# Patient Record
Sex: Female | Born: 1990 | Race: White | Hispanic: No | Marital: Married | Smoking: Former smoker
Health system: Southern US, Community
[De-identification: ages and names within clinical notes are randomized; demographics above are authoritative.]

## PROBLEM LIST (undated history)

## (undated) DIAGNOSIS — M419 Scoliosis, unspecified: Secondary | ICD-10-CM

## (undated) DIAGNOSIS — M810 Age-related osteoporosis without current pathological fracture: Secondary | ICD-10-CM

## (undated) DIAGNOSIS — R569 Unspecified convulsions: Secondary | ICD-10-CM

## (undated) DIAGNOSIS — J45909 Unspecified asthma, uncomplicated: Secondary | ICD-10-CM

## (undated) DIAGNOSIS — G43909 Migraine, unspecified, not intractable, without status migrainosus: Secondary | ICD-10-CM

## (undated) DIAGNOSIS — R011 Cardiac murmur, unspecified: Secondary | ICD-10-CM

## (undated) DIAGNOSIS — Q860 Fetal alcohol syndrome (dysmorphic): Secondary | ICD-10-CM

## (undated) DIAGNOSIS — F419 Anxiety disorder, unspecified: Secondary | ICD-10-CM

## (undated) DIAGNOSIS — F32A Depression, unspecified: Secondary | ICD-10-CM

## (undated) HISTORY — DX: Depression, unspecified: F32.A

## (undated) HISTORY — DX: Anxiety disorder, unspecified: F41.9

## (undated) HISTORY — DX: Age-related osteoporosis without current pathological fracture: M81.0

## (undated) HISTORY — PX: WRIST SURGERY: SHX841

## (undated) HISTORY — DX: Scoliosis, unspecified: M41.9

## (undated) HISTORY — DX: Fetal alcohol syndrome (dysmorphic): Q86.0

## (undated) HISTORY — DX: Unspecified asthma, uncomplicated: J45.909

## (undated) HISTORY — PX: FRACTURE SURGERY: SHX138

## (undated) HISTORY — DX: Cardiac murmur, unspecified: R01.1

## (undated) HISTORY — DX: Unspecified convulsions: R56.9

## (undated) HISTORY — DX: Migraine, unspecified, not intractable, without status migrainosus: G43.909

## (undated) HISTORY — PX: TONSILECTOMY, ADENOIDECTOMY, BILATERAL MYRINGOTOMY AND TUBES: SHX2538

## (undated) HISTORY — PX: HAND SURGERY: SHX662

---

## 2018-12-28 DIAGNOSIS — Z8659 Personal history of other mental and behavioral disorders: Secondary | ICD-10-CM | POA: Insufficient documentation

## 2018-12-28 DIAGNOSIS — F909 Attention-deficit hyperactivity disorder, unspecified type: Secondary | ICD-10-CM | POA: Insufficient documentation

## 2019-10-12 ENCOUNTER — Encounter: Payer: Self-pay | Admitting: Family Medicine

## 2019-10-12 ENCOUNTER — Ambulatory Visit (INDEPENDENT_AMBULATORY_CARE_PROVIDER_SITE_OTHER): Payer: PPO | Admitting: Family Medicine

## 2019-10-12 ENCOUNTER — Other Ambulatory Visit: Payer: Self-pay

## 2019-10-12 VITALS — BP 116/67 | HR 74 | Temp 98.3°F | Ht <= 58 in | Wt 96.0 lb

## 2019-10-12 DIAGNOSIS — M810 Age-related osteoporosis without current pathological fracture: Secondary | ICD-10-CM | POA: Diagnosis not present

## 2019-10-12 DIAGNOSIS — J4540 Moderate persistent asthma, uncomplicated: Secondary | ICD-10-CM | POA: Diagnosis not present

## 2019-10-12 DIAGNOSIS — F419 Anxiety disorder, unspecified: Secondary | ICD-10-CM

## 2019-10-12 DIAGNOSIS — Z7689 Persons encountering health services in other specified circumstances: Secondary | ICD-10-CM

## 2019-10-12 MED ORDER — ALBUTEROL SULFATE HFA 108 (90 BASE) MCG/ACT IN AERS: 2.0000 | INHALATION_SPRAY | Freq: Four times a day (QID) | RESPIRATORY_TRACT | 11 refills | Status: DC | PRN

## 2019-10-12 MED ORDER — BUDESONIDE-FORMOTEROL FUMARATE 160-4.5 MCG/ACT IN AERO
2.0000 | INHALATION_SPRAY | Freq: Two times a day (BID) | RESPIRATORY_TRACT | 11 refills | Status: DC
Start: 2019-10-12 — End: 2020-11-11

## 2019-10-12 NOTE — Progress Notes (Signed)
BP 116/67   Pulse 74   Temp 98.3 F (36.8 C) (Oral)   Ht 4\' 9"  (1.448 m)   Wt 96 lb (43.5 kg)   SpO2 99%   BMI 20.77 kg/m    Subjective:    Patient ID: , female    DOB: 03/12/1991, 29 y.o.   MRN: [id number]  HPI: Stacy Taylor is a 29 y.o. female  Chief Complaint  Patient presents with  . Establish Care  . Referral     to endocrinologist   Presenting today to establish care.  Osteoporosis - dianosed last year after falling and shattering both bones in left wrist. Prior to that had fractured the right wrist in a fall. Had DXA last year after fracture which was the time of dx. Needing referral to Endocrinology. Has been on forteo daily for about a year now. Due for recheck DXA in 1 year.   Had pap smear last year through her previous Gynecologist. Has IUD in place.   Has seasons where her asthma is worse than others. Uses symbicort and prn albuterol.   Hx of anxiety, on gabapentin and hydroxyzine prn. This is followed by Psychiatry.   Depression screen PHQ 2/9 10/12/2019  Decreased Interest 0  Down, Depressed, Hopeless 1  PHQ - 2 Score 1  Altered sleeping 3  Tired, decreased energy 1  Change in appetite 3  Feeling bad or failure about yourself  2  Trouble concentrating 3  Moving slowly or fidgety/restless 2  Suicidal thoughts 0  PHQ-9 Score 15  Difficult doing work/chores Somewhat difficult   GAD 7 : Generalized Anxiety Score 10/12/2019  Nervous, Anxious, on Edge 3  Control/stop worrying 2  Worry too much - different things 3  Trouble relaxing 1  Restless 2  Easily annoyed or irritable 2  Afraid - awful might happen 3  Total GAD 7 Score 16  Anxiety Difficulty Somewhat difficult   Relevant past medical, surgical, family and social history reviewed and updated as indicated. Interim medical history since our last visit reviewed. Allergies and medications reviewed and updated.  Review of Systems  Per HPI unless specifically indicated above       Objective:    BP 116/67   Pulse 74   Temp 98.3 F (36.8 C) (Oral)   Ht 4\' 9"  (1.448 m)   Wt 96 lb (43.5 kg)   SpO2 99%   BMI 20.77 kg/m   Wt Readings from Last 3 Encounters:  10/12/19 96 lb (43.5 kg)    Physical Exam Vitals and nursing note reviewed.  Constitutional:      Appearance: Normal appearance. She is not ill-appearing.  HENT:     Head: Atraumatic.  Eyes:     Extraocular Movements: Extraocular movements intact.     Conjunctiva/sclera: Conjunctivae normal.  Cardiovascular:     Rate and Rhythm: Normal rate and regular rhythm.     Heart sounds: Normal heart sounds.  Pulmonary:     Effort: Pulmonary effort is normal.     Breath sounds: Normal breath sounds.  Musculoskeletal:        General: Normal range of motion.     Cervical back: Normal range of motion and neck supple.  Skin:    General: Skin is warm and dry.  Neurological:     Mental Status: She is alert and oriented to person, place, and time.  Psychiatric:        Mood and Affect: Mood normal.  Thought Content: Thought content normal.        Judgment: Judgment normal.     No results found for this or any previous visit.    Assessment & Plan:   Problem List Items Addressed This Visit      Respiratory   Asthma    Stable and well controlled, continue inhaler regimen      Relevant Medications   budesonide-formoterol (SYMBICORT) 160-4.5 MCG/ACT inhaler   albuterol (VENTOLIN HFA) 108 (90 Base) MCG/ACT inhaler     Musculoskeletal and Integument   Osteoporosis without current pathological fracture - Primary    Referral placed to establish with local Endocrinologist to manage her forteo injections       Relevant Medications   Teriparatide, Recombinant, (FORTEO) 620 MCG/2.48ML SOPN   Other Relevant Orders   Ambulatory referral to Endocrinology     Other   Anxiety   Relevant Medications   hydrOXYzine (ATARAX/VISTARIL) 50 MG tablet    Other Visit Diagnoses    Encounter to establish care            Follow up plan: Return for CPE.

## 2019-10-15 ENCOUNTER — Encounter: Payer: Self-pay | Admitting: Family Medicine

## 2019-10-17 DIAGNOSIS — J45909 Unspecified asthma, uncomplicated: Secondary | ICD-10-CM | POA: Insufficient documentation

## 2019-10-17 DIAGNOSIS — M810 Age-related osteoporosis without current pathological fracture: Secondary | ICD-10-CM | POA: Insufficient documentation

## 2019-10-17 DIAGNOSIS — F419 Anxiety disorder, unspecified: Secondary | ICD-10-CM | POA: Insufficient documentation

## 2019-10-17 NOTE — Assessment & Plan Note (Signed)
Stable and well controlled, continue inhaler regimen

## 2019-10-17 NOTE — Assessment & Plan Note (Signed)
Referral placed to establish with local Endocrinologist to manage her forteo injections

## 2019-10-24 ENCOUNTER — Telehealth: Payer: Self-pay | Admitting: Family Medicine

## 2019-10-24 NOTE — Telephone Encounter (Signed)
[facility] sent fax and needs Bone density report/Most Recent Vitamin D/Met B/CMP labs. I do not see any of these in the chart. Please let me know what I need to do next. Thanks.

## 2019-10-25 NOTE — Telephone Encounter (Signed)
It looks like she is a new patient with [place], and that she has been diagnosed with this in the past, they may be able to get these records from her previous doctor, but I do not think we have access to any of it.

## 2019-11-04 ENCOUNTER — Encounter: Payer: Self-pay | Admitting: Family Medicine

## 2019-11-06 ENCOUNTER — Encounter: Payer: Self-pay | Admitting: Family Medicine

## 2019-11-06 ENCOUNTER — Telehealth (INDEPENDENT_AMBULATORY_CARE_PROVIDER_SITE_OTHER): Payer: PPO | Admitting: Family Medicine

## 2019-11-06 DIAGNOSIS — G43009 Migraine without aura, not intractable, without status migrainosus: Secondary | ICD-10-CM

## 2019-11-06 MED ORDER — RIZATRIPTAN BENZOATE 10 MG PO TABS
10.0000 mg | ORAL_TABLET | ORAL | 1 refills | Status: DC | PRN
Start: 2019-11-06 — End: 2021-04-29

## 2019-11-06 NOTE — Progress Notes (Signed)
Temp 97.6 F (36.4 C) (Temporal)   Wt 95 lb (43.1 kg)   BMI 20.56 kg/m    Subjective:    Patient ID: Stacy Taylor, female    DOB: 03/15/1991, 29 y.o.   MRN: [id number]  HPI: Stacy Taylor is a 29 y.o. female  Chief Complaint  Patient presents with  . Migraine   . This visit was completed via MyChart due to the restrictions of the COVID-19 pandemic. All issues as above were discussed and addressed. Physical exam was done as above through visual confirmation on MyChart. If it was felt that the patient should be evaluated in the office, they were directed there. The patient verbally consented to this visit. . Location of the patient: home . Location of the provider: work . Those involved with this call:  . Provider: Roosvelt Maser, PA-C . CMA: Elton Sin, CMA . Front Desk/Registration: Adela Ports  . Time spent on call: 15 minutes with patient face to face via video conference. More than 50% of this time was spent in counseling and coordination of care. 5 minutes total spent in review of patient's record and preparation of their chart. I verified patient identity using two factors (patient name and date of birth). Patient consents verbally to being seen via telemedicine visit today.   Hx of migraines, always sporadic and not lasting more than a day or so but severe when occurring. Had never gone to doctor for them because they hadn't lasted more than a day or so at a time. Current episode severe x 2 days with sound and light sensitivity, lightheadedness. Pain is front left side of face. Tried tylenol, gabapentin without relief.   Relevant past medical, surgical, family and social history reviewed and updated as indicated. Interim medical history since our last visit reviewed. Allergies and medications reviewed and updated.  Review of Systems  Per HPI unless specifically indicated above     Objective:    Temp 97.6 F (36.4 C) (Temporal)   Wt 95 lb (43.1 kg)   BMI 20.56  kg/m   Wt Readings from Last 3 Encounters:  11/06/19 95 lb (43.1 kg)  10/12/19 96 lb (43.5 kg)    Physical Exam Vitals and nursing note reviewed.  Constitutional:      General: She is not in acute distress.    Appearance: Normal appearance.  HENT:     Head: Atraumatic.     Right Ear: External ear normal.     Left Ear: External ear normal.     Nose: Nose normal. No congestion.     Mouth/Throat:     Mouth: Mucous membranes are moist.     Pharynx: Oropharynx is clear. No posterior oropharyngeal erythema.  Eyes:     Extraocular Movements: Extraocular movements intact.     Conjunctiva/sclera: Conjunctivae normal.  Cardiovascular:     Comments: Unable to assess via virtual visit Pulmonary:     Effort: Pulmonary effort is normal. No respiratory distress.  Musculoskeletal:        General: Normal range of motion.     Cervical back: Normal range of motion.  Skin:    General: Skin is dry.     Findings: No erythema.  Neurological:     Mental Status: She is alert and oriented to person, place, and time.  Psychiatric:        Mood and Affect: Mood normal.        Thought Content: Thought content normal.        Judgment:  Judgment normal.     No results found for this or any previous visit.    Assessment & Plan:   Problem List Items Addressed This Visit      Cardiovascular and Mediastinum   Migraines    Prn maxalt sent, if this not helping significantly with sxs will follow up to discuss starting a preventative medication. Return precautions given      Relevant Medications   rizatriptan (MAXALT) 10 MG tablet       Follow up plan: Return if symptoms worsen or fail to improve.

## 2019-11-12 ENCOUNTER — Encounter: Payer: Self-pay | Admitting: Family Medicine

## 2019-11-12 DIAGNOSIS — G43909 Migraine, unspecified, not intractable, without status migrainosus: Secondary | ICD-10-CM | POA: Insufficient documentation

## 2019-11-12 NOTE — Assessment & Plan Note (Signed)
Prn maxalt sent, if this not helping significantly with sxs will follow up to discuss starting a preventative medication. Return precautions given

## 2019-11-16 ENCOUNTER — Ambulatory Visit (INDEPENDENT_AMBULATORY_CARE_PROVIDER_SITE_OTHER): Payer: PPO | Admitting: Family Medicine

## 2019-11-16 ENCOUNTER — Other Ambulatory Visit: Payer: Self-pay

## 2019-11-16 ENCOUNTER — Encounter: Payer: Self-pay | Admitting: Family Medicine

## 2019-11-16 VITALS — BP 86/61 | HR 74 | Temp 98.4°F | Wt 95.0 lb

## 2019-11-16 DIAGNOSIS — M79644 Pain in right finger(s): Secondary | ICD-10-CM | POA: Diagnosis not present

## 2019-11-16 NOTE — Progress Notes (Signed)
   BP (!) 86/61   Pulse 74   Temp 98.4 F (36.9 C) (Oral)   Wt 95 lb (43.1 kg)   SpO2 99%   BMI 20.56 kg/m    Subjective:    Patient ID: Stacy Taylor, female    DOB: 16-Jun-1990, 29 y.o.   MRN: [id number]  HPI: Stacy Taylor is a 29 y.o. female  Chief Complaint  Patient presents with  . Pain    right pinky. 2 days ago   Presenting today with several days of right pinky discoloration, swelling and severe pain. Initially was turning black purple and red with pain originating from fingernail. She finally soaked the finger in acetone last night and ripped the painful portion of nail off that had been bothering her. Woke up this morning and the finger was nearly back to normal with minimal pain, no discoloration and mild swelling. Denies fever, chills, sweats.   Relevant past medical, surgical, family and social history reviewed and updated as indicated. Interim medical history since our last visit reviewed. Allergies and medications reviewed and updated.  Review of Systems  Per HPI unless specifically indicated above     Objective:    BP (!) 86/61   Pulse 74   Temp 98.4 F (36.9 C) (Oral)   Wt 95 lb (43.1 kg)   SpO2 99%   BMI 20.56 kg/m   Wt Readings from Last 3 Encounters:  11/16/19 95 lb (43.1 kg)  11/06/19 95 lb (43.1 kg)  10/12/19 96 lb (43.5 kg)    Physical Exam Vitals and nursing note reviewed.  Constitutional:      Appearance: Normal appearance. She is not ill-appearing.  HENT:     Head: Atraumatic.  Eyes:     Extraocular Movements: Extraocular movements intact.     Conjunctiva/sclera: Conjunctivae normal.  Cardiovascular:     Rate and Rhythm: Normal rate and regular rhythm.     Pulses: Normal pulses.     Heart sounds: Normal heart sounds.     Comments: Normal capillary refill b/l UEs Pulmonary:     Effort: Pulmonary effort is normal.     Breath sounds: Normal breath sounds.  Musculoskeletal:        General: Normal range of motion.     Cervical  back: Normal range of motion and neck supple.  Skin:    General: Skin is warm and dry.     Comments: Distal portion of right 5th digit removed, no active discoloration, open wound, drainage. Minimally edematous and ttp  Neurological:     Mental Status: She is alert and oriented to person, place, and time.  Psychiatric:        Mood and Affect: Mood normal.        Thought Content: Thought content normal.        Judgment: Judgment normal.     No results found for this or any previous visit.    Assessment & Plan:   Problem List Items Addressed This Visit    None    Visit Diagnoses    Pain of finger of right hand    -  Primary   Much improved after removing problematic portion of nail at home. Warm epsom salt soaks, neosporin, elevate hand. F/u if worsening and will send over abx       Follow up plan: Return if symptoms worsen or fail to improve.

## 2019-11-29 ENCOUNTER — Telehealth: Payer: Self-pay | Admitting: Psychiatry

## 2019-12-13 DIAGNOSIS — E559 Vitamin D deficiency, unspecified: Secondary | ICD-10-CM | POA: Diagnosis not present

## 2019-12-13 DIAGNOSIS — M818 Other osteoporosis without current pathological fracture: Secondary | ICD-10-CM | POA: Diagnosis not present

## 2019-12-14 DIAGNOSIS — M810 Age-related osteoporosis without current pathological fracture: Secondary | ICD-10-CM | POA: Diagnosis not present

## 2019-12-18 ENCOUNTER — Encounter: Payer: Self-pay | Admitting: Psychiatry

## 2019-12-18 ENCOUNTER — Other Ambulatory Visit: Payer: Self-pay

## 2019-12-18 ENCOUNTER — Telehealth (INDEPENDENT_AMBULATORY_CARE_PROVIDER_SITE_OTHER): Payer: PPO | Admitting: Psychiatry

## 2019-12-18 ENCOUNTER — Telehealth: Payer: Self-pay

## 2019-12-18 DIAGNOSIS — Z9189 Other specified personal risk factors, not elsewhere classified: Secondary | ICD-10-CM | POA: Diagnosis not present

## 2019-12-18 DIAGNOSIS — F250 Schizoaffective disorder, bipolar type: Secondary | ICD-10-CM | POA: Diagnosis not present

## 2019-12-18 DIAGNOSIS — F419 Anxiety disorder, unspecified: Secondary | ICD-10-CM

## 2019-12-18 DIAGNOSIS — F17200 Nicotine dependence, unspecified, uncomplicated: Secondary | ICD-10-CM

## 2019-12-18 DIAGNOSIS — Z79899 Other long term (current) drug therapy: Secondary | ICD-10-CM

## 2019-12-18 MED ORDER — HYDROXYZINE HCL 50 MG PO TABS: 50.0000 mg | ORAL_TABLET | Freq: Every day | ORAL | 1 refills | Status: DC

## 2019-12-18 MED ORDER — QUETIAPINE FUMARATE 25 MG PO TABS: 25.0000 mg | ORAL_TABLET | Freq: Every day | ORAL | 1 refills | Status: DC
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
## 2019-12-18 NOTE — Patient Instructions (Signed)
Quetiapine tablets What is this medicine? QUETIAPINE (kwe TYE a peen) is an antipsychotic. It is used to treat schizophrenia and bipolar disorder, also known as manic-depression. This medicine may be used for other purposes; ask your health care provider or pharmacist if you have questions. COMMON BRAND NAME(S): Seroquel What should I tell my health care provider before I take this medicine? They need to know if you have any of these conditions:  blockage in your bowel  cataracts  constipation  dehydration  diabetes  difficulty swallowing  glaucoma  heart disease  history of breast cancer  kidney disease  liver disease  low blood counts, like low white cell, platelet, or red cell counts  low blood pressure or dizziness when standing up  Parkinson's disease  previous heart attack  prostate disease  seizures  stomach or intestine problems  suicidal thoughts, plans or attempt; a previous suicide attempt by you or a family member  thyroid disease  trouble passing urine  an unusual or allergic reaction to quetiapine, other medicines, foods, dyes, or preservatives  pregnant or trying to get pregnant  breast-feeding How should I use this medicine? Take this medicine by mouth. Swallow it with a drink of water. Follow the directions on the prescription label. If it upsets your stomach you can take it with food. Take your medicine at regular intervals. Do not take it more often than directed. Do not stop taking except on the advice of your doctor or health care professional. A special MedGuide will be given to you by the pharmacist with each prescription and refill. Be sure to read this information carefully each time. Talk to your pediatrician regarding the use of this medicine in children. While this drug may be prescribed for children as young as 10 years for selected conditions, precautions do apply. Patients over age 65 years may have a stronger reaction to this  medicine and need smaller doses. Overdosage: If you think you have taken too much of this medicine contact a poison control center or emergency room at once. NOTE: This medicine is only for you. Do not share this medicine with others. What if I miss a dose? If you miss a dose, take it as soon as you can. If it is almost time for your next dose, take only that dose. Do not take double or extra doses. What may interact with this medicine? Do not take this medicine with any of the following medications:  cisapride  dronedarone  fluconazole  metoclopramide  pimozide  posaconazole  thioridazine This medicine may also interact with the following medications:  alcohol  antihistamines for allergy cough and cold  antiviral medicines for HIV or AIDS  atropine  certain medicines for bladder problems like oxybutynin, tolterodine  certain medicines for blood pressure  certain medicines for depression, anxiety, or psychotic disturbances  certain medicines for diabetes  certain medicines for stomach problems like dicyclomine, hyoscyamine  certain medicines for travel sickness like scopolamine  certain medicines for Parkinson's disease  certain medicines for seizures like carbamazepine, phenobarbital, phenytoin  cimetidine  erythromycin  ipratropium  other medicines that prolong the QT interval (cause an abnormal heart rhythm) like dofetilide  rifampin  steroid medicines like prednisone or cortisone This list may not describe all possible interactions. Give your health care provider a list of all the medicines, herbs, non-prescription drugs, or dietary supplements you use. Also tell them if you smoke, drink alcohol, or use illegal drugs. Some items may interact with your medicine. What   should I watch for while using this medicine? Visit your health care professional for regular checks on your progress. Tell your health care professional if symptoms do not start to get  better or if they get worse. Do not stop taking except on your health care professional's advice. You may develop a severe reaction. Your health care professional will tell you how much medicine to take. You may need to have an eye exam before and during use of this medicine. This medicine may increase blood sugar. Ask your health care provider if changes in diet or medicines are needed if you have diabetes. Patients and their families should watch out for new or worsening depression or thoughts of suicide. Also watch out for sudden or severe changes in feelings such as feeling anxious, agitated, panicky, irritable, hostile, aggressive, impulsive, severely restless, overly excited and hyperactive, or not being able to sleep. If this happens, especially at the beginning of antidepressant treatment or after a change in dose, call your health care professional. You may get dizzy or drowsy. Do not drive, use machinery, or do anything that needs mental alertness until you know how this medicine affects you. Do not stand or sit up quickly, especially if you are an older patient. This reduces the risk of dizzy or fainting spells. Alcohol may interfere with the effect of this medicine. Avoid alcoholic drinks. This drug can cause problems with controlling your body temperature. It can lower the response of your body to cold temperatures. If possible, stay indoors during cold weather. If you must go outdoors, wear warm clothes. It can also lower the response of your body to heat. Do not overheat. Do not over-exercise. Stay out of the sun when possible. If you must be in the sun, wear cool clothing. Drink plenty of water. If you have trouble controlling your body temperature, call your health care provider right away. What side effects may I notice from receiving this medicine? Side effects that you should report to your doctor or health care professional as soon as possible:  allergic reactions like skin rash,  itching or hives, swelling of the face, lips, or tongue  breathing problems  changes in vision  confusion  elevated mood, decreased need for sleep, racing thoughts, impulsive behavior  eye pain  fast, irregular heartbeat  fever or chills, sore throat  inability to keep still  males: prolonged or painful erection  problems with balance, talking, walking  redness, blistering, peeling, or loosening of the skin, including inside the mouth  seizures  signs and symptoms of high blood sugar such as being more thirsty or hungry or having to urinate more than normal. You may also feel very tired or have blurry vision  signs and symptoms of hypothyroidism like fatigue; increased sensitivity to cold; weight gain; hoarseness; thinning hair  signs and symptoms of low blood pressure like dizziness; feeling faint or lightheaded; falls; unusually weak or tired  signs and symptoms of neuroleptic malignant syndrome like confusion; fast, irregular heartbeat; high fever; increased sweating; stiff muscles  sudden numbness or weakness of the face, arm, or leg  suicidal thoughts or other mood changes  trouble swallowing  uncontrollable movements of the arms, face, head, mouth, neck, or upper body Side effects that usually do not require medical attention (report to your doctor or health care professional if they continue or are bothersome):  change in sex drive or performance  constipation  drowsiness  dry mouth  upset stomach  weight gain This list may   not describe all possible side effects. Call your doctor for medical advice about side effects. You may report side effects to FDA at 1-800-FDA-1088. Where should I keep my medicine? Keep out of the reach of children. Store at room temperature between 15 and 30 degrees C (59 and 86 degrees F). Throw away any unused medicine after the expiration date. NOTE: This sheet is a summary. It may not cover all possible information. If you  have questions about this medicine, talk to your doctor, pharmacist, or health care provider.  2020 Elsevier/Gold Standard (2019-02-08 11:59:11)  

## 2019-12-18 NOTE — Telephone Encounter (Signed)
lab work orders faxed and confirmed.  

## 2020-02-08 ENCOUNTER — Other Ambulatory Visit: Payer: Self-pay

## 2020-02-08 ENCOUNTER — Telehealth (INDEPENDENT_AMBULATORY_CARE_PROVIDER_SITE_OTHER): Payer: PPO | Admitting: Psychiatry

## 2020-02-08 ENCOUNTER — Encounter: Payer: Self-pay | Admitting: Psychiatry

## 2020-02-08 DIAGNOSIS — F419 Anxiety disorder, unspecified: Secondary | ICD-10-CM

## 2020-02-08 DIAGNOSIS — Z79899 Other long term (current) drug therapy: Secondary | ICD-10-CM

## 2020-02-08 DIAGNOSIS — Z9189 Other specified personal risk factors, not elsewhere classified: Secondary | ICD-10-CM

## 2020-02-08 DIAGNOSIS — F250 Schizoaffective disorder, bipolar type: Secondary | ICD-10-CM | POA: Diagnosis not present

## 2020-02-08 DIAGNOSIS — F17200 Nicotine dependence, unspecified, uncomplicated: Secondary | ICD-10-CM

## 2020-02-08 MED ORDER — QUETIAPINE FUMARATE 50 MG PO TABS: 50.0000 mg | ORAL_TABLET | Freq: Every day | ORAL | 0 refills | Status: DC

## 2020-02-08 MED ORDER — GABAPENTIN 400 MG PO CAPS: 400.0000 mg | ORAL_CAPSULE | Freq: Three times a day (TID) | ORAL | 0 refills | Status: DC

## 2020-02-08 MED ORDER — HYDROXYZINE HCL 50 MG PO TABS: 50.0000 mg | ORAL_TABLET | Freq: Every day | ORAL | 0 refills | Status: DC

## 2020-02-08 NOTE — Progress Notes (Signed)
Provider Location : [facility] Patient Location : Home  Participants: Patient , Provider  Virtual Visit via Video Note  I connected with Stacy Taylor on 02/08/20 at  2:00 PM EDT by a video enabled telemedicine application and verified that I am speaking with the correct person using two identifiers.   I discussed the limitations of evaluation and management by telemedicine and the availability of in person appointments. The patient expressed understanding and agreed to proceed.      I discussed the assessment and treatment plan with the patient. The patient was provided an opportunity to ask questions and all were answered. The patient agreed with the plan and demonstrated an understanding of the instructions.   The patient was advised to call back or seek an in-person evaluation if the symptoms worsen or if the condition fails to improve as anticipated.   BH MD OP Progress Note  02/08/2020 2:20 PM Shemiah Rosch  MRN:  [id number]  Chief Complaint:  Chief Complaint    Follow-up     HPI: Stacy Taylor is a 29 year old Caucasian female, recently relocated to [place] from [place], lives in [place], has a history of schizoaffective disorder bipolar type, anxiety disorder, migraine headaches, was evaluated by telemedicine today.   Patient today reports she is currently doing well with regards to her mood.  Denies any significant mood lability.  Denies any anxiety symptoms.  She describes her mood as happy.  She reports she recently got married and had a very good wedding.  Her whole family participated and she had a good time.  She reports sleep is better on the Seroquel.  She however reports she continues to wake up a few times at night and is interested in increasing her dosage.  Denies side effects to Seroquel.  She continues to have auditory hallucinations which are chronic.  She however reports she has gotten so used to it it does not bother her much.  She denies any  suicidality, homicidality or perceptual disturbances.  Patient reports she had a seizure recently.  This was observed by her husband.  She reports she never got a call from neurology.  She also has been noncompliant with the EKG as well as labs however agrees to get it done.  She has been cutting back on smoking cigarettes and is motivated to continue to do so.  Patient denies any other concerns today.   Visit Diagnosis:    ICD-10-CM   1. Schizoaffective disorder, bipolar type (HCC)  F25.0 QUEtiapine (SEROQUEL) 50 MG tablet    hydrOXYzine (ATARAX/VISTARIL) 50 MG tablet    gabapentin (NEURONTIN) 400 MG capsule  2. Anxiety disorder, unspecified type  F41.9 gabapentin (NEURONTIN) 400 MG capsule  3. Tobacco use disorder  F17.200   4. High risk medication use  Z79.899   5. At risk for long QT syndrome  Z91.89     Past Psychiatric History: I have reviewed past psychiatric history from my progress note on 12/18/2019.  Past trials of Zyprexa, gabapentin, multiple other medications-does not remember names.  Past Medical History:  Past Medical History:  Diagnosis Date  . Anxiety   . Asthma   . Depression   . Fetal alcohol syndrome   . Migraines   . Osteoporosis   . Scoliosis   . Seizures (HCC)     Past Surgical History:  Procedure Laterality Date  . FRACTURE SURGERY    . HAND SURGERY    . TONSILECTOMY, ADENOIDECTOMY, BILATERAL MYRINGOTOMY AND TUBES    .  WRIST SURGERY      Family Psychiatric History: I have reviewed family psychiatric history from my progress note on 12/18/2019  Family History:  Family History  Problem Relation Age of Onset  . Drug abuse Other   . Mental illness Other     Social History: I have reviewed social history from my progress note on 12/18/2019 Social History   Socioeconomic History  . Marital status: Single    Spouse name: Not on file  . Number of children: Not on file  . Years of education: Not on file  . Highest education level: Not on file   Occupational History  . Not on file  Tobacco Use  . Smoking status: Current Every Day Smoker    Packs/day: 0.25    Types: Cigarettes  . Smokeless tobacco: Never Used  . Tobacco comment: cutting back now- reported on 12/18/2019  Vaping Use  . Vaping Use: Never used  Substance and Sexual Activity  . Alcohol use: Yes    Comment: socially  . Drug use: Never  . Sexual activity: Yes  Other Topics Concern  . Not on file  Social History Narrative  . Not on file   Social Determinants of Health   Financial Resource Strain:   . Difficulty of Paying Living Expenses: Not on file  Food Insecurity:   . Worried About Programme researcher, broadcasting/film/videounning Out of Food in the Last Year: Not on file  . Ran Out of Food in the Last Year: Not on file  Transportation Needs:   . Lack of Transportation (Medical): Not on file  . Lack of Transportation (Non-Medical): Not on file  Physical Activity:   . Days of Exercise per Week: Not on file  . Minutes of Exercise per Session: Not on file  Stress:   . Feeling of Stress : Not on file  Social Connections:   . Frequency of Communication with Friends and Family: Not on file  . Frequency of Social Gatherings with Friends and Family: Not on file  . Attends Religious Services: Not on file  . Active Member of Clubs or Organizations: Not on file  . Attends BankerClub or Organization Meetings: Not on file  . Marital Status: Not on file    Allergies:  Allergies  Allergen Reactions  . Ibuprofen     Abdominal pain, upset stomach    Metabolic Disorder Labs: No results found for: HGBA1C, MPG No results found for: PROLACTIN No results found for: CHOL, TRIG, HDL, CHOLHDL, VLDL, LDLCALC No results found for: TSH  Therapeutic Level Labs: No results found for: LITHIUM No results found for: VALPROATE No components found for:  CBMZ  Current Medications: Current Outpatient Medications  Medication Sig Dispense Refill  . albuterol (VENTOLIN HFA) 108 (90 Base) MCG/ACT inhaler Inhale 2 puffs  into the lungs every 6 (six) hours as needed for wheezing or shortness of breath. 18 g 11  . budesonide-formoterol (SYMBICORT) 160-4.5 MCG/ACT inhaler Inhale 2 puffs into the lungs 2 (two) times daily. 1 Inhaler 11  . gabapentin (NEURONTIN) 400 MG capsule Take 1 capsule (400 mg total) by mouth 3 (three) times daily. 270 capsule 0  . hydrOXYzine (ATARAX/VISTARIL) 50 MG tablet Take 1 tablet (50 mg total) by mouth daily. At bed time 90 tablet 0  . levonorgestrel (LILETTA, 52 MG,) 19.5 MCG/DAY IUD IUD 1 each by Intrauterine route once.    . loratadine (CLARITIN) 10 MG tablet Claritin 10 mg tablet  Take 1 tablet every day by oral route as needed.    .Marland Kitchen  QUEtiapine (SEROQUEL) 50 MG tablet Take 1 tablet (50 mg total) by mouth at bedtime. 90 tablet 0  . rizatriptan (MAXALT) 10 MG tablet Take 1 tablet (10 mg total) by mouth as needed for migraine. May repeat in 2 hours if needed 10 tablet 1  . Teriparatide, Recombinant, (FORTEO) 620 MCG/2.48ML SOPN Inject into the skin daily. Injections    . Vitamin D, Ergocalciferol, (DRISDOL) 1.25 MG (50000 UNIT) CAPS capsule Take 50,000 Units by mouth once a week.     No current facility-administered medications for this visit.     Musculoskeletal: Strength & Muscle Tone: UTA Gait & Station: normal Patient leans: N/A  Psychiatric Specialty Exam: Review of Systems  Neurological: Positive for seizures.  Psychiatric/Behavioral: Positive for sleep disturbance.  All other systems reviewed and are negative.   There were no vitals taken for this visit.There is no height or weight on file to calculate BMI.  General Appearance: Casual  Eye Contact:  Fair  Speech:  Clear and Coherent  Volume:  Normal  Mood:  Euthymic  Affect:  Congruent  Thought Process:  Goal Directed and Descriptions of Associations: Intact  Orientation:  Full (Time, Place, and Person)  Thought Content: Hallucinations: Auditory chronic, conversations  Suicidal Thoughts:  No  Homicidal Thoughts:   No  Memory:  Immediate;   Fair Recent;   Fair Remote;   Fair  Judgement:  Fair  Insight:  Fair  Psychomotor Activity:  Normal  Concentration:  Concentration: Fair and Attention Span: Fair  Recall:  [employer] of Knowledge: Fair  Language: Fair  Akathisia:  No  Handed:  Right  AIMS (if indicated): UTA  Assets:  Communication Skills Desire for Improvement Housing Social Support  ADL's:  Intact  Cognition: WNL  Sleep:  Poor improving   Screenings: GAD-7     Office Visit from 10/12/2019 in [facility]  Total GAD-7 Score 16    PHQ2-9     Office Visit from 10/12/2019 in [facility]  PHQ-2 Total Score 1  PHQ-9 Total Score 15       Assessment and Plan: Stacy Taylor is a 29 year old Caucasian female, engaged, on SSI, lives in [place], has a history of migraine headaches, osteoporosis, seizure disorder versus pseudoseizure, schizoaffective disorder, was evaluated by telemedicine today.  Patient is biologically predisposed given her family history of mental health problems.  Patient also with history of trauma.  Patient is currently making progress on the current medication regimen however continues to have auditory hallucinations which are chronic as well as sleep issues.  Plan as noted below.  Plan Schizoaffective disorder-improving Increase Seroquel to 50 mg p.o. nightly. Gabapentin 400 mg p.o. 3 times daily. This was initially started by her providers for seizures.  However patient advised to continue it as a mood stabilizer.  Anxiety disorder unspecified-improving We will monitor closely.  Tobacco use disorder-improving Provided smoking cessation counseling.  High risk medication use-pending labs-TSH, lipid panel, hemoglobin A1c, prolactin, CBC with differential.  Patient encouraged to get labs done.  At risk for QT syndrome-patient was advised to get EKG. patient encouraged to get EKG done.  Patient will be referred to neurology for seizure  disorder versus pseudoseizures.  Follow-up in clinic in 4 weeks or sooner if needed.   I have spent atleast 20 minutes face to face by video with patient today. More than 50 % of the time was spent for preparing to see the patient ( e.g., review of test, records ),  ordering medications  and test ,psychoeducation and supportive psychotherapy and care coordination,as well as documenting clinical information in electronic health record. This note was generated in part or whole with voice recognition software. Voice recognition is usually quite accurate but there are transcription errors that can and very often do occur. I apologize for any typographical errors that were not detected and corrected.        Jomarie Longs, MD 02/08/2020, 2:20 PM

## 2020-02-16 ENCOUNTER — Other Ambulatory Visit: Payer: Self-pay

## 2020-02-16 ENCOUNTER — Ambulatory Visit (INDEPENDENT_AMBULATORY_CARE_PROVIDER_SITE_OTHER): Payer: PPO | Admitting: Family Medicine

## 2020-02-16 ENCOUNTER — Encounter: Payer: Self-pay | Admitting: Family Medicine

## 2020-02-16 VITALS — BP 103/67 | HR 84 | Temp 98.8°F | Ht <= 58 in | Wt 89.8 lb

## 2020-02-16 DIAGNOSIS — Z136 Encounter for screening for cardiovascular disorders: Secondary | ICD-10-CM

## 2020-02-16 DIAGNOSIS — H6123 Impacted cerumen, bilateral: Secondary | ICD-10-CM

## 2020-02-16 DIAGNOSIS — H6982 Other specified disorders of Eustachian tube, left ear: Secondary | ICD-10-CM | POA: Diagnosis not present

## 2020-02-16 DIAGNOSIS — Z79899 Other long term (current) drug therapy: Secondary | ICD-10-CM

## 2020-02-16 LAB — BAYER DCA HB A1C WAIVED: HB A1C (BAYER DCA - WAIVED): 3.9 % (ref <7.0)

## 2020-02-16 NOTE — Progress Notes (Signed)
BP 103/67   Pulse 84   Temp 98.8 F (37.1 C) (Oral)   Ht '4\' 9"'  (1.448 m)   Wt 89 lb 12.8 oz (40.7 kg)   SpO2 100%   BMI 19.43 kg/m    Subjective:    Patient ID: Stacy Taylor, female    DOB: 1990/06/18, 29 y.o.   MRN: [id number]  HPI: Lameeka Schleifer is a 29 y.o. female  Chief Complaint  Patient presents with  . Ear Pain    Left Ear pain. X 1.5 weeks. Tried ear drops and peroxide. Headaches 2 x a day - and constant tinnitis.   . Screening for Psych Meds    EKG 112.    EAR PAIN Duration: 1.5 weeks Involved ear(s): left Severity:  mild  Quality:  Pressure  Fever: no Otorrhea: no Upper respiratory infection symptoms: no Pruritus: no Hearing loss: yes Water immersion no Using Q-tips: no Recurrent otitis media: no Status: stable Treatments attempted: peroxide, ear drop kit  Needs blood work and EKG for her psychiatrist. Ordered today. Will forward.  Relevant past medical, surgical, family and social history reviewed and updated as indicated. Interim medical history since our last visit reviewed. Allergies and medications reviewed and updated.  Review of Systems  HENT: Positive for ear pain and hearing loss. Negative for congestion, dental problem, drooling, ear discharge, facial swelling, mouth sores, nosebleeds, postnasal drip, rhinorrhea, sinus pressure, sinus pain, sneezing, sore throat, tinnitus, trouble swallowing and voice change.   Respiratory: Negative.   Cardiovascular: Negative.   Gastrointestinal: Negative.   Musculoskeletal: Negative.   Neurological: Negative.   Psychiatric/Behavioral: Negative.     Per HPI unless specifically indicated above     Objective:    BP 103/67   Pulse 84   Temp 98.8 F (37.1 C) (Oral)   Ht '4\' 9"'  (1.448 m)   Wt 89 lb 12.8 oz (40.7 kg)   SpO2 100%   BMI 19.43 kg/m   Wt Readings from Last 3 Encounters:  02/16/20 89 lb 12.8 oz (40.7 kg)  11/16/19 95 lb (43.1 kg)  11/06/19 95 lb (43.1 kg)    Physical Exam Vitals  and nursing note reviewed.  Constitutional:      General: She is not in acute distress.    Appearance: Normal appearance. She is not ill-appearing, toxic-appearing or diaphoretic.  HENT:     Head: Normocephalic and atraumatic.     Right Ear: External ear normal. There is impacted cerumen.     Left Ear: External ear normal. There is impacted cerumen.     Nose: Nose normal.     Mouth/Throat:     Mouth: Mucous membranes are moist.     Pharynx: Oropharynx is clear.  Eyes:     General: No scleral icterus.       Right eye: No discharge.        Left eye: No discharge.     Extraocular Movements: Extraocular movements intact.     Conjunctiva/sclera: Conjunctivae normal.     Pupils: Pupils are equal, round, and reactive to light.  Cardiovascular:     Rate and Rhythm: Normal rate and regular rhythm.     Pulses: Normal pulses.     Heart sounds: Normal heart sounds. No murmur heard.  No friction rub. No gallop.   Pulmonary:     Effort: Pulmonary effort is normal. No respiratory distress.     Breath sounds: Normal breath sounds. No stridor. No wheezing, rhonchi or rales.  Chest:  Chest wall: No tenderness.  Musculoskeletal:        General: Normal range of motion.     Cervical back: Normal range of motion and neck supple.  Skin:    General: Skin is warm and dry.     Capillary Refill: Capillary refill takes less than 2 seconds.     Coloration: Skin is not jaundiced or pale.     Findings: No bruising, erythema, lesion or rash.  Neurological:     General: No focal deficit present.     Mental Status: She is alert and oriented to person, place, and time. Mental status is at baseline.  Psychiatric:        Mood and Affect: Mood normal.        Behavior: Behavior normal.        Thought Content: Thought content normal.        Judgment: Judgment normal.     No results found for this or any previous visit.    Assessment & Plan:   Problem List Items Addressed This Visit    None    Visit  Diagnoses    Hearing loss due to cerumen impaction, bilateral    -  Primary   Ears flushed today with good results. Call with any concerns.    ETD (Eustachian tube dysfunction), left       If not 100% better by Monday will call in burst of prednisone. Call with any concerns.    Screening for heart disease       Needs EKG for her psychiatrist- done today.   Relevant Orders   EKG 12-Lead (Completed)   Long-term use of high-risk medication       Needs blood work for her psychiatrist- done today.   Relevant Orders   TSH   Lipid Panel w/o Chol/HDL Ratio   Bayer DCA Hb A1c Waived   CBC with Differential/Platelet   Comprehensive metabolic panel   Prolactin       Follow up plan: Return if symptoms worsen or fail to improve.

## 2020-02-16 NOTE — Progress Notes (Signed)
Interpreted by me today. NSR at 80bpm, No ST segment changes. QT 380. Will send copy to psychiatry

## 2020-02-17 LAB — COMPREHENSIVE METABOLIC PANEL
ALT: 10 IU/L (ref 0–32)
AST: 12 IU/L (ref 0–40)
Albumin/Globulin Ratio: 2.1 (ref 1.2–2.2)
Albumin: 4.7 g/dL (ref 3.9–5.0)
Alkaline Phosphatase: 73 IU/L (ref 44–121)
BUN/Creatinine Ratio: 26 — ABNORMAL HIGH (ref 9–23)
BUN: 20 mg/dL (ref 6–20)
Bilirubin Total: 0.8 mg/dL (ref 0.0–1.2)
CO2: 24 mmol/L (ref 20–29)
Calcium: 8.9 mg/dL (ref 8.7–10.2)
Chloride: 104 mmol/L (ref 96–106)
Creatinine, Ser: 0.78 mg/dL (ref 0.57–1.00)
GFR calc Af Amer: 118 mL/min/{1.73_m2} (ref >59)
GFR calc non Af Amer: 102 mL/min/{1.73_m2} (ref >59)
Globulin, Total: 2.2 g/dL (ref 1.5–4.5)
Glucose: 73 mg/dL (ref 65–99)
Potassium: 4 mmol/L (ref 3.5–5.2)
Sodium: 141 mmol/L (ref 134–144)
Total Protein: 6.9 g/dL (ref 6.0–8.5)

## 2020-02-17 LAB — LIPID PANEL W/O CHOL/HDL RATIO
Cholesterol, Total: 143 mg/dL (ref 100–199)
HDL: 48 mg/dL (ref >39)
LDL Chol Calc (NIH): 83 mg/dL (ref 0–99)
Triglycerides: 55 mg/dL (ref 0–149)
VLDL Cholesterol Cal: 12 mg/dL (ref 5–40)

## 2020-02-17 LAB — CBC WITH DIFFERENTIAL/PLATELET
Basophils Absolute: 0 10*3/uL (ref 0.0–0.2)
Basos: 1 %
EOS (ABSOLUTE): 0 10*3/uL (ref 0.0–0.4)
Eos: 0 %
Hematocrit: 40.4 % (ref 34.0–46.6)
Hemoglobin: 14.3 g/dL (ref 11.1–15.9)
Immature Grans (Abs): 0 10*3/uL (ref 0.0–0.1)
Immature Granulocytes: 1 %
Lymphocytes Absolute: 2.1 10*3/uL (ref 0.7–3.1)
Lymphs: 36 %
MCH: 32.9 pg (ref 26.6–33.0)
MCHC: 35.4 g/dL (ref 31.5–35.7)
MCV: 93 fL (ref 79–97)
Monocytes Absolute: 0.4 10*3/uL (ref 0.1–0.9)
Monocytes: 6 %
Neutrophils Absolute: 3.3 10*3/uL (ref 1.4–7.0)
Neutrophils: 56 %
Platelets: 128 10*3/uL — ABNORMAL LOW (ref 150–450)
RBC: 4.34 x10E6/uL (ref 3.77–5.28)
RDW: 12.8 % (ref 11.7–15.4)
WBC: 5.8 10*3/uL (ref 3.4–10.8)

## 2020-02-17 LAB — TSH: TSH: 0.785 u[IU]/mL (ref 0.450–4.500)

## 2020-02-17 LAB — PROLACTIN: Prolactin: 4 ng/mL — ABNORMAL LOW (ref 4.8–23.3)

## 2020-03-11 DIAGNOSIS — G43109 Migraine with aura, not intractable, without status migrainosus: Secondary | ICD-10-CM | POA: Diagnosis not present

## 2020-03-11 DIAGNOSIS — F445 Conversion disorder with seizures or convulsions: Secondary | ICD-10-CM | POA: Diagnosis not present

## 2020-03-11 DIAGNOSIS — R569 Unspecified convulsions: Secondary | ICD-10-CM | POA: Diagnosis not present

## 2020-03-14 ENCOUNTER — Telehealth (INDEPENDENT_AMBULATORY_CARE_PROVIDER_SITE_OTHER): Payer: PPO | Admitting: Psychiatry

## 2020-03-14 ENCOUNTER — Other Ambulatory Visit: Payer: Self-pay

## 2020-03-14 ENCOUNTER — Encounter: Payer: Self-pay | Admitting: Psychiatry

## 2020-03-14 DIAGNOSIS — F17200 Nicotine dependence, unspecified, uncomplicated: Secondary | ICD-10-CM | POA: Diagnosis not present

## 2020-03-14 DIAGNOSIS — F250 Schizoaffective disorder, bipolar type: Secondary | ICD-10-CM

## 2020-03-14 DIAGNOSIS — Z79899 Other long term (current) drug therapy: Secondary | ICD-10-CM | POA: Diagnosis not present

## 2020-03-14 MED ORDER — QUETIAPINE FUMARATE 50 MG PO TABS: 75.0000 mg | ORAL_TABLET | Freq: Every day | ORAL | 0 refills | Status: DC

## 2020-03-14 NOTE — Progress Notes (Signed)
Virtual Visit via Video Note  I connected with Stacy Taylor on 03/14/20 at  2:40 PM EST by a video enabled telemedicine application and verified that I am speaking with the correct person using two identifiers.  Location Provider Location : [facility] Patient Location : Home  Participants: Patient , Provider   I discussed the limitations of evaluation and management by telemedicine and the availability of in person appointments. The patient expressed understanding and agreed to proceed. I discussed the assessment and treatment plan with the patient. The patient was provided an opportunity to ask questions and all were answered. The patient agreed with the plan and demonstrated an understanding of the instructions.  The patient was advised to call back or seek an in-person evaluation if the symptoms worsen or if the condition fails to improve as anticipated.   BH MD OP Progress Note  03/14/2020 3:18 PM Stacy Taylor  MRN:  [id number]  Chief Complaint:  Chief Complaint    Follow-up     HPI: Stacy Taylor is a 29 year old Caucasian female, currently lives in [place], has a history of schizoaffective disorder, migraine headaches , seizure disorder was evaluated by telemedicine today.  Patient today reports that she has noticed mild depressive symptoms since the past few days.  She reports she usually has depressive episodes during the winter months.  When she was in [place] since the winter was colder  she used to get it earlier.  She reports however since being in [place] she has not had significant depressive symptoms and this is the first time she has felt some sadness and being down.  Patient reports sleep is good.  She denies any significant anxiety symptoms.  She is compliant on medications.  Denies side effects.  She denies any suicidality, homicidality or perceptual disturbances.  She had all her blood work-up as well as EKG completed.  Reviewed and discussed the same  with her.  She reports she is currently working at [employer] and reports work as going well.  She looks forward to Thanksgiving holidays and is planning to spend it with her family.  Visit Diagnosis:    ICD-10-CM   1. Schizoaffective disorder, bipolar type (HCC)  F25.0 QUEtiapine (SEROQUEL) 50 MG tablet  2. Tobacco use disorder  F17.200   3. High risk medication use  Z79.899     Past Psychiatric History: I have past psychiatric history from my progress note on 12/18/2019.  Past trials of Zyprexa, gabapentin, multiple other medications-does not remember names.  Past Medical History:  Past Medical History:  Diagnosis Date  . Anxiety   . Asthma   . Depression   . Fetal alcohol syndrome   . Migraines   . Osteoporosis   . Scoliosis   . Seizures (HCC)     Past Surgical History:  Procedure Laterality Date  . FRACTURE SURGERY    . HAND SURGERY    . TONSILECTOMY, ADENOIDECTOMY, BILATERAL MYRINGOTOMY AND TUBES    . WRIST SURGERY      Family Psychiatric History: I have reviewed family psychiatric history from my progress note on 12/18/2019  Family History:  Family History  Problem Relation Age of Onset  . Drug abuse Other   . Mental illness Other     Social History: Reviewed social history from my progress note on 12/18/2019 Social History   Socioeconomic History  . Marital status: Married    Spouse name: Not on file  . Number of children: Not on file  . Years of education:  Not on file  . Highest education level: Not on file  Occupational History  . Not on file  Tobacco Use  . Smoking status: Current Every Day Smoker    Packs/day: 0.25    Types: Cigarettes  . Smokeless tobacco: Never Used  . Tobacco comment: 1 cig per day reported 03/14/2020  Vaping Use  . Vaping Use: Never used  Substance and Sexual Activity  . Alcohol use: Yes    Comment: socially  . Drug use: Never  . Sexual activity: Yes  Other Topics Concern  . Not on file  Social History Narrative  . Not on  file   Social Determinants of Health   Financial Resource Strain:   . Difficulty of Paying Living Expenses: Not on file  Food Insecurity:   . Worried About Programme researcher, broadcasting/film/video in the Last Year: Not on file  . Ran Out of Food in the Last Year: Not on file  Transportation Needs:   . Lack of Transportation (Medical): Not on file  . Lack of Transportation (Non-Medical): Not on file  Physical Activity:   . Days of Exercise per Week: Not on file  . Minutes of Exercise per Session: Not on file  Stress:   . Feeling of Stress : Not on file  Social Connections:   . Frequency of Communication with Friends and Family: Not on file  . Frequency of Social Gatherings with Friends and Family: Not on file  . Attends Religious Services: Not on file  . Active Member of Clubs or Organizations: Not on file  . Attends Banker Meetings: Not on file  . Marital Status: Not on file    Allergies:  Allergies  Allergen Reactions  . Ibuprofen     Abdominal pain, upset stomach    Metabolic Disorder Labs: Lab Results  Component Value Date   HGBA1C 3.9 02/16/2020   Lab Results  Component Value Date   PROLACTIN 4.0 (L) 02/16/2020   Lab Results  Component Value Date   CHOL 143 02/16/2020   TRIG 55 02/16/2020   HDL 48 02/16/2020   LDLCALC 83 02/16/2020   Lab Results  Component Value Date   TSH 0.785 02/16/2020    Therapeutic Level Labs: No results found for: LITHIUM No results found for: VALPROATE No components found for:  CBMZ  Current Medications: Current Outpatient Medications  Medication Sig Dispense Refill  . albuterol (VENTOLIN HFA) 108 (90 Base) MCG/ACT inhaler Inhale 2 puffs into the lungs every 6 (six) hours as needed for wheezing or shortness of breath. 18 g 11  . budesonide-formoterol (SYMBICORT) 160-4.5 MCG/ACT inhaler Inhale 2 puffs into the lungs 2 (two) times daily. 1 Inhaler 11  . FLUARIX QUADRIVALENT 0.5 ML injection     . gabapentin (NEURONTIN) 400 MG  capsule Take 1 capsule (400 mg total) by mouth 3 (three) times daily. 270 capsule 0  . hydrOXYzine (ATARAX/VISTARIL) 50 MG tablet Take 1 tablet (50 mg total) by mouth daily. At bed time 90 tablet 0  . levonorgestrel (LILETTA, 52 MG,) 19.5 MCG/DAY IUD IUD 1 each by Intrauterine route once.    . loratadine (CLARITIN) 10 MG tablet Claritin 10 mg tablet  Take 1 tablet every day by oral route as needed.    Marland Kitchen QUEtiapine (SEROQUEL) 50 MG tablet Take 1.5-2 tablets (75-100 mg total) by mouth at bedtime. 180 tablet 0  . rizatriptan (MAXALT) 10 MG tablet Take 1 tablet (10 mg total) by mouth as needed for migraine. May repeat  in 2 hours if needed 10 tablet 1  . Teriparatide, Recombinant, (FORTEO) 620 MCG/2.48ML SOPN Inject into the skin daily. Injections    . Vitamin D, Ergocalciferol, (DRISDOL) 1.25 MG (50000 UNIT) CAPS capsule Take 50,000 Units by mouth once a week.     No current facility-administered medications for this visit.     Musculoskeletal: Strength & Muscle Tone: UTA Gait & Station: normal Patient leans: N/A  Psychiatric Specialty Exam: Review of Systems  Psychiatric/Behavioral: Positive for dysphoric mood.  All other systems reviewed and are negative.   There were no vitals taken for this visit.There is no height or weight on file to calculate BMI.  General Appearance: Casual  Eye Contact:  Fair  Speech:  Clear and Coherent  Volume:  Normal  Mood:  Depressed  Affect:  Congruent  Thought Process:  Goal Directed and Descriptions of Associations: Intact  Orientation:  Full (Time, Place, and Person)  Thought Content: Logical   Suicidal Thoughts:  No  Homicidal Thoughts:  No  Memory:  Immediate;   Fair Recent;   Fair Remote;   Fair  Judgement:  Fair  Insight:  Fair  Psychomotor Activity:  Normal  Concentration:  Concentration: Fair and Attention Span: Fair  Recall:  [employer] of Knowledge: Fair  Language: Fair  Akathisia:  No  Handed:  Right  AIMS (if indicated): UTA   Assets:  Communication Skills Desire for Improvement Housing Social Support  ADL's:  Intact  Cognition: WNL  Sleep:  Fair   Screenings: GAD-7     Office Visit from 10/12/2019 in [facility]  Total GAD-7 Score 16    PHQ2-9     Office Visit from 02/16/2020 in [facility] Office Visit from 10/12/2019 in [facility]  PHQ-2 Total Score 0 1  PHQ-9 Total Score 0 15       Assessment and Plan: Aayla Marrocco is a 29 year old Caucasian female, engaged, on SSI, lives in [place], has a history of migraine headaches, osteoporosis, 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  Patient is biologically predisposed given her family history of mental health problems.  Patient with history of trauma.  Patient is currently struggling with mild depressive symptoms and will benefit from the following medication changes.  Plan as noted below.  Plan Schizoaffective disorder-unstable Increase Seroquel to 75 mg p.o. nightly.  If she tolerates a 75 mg well she could go up to 100 mg in a week from now. Gabapentin 400 mg p.o. 3 times daily This was initially started for seizures by her providers.  We will continue the same for mood stabilization.   Tobacco use disorder-improving She is currently cutting back.   I have reviewed and discussed the following labs with patient-dated 02/16/2020-TSH-within normal limits, lipid panel-within normal limits, CBC with differential-within normal limits except for platelet count-low at 128.  CMP-within normal limits, prolactin low at 4. Patient will communicate with her primary care provider regarding her low platelet count.  Unknown if this is chronic. Patient is on medications like gabapentin which can cause low platelet counts.  I have also reviewed EKG, 02/16/2020 -within normal limits.  Patient is currently following up with neurology for her seizures versus  pseudoseizures and have EEG scheduled.  Follow-up in clinic in 4 weeks or sooner if needed.  I have spent atleast 20 minutes face to face by video with patient today. More than 50 % of the time was spent for preparing to see the  patient ( e.g., review of test, records ), ordering medications and test ,psychoeducation and supportive psychotherapy and care coordination,as well as documenting clinical information in electronic health record,interpreting and communication of test results. This note was generated in part or whole with voice recognition software. Voice recognition is usually quite accurate but there are transcription errors that can and very often do occur. I apologize for any typographical errors that were not detected and corrected.      Jomarie LongsSaramma Ariv Penrod, MD 03/15/2020, 8:28 AM

## 2020-03-20 DIAGNOSIS — E559 Vitamin D deficiency, unspecified: Secondary | ICD-10-CM | POA: Diagnosis not present

## 2020-03-20 DIAGNOSIS — M818 Other osteoporosis without current pathological fracture: Secondary | ICD-10-CM | POA: Diagnosis not present

## 2020-04-12 ENCOUNTER — Telehealth: Payer: PPO | Admitting: Psychiatry

## 2020-04-14 DIAGNOSIS — R569 Unspecified convulsions: Secondary | ICD-10-CM | POA: Diagnosis not present

## 2020-04-18 DIAGNOSIS — R569 Unspecified convulsions: Secondary | ICD-10-CM | POA: Diagnosis not present

## 2020-04-20 ENCOUNTER — Emergency Department
Admission: EM | Admit: 2020-04-20 | Discharge: 2020-04-20 | Disposition: A | Discharge status: Home / Self Care | Payer: PPO | Attending: Emergency Medicine | Admitting: Emergency Medicine

## 2020-04-20 DIAGNOSIS — R569 Unspecified convulsions: Secondary | ICD-10-CM | POA: Insufficient documentation

## 2020-04-20 DIAGNOSIS — R55 Syncope and collapse: Secondary | ICD-10-CM | POA: Diagnosis not present

## 2020-04-20 DIAGNOSIS — R2689 Other abnormalities of gait and mobility: Secondary | ICD-10-CM | POA: Diagnosis not present

## 2020-04-20 DIAGNOSIS — F445 Conversion disorder with seizures or convulsions: Secondary | ICD-10-CM

## 2020-04-20 DIAGNOSIS — R000 Tachycardia, unspecified: Secondary | ICD-10-CM | POA: Diagnosis not present

## 2020-04-20 DIAGNOSIS — R404 Transient alteration of awareness: Secondary | ICD-10-CM | POA: Diagnosis not present

## 2020-04-20 DIAGNOSIS — R0689 Other abnormalities of breathing: Secondary | ICD-10-CM | POA: Diagnosis not present

## 2020-04-20 DIAGNOSIS — G4089 Other seizures: Secondary | ICD-10-CM | POA: Diagnosis not present

## 2020-04-20 LAB — BASIC METABOLIC PANEL
Anion gap: 12 (ref 5–15)
BUN: 16 mg/dL (ref 6–20)
CO2: 22 mmol/L (ref 22–32)
Calcium: 9.4 mg/dL (ref 8.9–10.3)
Chloride: 106 mmol/L (ref 98–111)
Creatinine, Ser: 0.81 mg/dL (ref 0.44–1.00)
GFR, Estimated: >60 mL/min (ref >60)
Glucose, Bld: 101 mg/dL — ABNORMAL HIGH (ref 70–99)
Potassium: 3.5 mmol/L (ref 3.5–5.1)
Sodium: 140 mmol/L (ref 135–145)

## 2020-04-20 LAB — CBG MONITORING, ED: Glucose-Capillary: 99 mg/dL (ref 70–99)

## 2020-04-20 LAB — CBC
HCT: 37 % (ref 36.0–46.0)
Hemoglobin: 13.5 g/dL (ref 12.0–15.0)
MCH: 32.6 pg (ref 26.0–34.0)
MCHC: 36.5 g/dL — ABNORMAL HIGH (ref 30.0–36.0)
MCV: 89.4 fL (ref 80.0–100.0)
Platelets: 176 10*3/uL (ref 150–400)
RBC: 4.14 MIL/uL (ref 3.87–5.11)
RDW: 12.2 % (ref 11.5–15.5)
WBC: 10.8 10*3/uL — ABNORMAL HIGH (ref 4.0–10.5)
nRBC: 0 % (ref 0.0–0.2)

## 2020-04-20 NOTE — ED Triage Notes (Signed)
  Patient BIB EMS for seizure like activity that started about 30 minutes ago.  Patient was diagnosed with pseudoseizures 2 days ago.  No meds given by EMS per Dr Ermalinda Memos.  Patient twisting around in stretcher and will occasionally open eyes. Vitals stable on arrival.

## 2020-04-20 NOTE — ED Provider Notes (Addendum)
[facility] Emergency Department Provider Note   ____________________________________________   Event Date/Time   First MD Initiated Contact with Patient 04/20/20 2114     (approximate)  I have reviewed the triage vital signs and the nursing notes.   HISTORY  Chief Complaint Seizures    HPI Stacy Taylor is a 29 y.o. 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  Patient arrives with eyes closed and intermittently opening them but will not respond to any questions.         No past medical history on file.  There are no problems to display for this patient.     Prior to Admission medications   Not on File    Allergies Patient has no allergy information on record.  No family history on file.  Social History    Review of Systems Unable to assess ____________________________________________   PHYSICAL EXAM:  VITAL SIGNS: ED Triage Vitals  Enc Vitals Group     BP 04/20/20 2118 (!) 125/91     Pulse Rate 04/20/20 2118 (!) 126     Resp 04/20/20 2118 (!) 22     Temp 04/20/20 2118 100.2 F (37.9 C)     Temp Source 04/20/20 2118 Axillary     SpO2 04/20/20 2118 98 %     Weight --      Height --      Head Circumference --      Peak Flow --      Pain Score 04/20/20 2119 Asleep     Pain Loc --      Pain Edu? --      Excl. in GC? --    Constitutional: Eyes closed with intermittent and nonpurposeful head movements but not following commands. Well appearing and in no acute distress. Eyes: Conjunctivae are normal. PERRL. Head: Atraumatic. Nose: No congestion/rhinnorhea. Mouth/Throat: Mucous membranes are moist. Neck: No stridor Cardiovascular: Grossly normal heart sounds.  Good peripheral circulation. Respiratory: Normal respiratory effort.  No retractions. Gastrointestinal: Soft . No distention. Musculoskeletal:  No obvious deformities Neurologic: GCS 9.  Moving all extremities spontaneously Skin:  Skin is warm and dry. No rash noted.  ____________________________________________   LABS (all labs ordered are listed, but only abnormal results are displayed)  Labs Reviewed  BASIC METABOLIC PANEL - Abnormal; Notable for the following components:      Result Value   Glucose, Bld 101 (*)    All other components within normal limits  CBC - Abnormal; Notable for the following components:   WBC 10.8 (*)    MCHC 36.5 (*)    All other components within normal limits  CBG MONITORING, ED  POC URINE PREG, ED    PROCEDURES  Procedure(s) performed (including Critical Care):  .1-3 Lead EKG Interpretation Performed by: Merwyn Katos, MD Authorized by: Merwyn Katos, MD     Interpretation: normal     ECG rate:  93   ECG rate assessment: normal     Rhythm: sinus rhythm     Ectopy: none     Conduction: normal       ____________________________________________   INITIAL IMPRESSION / ASSESSMENT AND PLAN / ED COURSE  As part of my medical decision making, I reviewed the following data within the electronic MEDICAL RECORD NUMBER Nursing notes reviewed and incorporated, Labs reviewed, Old chart reviewed, and Notes from prior ED visits reviewed and incorporated  Patient presents after recent seizure like episode.  Patient did not have slow return to baseline mental and physical function  No immunosuppresion hx and had no preceding fever. No history of alcohol abuse or suspicion for toxin ingestion. Unlikely stroke, syncope. Unlikely infectious etiology. No preceding trauma.  Workup: EKG, BMP, POCT glucose (pregnancy test if female).  Field Interventions: None ED Interventions: None  Patient seizure activity resolved upon arrival to the emergency department and patient returned to baseline once her significant other arrived.  Patient was counseled on the importance of follow-up  with psychiatry and neuro Disposition: Discharge home with primary care follow up in next 24-48 hours.      ____________________________________________   FINAL CLINICAL IMPRESSION(S) / ED DIAGNOSES  Final diagnoses:  Pseudoseizure  Syncope, unspecified syncope type  Balance problem     ED Discharge Orders    None       Note:  This document was prepared using Dragon voice recognition software and may include unintentional dictation errors.   Merwyn Katos, MD 04/20/20 Janetta Hora    Merwyn Katos, MD 04/20/20 [phone]

## 2020-04-22 ENCOUNTER — Encounter: Payer: Self-pay | Admitting: Psychiatry

## 2020-05-03 ENCOUNTER — Telehealth: Payer: Self-pay

## 2020-05-03 NOTE — Telephone Encounter (Signed)
Medication management - Spoke to Lurena Joiner, pharmacist at [facility] to verify they still had pt's Seroquel order from 03/14/20 and would fill for pt. Called pt back to inform order was being filled and that pt could then take needed total of 100mg  at bedtime, 2 of the 50 mg tablets at bedtime total and patient will call our office back if any problems with increase from 75 mg total to 100 mg total each day at bedtime.

## 2020-05-03 NOTE — Telephone Encounter (Signed)
Medication management - Telephone call with pt to follow up on her request for a new order of Seroquel for 100 mg at bedtime. Pt. stated this was at the recommendation of her neurologist as she had a hour and a half seizure by report on Christmas Eve 2021.  Pt. Stated she has been taking a total of 75 mg at present and problems getting her pharmacy to fill last order that was sent in for 75mg  - 100 mg at bedtime.  Pt. Reported her neurologist called her today and felt she should go up to the 100 mg a day as soon as possible and for her to request this dosage from Dr.Eappen.  Agreed to send Dr. the request.

## 2020-05-03 NOTE — Telephone Encounter (Signed)
QUEtiapine (SEROQUEL) 50 MG tablet [[id number]]    Order Details Dose: 75-100 mg Route: Oral Frequency: Daily at bedtime  Dispense Quantity: 180 tablet Refills: 0     She does not need a new script to increase to 100 mg. She already has enough supplies to do so. Please let her know.

## 2020-05-15 ENCOUNTER — Other Ambulatory Visit: Payer: Self-pay

## 2020-05-15 ENCOUNTER — Encounter: Payer: Self-pay | Admitting: Psychiatry

## 2020-05-15 ENCOUNTER — Telehealth (INDEPENDENT_AMBULATORY_CARE_PROVIDER_SITE_OTHER): Payer: PPO | Admitting: Psychiatry

## 2020-05-15 DIAGNOSIS — F17200 Nicotine dependence, unspecified, uncomplicated: Secondary | ICD-10-CM | POA: Diagnosis not present

## 2020-05-15 DIAGNOSIS — F250 Schizoaffective disorder, bipolar type: Secondary | ICD-10-CM

## 2020-05-15 MED ORDER — HYDROXYZINE HCL 50 MG PO TABS: 50.0000 mg | ORAL_TABLET | Freq: Every day | ORAL | 0 refills | Status: DC

## 2020-05-15 MED ORDER — GABAPENTIN 400 MG PO CAPS: 400.0000 mg | ORAL_CAPSULE | Freq: Three times a day (TID) | ORAL | 0 refills | Status: AC

## 2020-05-15 NOTE — Progress Notes (Signed)
Virtual Visit via Video Note  I connected with Stacy Taylor on 05/15/20 at  3:00 PM EST by a video enabled telemedicine application and verified that I am speaking with the correct person using two identifiers.  Location Provider Location : [facility] Patient Location : Home  Participants: Patient , Provider   I discussed the limitations of evaluation and management by telemedicine and the availability of in person appointments. The patient expressed understanding and agreed to proceed.    I discussed the assessment and treatment plan with the patient. The patient was provided an opportunity to ask questions and all were answered. The patient agreed with the plan and demonstrated an understanding of the instructions.   The patient was advised to call back or seek an in-person evaluation if the symptoms worsen or if the condition fails to improve as anticipated.  BH MD OP Progress Note  05/15/2020 3:57 PM Stacy Taylor  MRN:  [id number]  Chief Complaint:  Chief Complaint    Follow-up     HPI: Stacy Taylor is a 30 year old Caucasian female, currently lives in [place], has a history of schizoaffective disorder, migraine headaches, seizure disorder was evaluated by telemedicine today.  Patient today reports she went to the emergency department recently with seizures.  She reports she also had neurology consultation and had a 1 hr EEG as well as a 4-hour EEG completed.  The 1 hour EEG did not show any seizure-like activity however she continues to wait for the results of the 4-hour EEG report.  She reports since her most recent seizures she has been struggling with brain fog and feels off balance.  She reports she had to quit her job since she has difficulty functioning.  Patient currently denies any significant mood symptoms.  She does report she is anxious about her health since she is waiting for answers about her seizures.  She reports she has upcoming appointment with  neurologist next week and is hopeful that she will find some answers for her questions.  She reports sleep is okay.  She is compliant on all her medications and denies side effects.  She reports her Seroquel at this dosage does help.  Patient denies any suicidality, homicidality or perceptual disturbances.  She continues to cut back on her smoking cigarettes.  Patient reports she had COVID-19 infection however currently has recovered from it.  She does have mild runny nose which is getting better.  Patient denies any other concerns today.  Visit Diagnosis:    ICD-10-CM   1. Schizoaffective disorder, bipolar type (HCC)  F25.0 gabapentin (NEURONTIN) 400 MG capsule    hydrOXYzine (ATARAX/VISTARIL) 50 MG tablet  2. Tobacco use disorder  F17.200     Past Psychiatric History: I have reviewed past psychiatric history from my progress note on 12/18/2019.  Past trials of Zyprexa, gabapentin, multiple other medications-does not remember names  Past Medical History:  Past Medical History:  Diagnosis Date   Anxiety    Asthma    Depression    Fetal alcohol syndrome    Migraines    Osteoporosis    Scoliosis    Seizures (HCC)     Past Surgical History:  Procedure Laterality Date   FRACTURE SURGERY     HAND SURGERY     TONSILECTOMY, ADENOIDECTOMY, BILATERAL MYRINGOTOMY AND TUBES     WRIST SURGERY      Family Psychiatric History: I have reviewed family psychiatric history from my progress note on 12/18/2019  Family History:  Family History  Problem  Relation Age of Onset   Drug abuse Other    Mental illness Other     Social History: Reviewed social history from my progress note on 12/18/2019 Social History   Socioeconomic History   Marital status: Married    Spouse name: Not on file   Number of children: Not on file   Years of education: Not on file   Highest education level: Not on file  Occupational History   Not on file  Tobacco Use   Smoking status:  Current Every Day Smoker    Packs/day: 0.25    Types: Cigarettes   Smokeless tobacco: Never Used   Tobacco comment: 1 cig per day reported 03/14/2020  Vaping Use   Vaping Use: Never used  Substance and Sexual Activity   Alcohol use: Yes    Comment: socially   Drug use: Never   Sexual activity: Yes  Other Topics Concern   Not on file  Social History Narrative   ** Merged History Encounter **       Social Determinants of Health   Financial Resource Strain: Not on file  Food Insecurity: Not on file  Transportation Needs: Not on file  Physical Activity: Not on file  Stress: Not on file  Social Connections: Not on file    Allergies:  Allergies  Allergen Reactions   Ibuprofen     Abdominal pain, upset stomach    Metabolic Disorder Labs: Lab Results  Component Value Date   HGBA1C 3.9 02/16/2020   Lab Results  Component Value Date   PROLACTIN 4.0 (L) 02/16/2020   Lab Results  Component Value Date   CHOL 143 02/16/2020   TRIG 55 02/16/2020   HDL 48 02/16/2020   LDLCALC 83 02/16/2020   Lab Results  Component Value Date   TSH 0.785 02/16/2020    Therapeutic Level Labs: No results found for: LITHIUM No results found for: VALPROATE No components found for:  CBMZ  Current Medications: Current Outpatient Medications  Medication Sig Dispense Refill   albuterol (VENTOLIN HFA) 108 (90 Base) MCG/ACT inhaler Inhale 2 puffs into the lungs every 6 (six) hours as needed for wheezing or shortness of breath. 18 g 11   budesonide-formoterol (SYMBICORT) 160-4.5 MCG/ACT inhaler Inhale 2 puffs into the lungs 2 (two) times daily. 1 Inhaler 11   FLUARIX QUADRIVALENT 0.5 ML injection      gabapentin (NEURONTIN) 400 MG capsule Take 1 capsule (400 mg total) by mouth 3 (three) times daily. 270 capsule 0   hydrOXYzine (ATARAX/VISTARIL) 50 MG tablet Take 1 tablet (50 mg total) by mouth daily. At bed time 90 tablet 0   levonorgestrel (LILETTA, 52 MG,) 19.5 MCG/DAY IUD IUD  1 each by Intrauterine route once.     loratadine (CLARITIN) 10 MG tablet Claritin 10 mg tablet  Take 1 tablet every day by oral route as needed.     QUEtiapine (SEROQUEL) 50 MG tablet Take 1.5-2 tablets (75-100 mg total) by mouth at bedtime. 180 tablet 0   rizatriptan (MAXALT) 10 MG tablet Take 1 tablet (10 mg total) by mouth as needed for migraine. May repeat in 2 hours if needed 10 tablet 1   Teriparatide, Recombinant, (FORTEO) 620 MCG/2.48ML SOPN Inject into the skin daily. Injections     Vitamin D, Ergocalciferol, (DRISDOL) 1.25 MG (50000 UNIT) CAPS capsule Take 50,000 Units by mouth once a week.     No current facility-administered medications for this visit.     Musculoskeletal: Strength & Muscle Tone: UTA Gait &  Station: UTA Patient leans: N/A  Psychiatric Specialty Exam: Review of Systems  Constitutional: Positive for fatigue.  HENT: Positive for rhinorrhea.   Psychiatric/Behavioral: The patient is nervous/anxious.   All other systems reviewed and are negative.   There were no vitals taken for this visit.There is no height or weight on file to calculate BMI.  General Appearance: Casual  Eye Contact:  Fair  Speech:  Clear and Coherent  Volume:  Normal  Mood:  Anxious  Affect:  Congruent  Thought Process:  Goal Directed and Descriptions of Associations: Intact  Orientation:  Full (Time, Place, and Person)  Thought Content: Logical   Suicidal Thoughts:  No  Homicidal Thoughts:  No  Memory:  Immediate;   Fair Recent;   Fair Remote;   Fair  Judgement:  Fair  Insight:  Fair  Psychomotor Activity:  Normal  Concentration:  Concentration: Fair and Attention Span: Fair  Recall:  [employer] of Knowledge: Fair  Language: Fair  Akathisia:  No  Handed:  Right  AIMS (if indicated): UTA  Assets:  Communication Skills Desire for Improvement Housing Intimacy  ADL's:  Intact  Cognition: WNL  Sleep:  Fair   Screenings: GAD-7   Flowsheet Row Office Visit from  10/12/2019 in [facility]  Total GAD-7 Score 16    PHQ2-9   Flowsheet Row Office Visit from 02/16/2020 in [facility] Office Visit from 10/12/2019 in [facility]  PHQ-2 Total Score 0 1  PHQ-9 Total Score 0 15       Assessment and Plan: Stacy Taylor is a 30 year old Caucasian female, engaged, on SSI, lives in [place], has a history of migraine headaches, osteoporosis, seizure disorder 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  Patient is biologically predisposed given her family history of mental health problems.  Patient with history of trauma.  Patient is currently struggling with seizure-like spells and does report having cognitive problems from the same.  She will continue to follow-up with neurology.  Plan as noted below.  Plan Schizoaffective disorder- improving Seroquel 100 mg p.o. nightly Gabapentin 400 mg p.o. 3 times daily-currently being managed by neurologist for her seizure disorder and migraine headaches.  Tobacco use disorder-improving Provided counseling.  I have reviewed most recent notes per neurology- dated 05/02/2020-Dr. Malvin Johns.  We will continue to coordinate care.  Follow-up in clinic in 3 to 4 weeks or sooner if needed.  I have spent atleast 20 minutes face to face by video with patient today. More than 50 % of the time was spent for preparing to see the patient ( e.g., review of test, records ),ordering medications and test ,psychoeducation and supportive psychotherapy and care coordination,as well as documenting clinical information in electronic health record. This note was generated in part or whole with voice recognition software. Voice recognition is usually quite accurate but there are transcription errors that can and very often do occur. I apologize for any typographical errors that were not detected and corrected.      Jomarie Longs, MD 05/16/2020, 9:35 AM

## 2020-06-05 ENCOUNTER — Telehealth (INDEPENDENT_AMBULATORY_CARE_PROVIDER_SITE_OTHER): Payer: PPO | Admitting: Psychiatry

## 2020-06-05 ENCOUNTER — Other Ambulatory Visit: Payer: Self-pay

## 2020-06-05 ENCOUNTER — Encounter: Payer: Self-pay | Admitting: Psychiatry

## 2020-06-05 VITALS — Ht <= 58 in | Wt 85.0 lb

## 2020-06-05 DIAGNOSIS — F250 Schizoaffective disorder, bipolar type: Secondary | ICD-10-CM

## 2020-06-05 DIAGNOSIS — F17200 Nicotine dependence, unspecified, uncomplicated: Secondary | ICD-10-CM | POA: Diagnosis not present

## 2020-06-05 NOTE — Progress Notes (Signed)
Virtual Visit via Video Note  I connected with Stacy Taylor on 06/05/20 at  9:00 AM EST by a video enabled telemedicine application and verified that I am speaking with the correct person using two identifiers.  Location Provider Location : [facility] Patient Location : Home  Participants: Patient , Provider    I discussed the limitations of evaluation and management by telemedicine and the availability of in person appointments. The patient expressed understanding and agreed to proceed.   I discussed the assessment and treatment plan with the patient. The patient was provided an opportunity to ask questions and all were answered. The patient agreed with the plan and demonstrated an understanding of the instructions.   The patient was advised to call back or seek an in-person evaluation if the symptoms worsen or if the condition fails to improve as anticipated.   BH MD OP Progress Note  06/05/2020 3:31 PM Matea Stanard  MRN:  [id number]  Chief Complaint:  Chief Complaint    Follow-up     HPI: Stacy Taylor is a 30 year old Caucasian female, currently lives in [place], has a history of schizoaffective disorder, migraine headaches, seizure disorder was evaluated by telemedicine today.  Patient reports she is currently making progress with regards to her mood symptoms.  She does not feel as anxious as she used to before.  She does report recent changes with her medications.  She reports her neurologist recently started her on BuSpar 15 mg twice a day.  She has been taking it only once a day and plans to increase the dosage gradually.  She takes it at bedtime.  She does report that ever since starting that medication she has been having migraine headaches as well as vivid dreams at night.  Patient is on Maxalt for her migraine headaches which seems to help.  Patient reports in spite of having vivid dreams her sleep has been going well.  Patient reports her Seroquel was recently  reduced to 75 mg by her neurologist since she was waking up feeling groggy.  Since starting the lower dosage she feels better.  Patient denies having any pseudoseizures since her last visit with Clinical research associate.  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  She was advised to start journaling to help resolve internal conflict and anxiety.  She has been doing that ever since.  That seems to help.  Patient reports her husband has been concerned about her weight.  She reports she started taking Ensure shakes which was sent to her by her dad.  She reports she also has been eating okay otherwise.  She currently weighs around 95 pounds and does not feel tired, reports good energy level.  She denies any binging on food, restrictive behaviors, compulsive behaviors.  Patient currently denies any eating disorder symptoms.  Patient denies any suicidality, homicidality or perceptual disturbances.  She currently works at a [employer] and reports she enjoys working there since she enjoys working with animals.  She works 20 hours/week.  Patient denies any other concerns today.  Visit Diagnosis: R/O functional neurological symptom disorder   ICD-10-CM   1. Schizoaffective disorder, bipolar type (HCC)  F25.0   2. Tobacco use disorder  F17.200     Past Psychiatric History: I have reviewed past psychiatric history from my progress note on 12/18/2019.  Past trials of Zyprexa, gabapentin, multiple other medications-does not remember names.  Past Medical History:  Past Medical History:  Diagnosis Date  .  Anxiety   . Asthma   . Depression   . Fetal alcohol syndrome   . Migraines   . Osteoporosis   . Scoliosis   . Seizures (HCC)     Past Surgical History:  Procedure Laterality Date  . FRACTURE SURGERY    . HAND SURGERY    . TONSILECTOMY, ADENOIDECTOMY, BILATERAL MYRINGOTOMY AND TUBES    . WRIST SURGERY      Family Psychiatric History: I have reviewed  family psychiatric history from my progress note on 12/18/2019  Family History:  Family History  Problem Relation Age of Onset  . Drug abuse Other   . Mental illness Other     Social History: Reviewed social history from my progress note on 12/18/2019 Social History   Socioeconomic History  . Marital status: Married    Spouse name: Not on file  . Number of children: Not on file  . Years of education: Not on file  . Highest education level: Not on file  Occupational History  . Not on file  Tobacco Use  . Smoking status: Current Every Day Smoker    Packs/day: 0.25    Types: Cigarettes  . Smokeless tobacco: Never Used  . Tobacco comment: 1 cig per day reported 03/14/2020  Vaping Use  . Vaping Use: Never used  Substance and Sexual Activity  . Alcohol use: Yes    Comment: socially  . Drug use: Never  . Sexual activity: Yes  Other Topics Concern  . Not on file  Social History Narrative   ** Merged History Encounter **       Social Determinants of Health   Financial Resource Strain: Not on file  Food Insecurity: Not on file  Transportation Needs: Not on file  Physical Activity: Not on file  Stress: Not on file  Social Connections: Not on file    Allergies:  Allergies  Allergen Reactions  . Ibuprofen     Abdominal pain, upset stomach    Metabolic Disorder Labs: Lab Results  Component Value Date   HGBA1C 3.9 02/16/2020   Lab Results  Component Value Date   PROLACTIN 4.0 (L) 02/16/2020   Lab Results  Component Value Date   CHOL 143 02/16/2020   TRIG 55 02/16/2020   HDL 48 02/16/2020   LDLCALC 83 02/16/2020   Lab Results  Component Value Date   TSH 0.785 02/16/2020    Therapeutic Level Labs: No results found for: LITHIUM No results found for: VALPROATE No components found for:  CBMZ  Current Medications: Current Outpatient Medications  Medication Sig Dispense Refill  . busPIRone (BUSPAR) 15 MG tablet Take by mouth.    . QUEtiapine (SEROQUEL) 50  MG tablet Take by mouth.    Marland Kitchen albuterol (VENTOLIN HFA) 108 (90 Base) MCG/ACT inhaler Inhale 2 puffs into the lungs every 6 (six) hours as needed for wheezing or shortness of breath. 18 g 11  . budesonide-formoterol (SYMBICORT) 160-4.5 MCG/ACT inhaler Inhale 2 puffs into the lungs 2 (two) times daily. 1 Inhaler 11  . FLUARIX QUADRIVALENT 0.5 ML injection     . gabapentin (NEURONTIN) 400 MG capsule Take 1 capsule (400 mg total) by mouth 3 (three) times daily. 270 capsule 0  . hydrOXYzine (ATARAX/VISTARIL) 50 MG tablet Take 1 tablet (50 mg total) by mouth daily. At bed time 90 tablet 0  . levonorgestrel (LILETTA, 52 MG,) 19.5 MCG/DAY IUD IUD 1 each by Intrauterine route once.    . loratadine (CLARITIN) 10 MG tablet Claritin 10 mg  tablet  Take 1 tablet every day by oral route as needed.    . rizatriptan (MAXALT) 10 MG tablet Take 1 tablet (10 mg total) by mouth as needed for migraine. May repeat in 2 hours if needed 10 tablet 1  . Teriparatide, Recombinant, (FORTEO) 620 MCG/2.48ML SOPN Inject into the skin daily. Injections    . Vitamin D, Ergocalciferol, (DRISDOL) 1.25 MG (50000 UNIT) CAPS capsule Take 50,000 Units by mouth once a week.     No current facility-administered medications for this visit.     Musculoskeletal: Strength & Muscle Tone: UTA Gait & Station: UTA Patient leans: N/A  Psychiatric Specialty Exam: Review of Systems  Neurological: Positive for headaches.  Psychiatric/Behavioral: The patient is nervous/anxious.   All other systems reviewed and are negative.   Height 4\' 7"  (1.397 m), weight 85 lb (38.6 kg).Body mass index is 19.76 kg/m.  General Appearance: Casual  Eye Contact:  Fair  Speech:  Clear and Coherent  Volume:  Normal  Mood:  Anxious  Affect:  Congruent  Thought Process:  Goal Directed and Descriptions of Associations: Intact  Orientation:  Full (Time, Place, and Person)  Thought Content: Logical   Suicidal Thoughts:  No  Homicidal Thoughts:  No   Memory:  Immediate;   Fair Recent;   Fair Remote;   Fair  Judgement:  Fair  Insight:  Fair  Psychomotor Activity:  Normal  Concentration:  Concentration: Fair and Attention Span: Fair  Recall:  [employer] of Knowledge: Fair  Language: Fair  Akathisia:  No  Handed:  Right  AIMS (if indicated): UTA  Assets:  Communication Skills Desire for Improvement Housing Intimacy Social Support  ADL's:  Intact  Cognition: WNL  Sleep:  Fair,vivid dreams   Screenings: GAD-7   Flowsheet Row Office Visit from 10/12/2019 in [facility]  Total GAD-7 Score 16    PHQ2-9   Flowsheet Row Office Visit from 02/16/2020 in [facility] Office Visit from 10/12/2019 in [facility]  PHQ-2 Total Score 0 1  PHQ-9 Total Score 0 15    Flowsheet Row Video Visit from 06/05/2020 in [facility]  C-SSRS RISK CATEGORY No Risk       Assessment and Plan: Dana Fonti is a 30 year old Caucasian female, married, on SSI, lives in [place], has a history of schizoaffective disorder, migraine headaches pseudoseizures, osteoporosis was evaluated by telemedicine today.  Patient is biologically predisposed given her family history of mental health problems.  Patient with history of trauma.  Patient is currently making progress with regards to her seizure-like spells as well as anxiety, will benefit from the following plan.  Plan Schizoaffective disorder-stable Continue Seroquel at reduced dosage of 75 mg p.o. nightly.  Dose recently reduced by neurology. Gabapentin 400 mg p.o. 3 times daily. Continue BuSpar 15 mg p.o. twice daily.  Discussed with patient to take the BuSpar earlier during the day and not at bedtime since she has vivid dreams.  Also discussed serotonin syndrome when combining with medications like Maxalt  R/O Functional neurological symptom disorder-Unstable Continue follow-up with neurologist.Pending EEG report. Discussed referral  for CBT-she will let writer know. Continue journaling. Continue BuSpar and Seroquel as prescribed.  Tobacco use disorder-improving Will monitor closely  I have reviewed notes per neurology-Dr. Malvin Johns.  Follow-up in clinic in 3 months or sooner if needed.  I have spent atleast 20 minutes face to face by video with patient today. More than 50 % of the time was spent for  preparing to see the patient ( e.g., review of test, records ), obtaining and to review and separately obtained history , ordering medications and test ,psychoeducation and supportive psychotherapy and care coordination,as well as documenting clinical information in electronic health record. This note was generated in part or whole with voice recognition software. Voice recognition is usually quite accurate but there are transcription errors that can and very often do occur. I apologize for any typographical errors that were not detected and corrected.        Jomarie Longs, MD 06/06/2020, 8:16 AM

## 2020-06-05 NOTE — Patient Instructions (Signed)
Serotonin Syndrome Serotonin is a chemical in your body (neurotransmitter) that helps to control several functions, such as:  Brain and nerve cell function.  Mood and emotions.  Memory.  Eating.  Sleeping.  Sexual activity.  Stress response. Having too much serotonin in your body can cause serotonin syndrome. This condition can be harmful to your brain and nerve cells. This can be a life-threatening condition. What are the causes? This condition may be caused by taking medicines or drugs that increase the level of serotonin in your body, such as:  Antidepressant medicines.  Migraine medicines.  Certain pain medicines.  Certain drugs, including ecstasy, LSD, cocaine, and amphetamines.  Over-the-counter cough or cold medicines that contain dextromethorphan.  Certain herbal supplements, including St. John's wort, ginseng, and nutmeg. This condition usually occurs when you take these medicines or drugs in combination, but it can also happen with a high dose of a single medicine or drug. What increases the risk? You are more likely to develop this condition if:  You just started taking a medicine or drug that increases the level of serotonin in the body.  You recently increased the dose of a medicine or drug that increases the level of serotonin in the body.  You take more than one medicine or drug that increases the level of serotonin in the body. What are the signs or symptoms? Symptoms of this condition usually start within several hours of taking a medicine or drug. Symptoms may be mild or severe. Mild symptoms include:  Sweating.  Restlessness or agitation.  Muscle twitching or stiffness.  Rapid heart rate.  Nausea and vomiting.  Diarrhea.  Headache.  Shivering or goose bumps.  Confusion. Severe symptoms include:  Irregular heartbeat.  Seizures.  Loss of consciousness.  High fever. How is this diagnosed? This condition may be diagnosed based  on:  Your medical history.  A physical exam.  Your prior use of drugs and medicines.  Blood or urine tests. These may be used to rule out other causes of your symptoms. How is this treated? The treatment for this condition depends on the severity of your symptoms.  For mild cases, stopping the medicine or drug that caused your condition is usually all that is needed.  For moderate to severe cases, treatment in a hospital may be needed to prevent or manage life-threatening symptoms. This may include medicines to control your symptoms, IV fluids, interventions to support your breathing, and treatments to control your body temperature. Follow these instructions at home: Medicines  Take over-the-counter and prescription medicines only as told by your health care provider. This is important.  Check with your health care provider before you start taking any new prescriptions, over-the-counter medicines, herbs, or supplements.  Avoid combining any medicines that can cause this condition to occur.   Lifestyle  Maintain a healthy lifestyle. ? Eat a healthy diet that includes plenty of vegetables, fruits, whole grains, low-fat dairy products, and lean protein. Do not eat a lot of foods that are high in fat, added sugars, or salt. ? Get the right amount and quality of sleep. Most adults need 7-9 hours of sleep each night. ? Make time to exercise, even if it is only for short periods of time. Most adults should exercise for at least 150 minutes each week. ? Do not drink alcohol. ? Do not use illegal drugs, and do not take medicines for reasons other than they are prescribed.   General instructions  Do not use any products that   contain nicotine or tobacco, such as cigarettes and e-cigarettes. If you need help quitting, ask your health care provider.  Keep all follow-up visits as told by your health care provider. This is important. Contact a health care provider if:  Your symptoms do not  improve or they get worse. Get help right away if you:  Have worsening confusion, severe headache, chest pain, high fever, seizures, or loss of consciousness.  Experience serious side effects of medicine, such as swelling of your face, lips, tongue, or throat.  Have serious thoughts about hurting yourself or others. These symptoms may represent a serious problem that is an emergency. Do not wait to see if the symptoms will go away. Get medical help right away. Call your local emergency services (911 in the [place] Do not drive yourself to the hospital. If you ever feel like you may hurt yourself or others, or have thoughts about taking your own life, get help right away. You can go to your nearest emergency department or call:  Your local emergency services (911 in the [place]  A suicide crisis helpline, such as the National Suicide Prevention Lifeline at 1-800-273-8255. This is open 24 hours a day. Summary  Serotonin is a brain chemical that helps to regulate the nervous system. High levels of serotonin in the body can cause serotonin syndrome, which is a very dangerous condition.  This condition may be caused by taking medicines or drugs that increase the level of serotonin in your body.  Treatment depends on the severity of your symptoms. For mild cases, stopping the medicine or drug that caused your condition is usually all that is needed.  Check with your health care provider before you start taking any new prescriptions, over-the-counter medicines, herbs, or supplements. This information is not intended to replace advice given to you by your health care provider. Make sure you discuss any questions you have with your health care provider. Document Revised: 05/21/2017 Document Reviewed: 05/21/2017 Elsevier Patient Education  2021 [employer]  

## 2020-07-19 ENCOUNTER — Other Ambulatory Visit: Payer: Self-pay | Admitting: Psychiatry

## 2020-07-19 DIAGNOSIS — F250 Schizoaffective disorder, bipolar type: Secondary | ICD-10-CM

## 2020-08-02 ENCOUNTER — Telehealth: Payer: Self-pay | Admitting: Psychiatry

## 2020-08-02 DIAGNOSIS — F250 Schizoaffective disorder, bipolar type: Secondary | ICD-10-CM

## 2020-08-02 MED ORDER — HYDROXYZINE HCL 50 MG PO TABS: ORAL_TABLET | ORAL | 0 refills | Status: DC

## 2020-08-02 NOTE — Telephone Encounter (Signed)
I have sent hydroxyzine to pharmacy again since we received a request by fax although this was sent out on 07/23/2020.

## 2020-08-12 ENCOUNTER — Other Ambulatory Visit: Payer: Self-pay

## 2020-08-12 ENCOUNTER — Telehealth (INDEPENDENT_AMBULATORY_CARE_PROVIDER_SITE_OTHER): Payer: PPO | Admitting: Psychiatry

## 2020-08-12 DIAGNOSIS — Z5329 Procedure and treatment not carried out because of patient's decision for other reasons: Secondary | ICD-10-CM

## 2020-08-12 NOTE — Progress Notes (Signed)
No response to call or text or video invite  

## 2020-08-17 DIAGNOSIS — W540XXA Bitten by dog, initial encounter: Secondary | ICD-10-CM | POA: Diagnosis not present

## 2020-08-17 DIAGNOSIS — Z23 Encounter for immunization: Secondary | ICD-10-CM | POA: Diagnosis not present

## 2020-08-17 DIAGNOSIS — S51852A Open bite of left forearm, initial encounter: Secondary | ICD-10-CM | POA: Diagnosis not present

## 2020-08-18 DIAGNOSIS — R519 Headache, unspecified: Secondary | ICD-10-CM | POA: Diagnosis not present

## 2020-08-19 ENCOUNTER — Telehealth: Payer: Self-pay

## 2020-08-19 NOTE — Telephone Encounter (Signed)
Received fax from after hours call center stating that the patient has a dog bite on her left arm. Patient called in 08/17/20. Please reach out to patient and schedule an appointment for patient if she would like or has not already been seen.

## 2020-08-20 ENCOUNTER — Encounter: Payer: Self-pay | Admitting: Family Medicine

## 2020-08-20 ENCOUNTER — Emergency Department: Payer: PPO

## 2020-08-20 ENCOUNTER — Ambulatory Visit (INDEPENDENT_AMBULATORY_CARE_PROVIDER_SITE_OTHER): Payer: PPO | Admitting: Family Medicine

## 2020-08-20 ENCOUNTER — Emergency Department
Admission: EM | Admit: 2020-08-20 | Discharge: 2020-08-20 | Discharge status: Against Medical Advice | Payer: PPO | Attending: Emergency Medicine | Admitting: Emergency Medicine

## 2020-08-20 ENCOUNTER — Other Ambulatory Visit: Payer: Self-pay

## 2020-08-20 VITALS — BP 113/73 | HR 105 | Temp 98.5°F | Wt 96.0 lb

## 2020-08-20 DIAGNOSIS — Z7951 Long term (current) use of inhaled steroids: Secondary | ICD-10-CM | POA: Insufficient documentation

## 2020-08-20 DIAGNOSIS — S59912D Unspecified injury of left forearm, subsequent encounter: Secondary | ICD-10-CM | POA: Diagnosis present

## 2020-08-20 DIAGNOSIS — J45909 Unspecified asthma, uncomplicated: Secondary | ICD-10-CM | POA: Diagnosis not present

## 2020-08-20 DIAGNOSIS — R000 Tachycardia, unspecified: Secondary | ICD-10-CM | POA: Diagnosis not present

## 2020-08-20 DIAGNOSIS — W540XXA Bitten by dog, initial encounter: Secondary | ICD-10-CM | POA: Diagnosis not present

## 2020-08-20 DIAGNOSIS — D696 Thrombocytopenia, unspecified: Secondary | ICD-10-CM | POA: Diagnosis not present

## 2020-08-20 DIAGNOSIS — R519 Headache, unspecified: Secondary | ICD-10-CM

## 2020-08-20 DIAGNOSIS — S51852A Open bite of left forearm, initial encounter: Secondary | ICD-10-CM | POA: Diagnosis not present

## 2020-08-20 DIAGNOSIS — H53149 Visual discomfort, unspecified: Secondary | ICD-10-CM | POA: Insufficient documentation

## 2020-08-20 DIAGNOSIS — R509 Fever, unspecified: Secondary | ICD-10-CM

## 2020-08-20 DIAGNOSIS — W540XXD Bitten by dog, subsequent encounter: Secondary | ICD-10-CM

## 2020-08-20 DIAGNOSIS — S51832A Puncture wound without foreign body of left forearm, initial encounter: Secondary | ICD-10-CM | POA: Insufficient documentation

## 2020-08-20 DIAGNOSIS — M542 Cervicalgia: Secondary | ICD-10-CM | POA: Insufficient documentation

## 2020-08-20 DIAGNOSIS — F17210 Nicotine dependence, cigarettes, uncomplicated: Secondary | ICD-10-CM | POA: Diagnosis not present

## 2020-08-20 DIAGNOSIS — S51852D Open bite of left forearm, subsequent encounter: Secondary | ICD-10-CM | POA: Diagnosis not present

## 2020-08-20 DIAGNOSIS — R9431 Abnormal electrocardiogram [ECG] [EKG]: Secondary | ICD-10-CM | POA: Diagnosis not present

## 2020-08-20 LAB — COMPREHENSIVE METABOLIC PANEL
ALT: 19 U/L (ref 0–44)
AST: 27 U/L (ref 15–41)
Albumin: 4.2 g/dL (ref 3.5–5.0)
Alkaline Phosphatase: 45 U/L (ref 38–126)
Anion gap: 6 (ref 5–15)
BUN: 15 mg/dL (ref 6–20)
CO2: 25 mmol/L (ref 22–32)
Calcium: 8.8 mg/dL — ABNORMAL LOW (ref 8.9–10.3)
Chloride: 106 mmol/L (ref 98–111)
Creatinine, Ser: 0.7 mg/dL (ref 0.44–1.00)
GFR, Estimated: >60 mL/min (ref >60)
Glucose, Bld: 102 mg/dL — ABNORMAL HIGH (ref 70–99)
Potassium: 3.6 mmol/L (ref 3.5–5.1)
Sodium: 137 mmol/L (ref 135–145)
Total Bilirubin: 1.4 mg/dL — ABNORMAL HIGH (ref 0.3–1.2)
Total Protein: 6.9 g/dL (ref 6.5–8.1)

## 2020-08-20 LAB — CBC WITH DIFFERENTIAL/PLATELET
Abs Immature Granulocytes: 0.01 10*3/uL (ref 0.00–0.07)
Basophils Absolute: 0 10*3/uL (ref 0.0–0.1)
Basophils Relative: 1 %
Eosinophils Absolute: 0.1 10*3/uL (ref 0.0–0.5)
Eosinophils Relative: 3 %
HCT: 36.2 % (ref 36.0–46.0)
Hematocrit: 34.3 % (ref 34.0–46.6)
Hemoglobin: 12.7 g/dL (ref 11.1–15.9)
Hemoglobin: 13.4 g/dL (ref 12.0–15.0)
Immature Granulocytes: 0 %
Lymphocytes Absolute: 1.3 10*3/uL (ref 0.7–3.1)
Lymphocytes Relative: 37 %
Lymphs Abs: 1.5 10*3/uL (ref 0.7–4.0)
Lymphs: 34 %
MCH: 33.8 pg — ABNORMAL HIGH (ref 26.6–33.0)
MCH: 33.4 pg (ref 26.0–34.0)
MCHC: 37 g/dL — ABNORMAL HIGH (ref 31.5–35.7)
MCHC: 37 g/dL — ABNORMAL HIGH (ref 30.0–36.0)
MCV: 91 fL (ref 79–97)
MCV: 90.3 fL (ref 80.0–100.0)
MID (Absolute): 0.5 10*3/uL (ref 0.1–1.6)
MID: 12 %
Monocytes Absolute: 0.5 10*3/uL (ref 0.1–1.0)
Monocytes Relative: 12 %
Neutro Abs: 1.9 10*3/uL (ref 1.7–7.7)
Neutrophils Absolute: 2.1 10*3/uL (ref 1.4–7.0)
Neutrophils Relative %: 47 %
Neutrophils: 53 %
Platelets: 93 10*3/uL — CL (ref 150–450)
Platelets: 97 10*3/uL — ABNORMAL LOW (ref 150–400)
RBC: 3.76 x10E6/uL — ABNORMAL LOW (ref 3.77–5.28)
RBC: 4.01 MIL/uL (ref 3.87–5.11)
RDW: 13.4 % (ref 11.7–15.4)
RDW: 12.7 % (ref 11.5–15.5)
Smear Review: DECREASED
WBC: 3.9 10*3/uL (ref 3.4–10.8)
WBC: 4.1 10*3/uL (ref 4.0–10.5)
nRBC: 0 % (ref 0.0–0.2)

## 2020-08-20 LAB — PROTIME-INR
INR: 0.9 (ref 0.8–1.2)
Prothrombin Time: 12.5 seconds (ref 11.4–15.2)

## 2020-08-20 LAB — FIBRINOGEN: Fibrinogen: 354 mg/dL (ref 210–475)

## 2020-08-20 LAB — SEDIMENTATION RATE: Sed Rate: 17 mm/hr (ref 0–20)

## 2020-08-20 LAB — LACTIC ACID, PLASMA: Lactic Acid, Venous: 0.7 mmol/L (ref 0.5–1.9)

## 2020-08-20 LAB — PROCALCITONIN: Procalcitonin: 0.1 ng/mL

## 2020-08-20 LAB — C-REACTIVE PROTEIN: CRP: 2.8 mg/dL — ABNORMAL HIGH (ref <1.0)

## 2020-08-20 MED ORDER — GADOBUTROL 1 MMOL/ML IV SOLN
4.0000 mL | Freq: Once | INTRAVENOUS | Status: DC | PRN
  Filled 2020-08-20: qty 4

## 2020-08-20 MED ORDER — PROCHLORPERAZINE EDISYLATE 10 MG/2ML IJ SOLN
10.0000 mg | Freq: Once | INTRAMUSCULAR | Status: AC
  Administered 2020-08-20: 10 mg via INTRAVENOUS
  Filled 2020-08-20: qty 2

## 2020-08-20 MED ORDER — LACTATED RINGERS IV BOLUS
1000.0000 mL | Freq: Once | INTRAVENOUS | Status: AC
  Administered 2020-08-20: 1000 mL via INTRAVENOUS

## 2020-08-20 MED ORDER — DIPHENHYDRAMINE HCL 50 MG/ML IJ SOLN
50.0000 mg | Freq: Once | INTRAMUSCULAR | Status: AC
  Administered 2020-08-20: 50 mg via INTRAVENOUS
  Filled 2020-08-20: qty 1

## 2020-08-20 MED ORDER — MIDAZOLAM HCL 2 MG/2ML IJ SOLN
1.0000 mg | Freq: Once | INTRAMUSCULAR | Status: AC
  Administered 2020-08-20: 1 mg via INTRAVENOUS
  Filled 2020-08-20: qty 2

## 2020-08-20 MED ORDER — MIDAZOLAM HCL 2 MG/2ML IJ SOLN
2.0000 mg | Freq: Once | INTRAMUSCULAR | Status: DC
  Filled 2020-08-20: qty 2

## 2020-08-20 MED ORDER — MAGNESIUM SULFATE 2 GM/50ML IV SOLN
2.0000 g | Freq: Once | INTRAVENOUS | Status: AC
  Administered 2020-08-20: 2 g via INTRAVENOUS
  Filled 2020-08-20: qty 50

## 2020-08-20 MED ORDER — DIPHENHYDRAMINE HCL 50 MG/ML IJ SOLN
12.5000 mg | Freq: Once | INTRAMUSCULAR | Status: AC
  Administered 2020-08-20: 12.5 mg via INTRAVENOUS
  Filled 2020-08-20: qty 1

## 2020-08-20 NOTE — ED Provider Notes (Signed)
[facility] Emergency Department Provider Note  ____________________________________________   Event Date/Time   First MD Initiated Contact with Patient 08/20/20 1528     (approximate)  I have reviewed the triage vital signs and the nursing notes.   HISTORY  Chief Complaint Animal Bite   HPI Stacy Taylor is a 30 y.o. female the past medical history anxiety, asthma, depression, osteoporosis, scoliosis and seizure disorder who presents for assessment of left forearm pain rating up into her left shoulder and severe posterior headache and neck pain associate with fevers up to 101 all of which has been getting worse and she was bit by her dog on 4/23.  Patient states that they she went to urgent care where she had a tetanus update and started on Augmentin.  She states animal control came to her house and verified that her dogs rabies shots are up-to-date.  States she actually went to urgent care again today because she developed worsening headache and was worried she may be having a reaction to a tetanus shot of the no other interventions were made at that time.  She states she was only given 1 place in her forearm.  She denies any other recent injuries or animal bites.  She endorses some photophobia with her headache and states that typically her migraine headaches are frontal, and her left eye does not feel anything like that.  She denies any cough, chest pain, Donnell pain, vomiting, diarrhea, dysuria, any skin discoloration or rash other than at the left forearm where there is some redness or any other acute complaints.  No prior similar episodes.  No other acute concerns at this time.          Past Medical History:  Diagnosis Date  . Anxiety   . Asthma   . Depression   . Fetal alcohol syndrome   . Migraines   . Osteoporosis   . Scoliosis   . Seizures [facility])     Patient Active Problem List   Diagnosis Date Noted  . 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 High  risk medication use 12/18/2019  . At risk for long QT syndrome 12/18/2019  . Schizoaffective disorder, bipolar type ([place]) 12/18/2019  . Tobacco use disorder 12/18/2019  . Migraines   . Osteoporosis without current pathological fracture 10/17/2019  . Asthma   . Anxiety disorder   . History of depression 12/28/2018  . Attention deficit hyperactivity disorder 12/28/2018    Past Surgical History:  Procedure Laterality Date  . FRACTURE SURGERY    . HAND SURGERY    . TONSILECTOMY, ADENOIDECTOMY, BILATERAL MYRINGOTOMY AND TUBES    . WRIST SURGERY      Prior to Admission medications   Medication Sig Start Date End Date Taking? Authorizing Provider  albuterol (VENTOLIN HFA) 108 (90 Base) MCG/ACT inhaler Inhale 2 puffs into the lungs every 6 (six) hours as needed for wheezing or shortness of breath. 10/12/19   Volney American, PA-C  amoxicillin-clavulanate (AUGMENTIN) 875-125 MG tablet Take 1 tablet by mouth 2 (two) times daily. 08/18/20   [provider]  budesonide-formoterol (SYMBICORT) 160-4.5 MCG/ACT inhaler Inhale 2 puffs into the lungs 2 (two) times daily. 10/12/19   Volney American, PA-C  busPIRone (BUSPAR) 15 MG tablet Take by mouth. 05/22/20 05/22/21  [provider]  FLUARIX QUADRIVALENT 0.5 ML injection  02/12/20   [provider]  gabapentin (NEURONTIN) 400 MG capsule Take 1 capsule (400 mg total) by mouth 3 (three) times daily. 05/15/20  Ursula Alert, MD  hydrOXYzine (ATARAX/VISTARIL) 50 MG tablet TAKE 1 TABLET(50 MG) BY MOUTH DAILY AT BEDTIME 08/02/20   Ursula Alert, MD  levonorgestrel (LILETTA, 52 MG,) 19.5 MCG/DAY IUD IUD 1 each by Intrauterine route once.    [provider]  loratadine (CLARITIN) 10 MG tablet Claritin 10 mg tablet  Take 1 tablet every day by oral route as needed.    [provider]  QUEtiapine (SEROQUEL) 50 MG tablet Take by mouth. 05/22/20 06/21/20  [provider]  rizatriptan (MAXALT) 10 MG  tablet Take 1 tablet (10 mg total) by mouth as needed for migraine. May repeat in 2 hours if needed 11/06/19   Volney American, PA-C  Teriparatide, Recombinant, 620 MCG/2.48ML SOPN Inject into the skin daily. Injections    [provider]  Vitamin D, Ergocalciferol, (DRISDOL) 1.25 MG (50000 UNIT) CAPS capsule Take 50,000 Units by mouth once a week. 12/15/19   [provider]    Allergies Ibuprofen  Family History  Problem Relation Age of Onset  . Drug abuse Other   . Mental illness Other     Social History Social History   Tobacco Use  . Smoking status: Current Every Day Smoker    Packs/day: 0.25    Types: Cigarettes  . Smokeless tobacco: Never Used  . Tobacco comment: 1 cig per day reported 03/14/2020  Vaping Use  . Vaping Use: Never used  Substance Use Topics  . Alcohol use: Yes    Comment: socially  . Drug use: Never    Review of Systems  Review of Systems  Constitutional: Positive for chills, fever and malaise/fatigue.  HENT: Negative for sore throat.   Eyes: Positive for photophobia.  Respiratory: Negative for cough and stridor.   Cardiovascular: Negative for chest pain.  Gastrointestinal: Negative for vomiting.  Genitourinary: Negative for dysuria.  Musculoskeletal: Positive for myalgias and neck pain ( L forearm, L shoulder, L neck).  Skin: Negative for rash.  Neurological: Positive for headaches. Negative for seizures and loss of consciousness.  Psychiatric/Behavioral: Negative for suicidal ideas.  All other systems reviewed and are negative.     ____________________________________________   PHYSICAL EXAM:  VITAL SIGNS: ED Triage Vitals  Enc Vitals Group     BP      Pulse      Resp      Temp      Temp src      SpO2      Weight      Height      Head Circumference      Peak Flow      Pain Score      Pain Loc      Pain Edu?      Excl. in [place]?    Vitals:   08/20/20 1645 08/20/20 1733  BP: 105/67 116/77  Pulse: 82 92   Resp: 16 16  Temp:  98.8 F (37.1 C)  SpO2: 99% 99%   Physical Exam Vitals and nursing note reviewed.  Constitutional:      General: She is not in acute distress.    Appearance: She is well-developed. She is ill-appearing.  HENT:     Head: Normocephalic and atraumatic.     Right Ear: External ear normal.     Left Ear: External ear normal.     Nose: Nose normal.     Mouth/Throat:     Mouth: Mucous membranes are dry.  Eyes:     Conjunctiva/sclera: Conjunctivae normal.  Cardiovascular:  Rate and Rhythm: Regular rhythm. Tachycardia present.     Heart sounds: No murmur heard.   Pulmonary:     Effort: Pulmonary effort is normal. No respiratory distress.     Breath sounds: Normal breath sounds.  Abdominal:     Palpations: Abdomen is soft.     Tenderness: There is no abdominal tenderness.  Musculoskeletal:     Cervical back: Neck supple.  Skin:    General: Skin is warm and dry.  Neurological:     Mental Status: She is alert and oriented to person, place, and time.  Psychiatric:        Mood and Affect: Mood normal.     Cranial nerves II through XII grossly intact.  Patient is not meningitic although there are some tenderness over the left trapezius and over the C-spine without palpable abscess fluctuance induration or significant erythema.  Patient has full strength out of bilateral upper and lower extremities and sensation intact in distribution of radial ulnar and median nerves to light touch although she reports that it feels little different on light touch in the dorsum and palmar aspect of the left hand but she does feel this examiner touching her.  Less than 2-second cap refill in both hands and all digits.  2+ radial pulse.  There are 2 puncture wounds with some surrounding erythema on the anterior aspect of the left forearm approximately midway between elbow and wrist.  No significant fluctuance or drainage at this time.  No significant  streaking. ____________________________________________   LABS (all labs ordered are listed, but only abnormal results are displayed)  Labs Reviewed  CBC WITH DIFFERENTIAL/PLATELET - Abnormal; Notable for the following components:      Result Value   MCHC 37.0 (*)    Platelets 97 (*)    All other components within normal limits  COMPREHENSIVE METABOLIC PANEL - Abnormal; Notable for the following components:   Glucose, Bld 102 (*)    Calcium 8.8 (*)    Total Bilirubin 1.4 (*)    All other components within normal limits  CULTURE, BLOOD (ROUTINE X 2)  CULTURE, BLOOD (ROUTINE X 2)  PROCALCITONIN  LACTIC ACID, PLASMA  SEDIMENTATION RATE  PROTIME-INR  FIBRINOGEN  C-REACTIVE PROTEIN  PROCALCITONIN  POC URINE PREG, ED   ____________________________________________  EKG  ____________________________________________  RADIOLOGY  ED MD interpretation: Plain film of the left forearm shows no retained teeth periosteal reaction suggestive of osteomyelitis or any fracture or other abnormality.  No subcu gas.   Official radiology report(s): DG Forearm Left  Result Date: 08/20/2020 CLINICAL DATA:  30 year old female with dog bite to the left upper extremity. EXAM: LEFT FOREARM - 2 VIEW COMPARISON:  None. FINDINGS: Prior internal fixation of distal radius and ulna. The sideplate fixation hardware and screws appear intact. There is no acute fracture or dislocation. The bones are well mineralized. No arthritic changes. The soft tissues unremarkable. IMPRESSION: No acute fracture or dislocation. Electronically Signed   By: Anner Crete M.D.   On: 08/20/2020 16:22    ____________________________________________   PROCEDURES  Procedure(s) performed (including Critical Care):  .1-3 Lead EKG Interpretation Performed by: Lucrezia Starch, MD Authorized by: Lucrezia Starch, MD     Interpretation: normal     ECG rate assessment: normal     Rhythm: sinus rhythm     Ectopy: none      Conduction: normal       ____________________________________________   INITIAL IMPRESSION / ASSESSMENT AND PLAN / ED COURSE  Patient presents with above to history exam for worsening pain in her left forearm after she was bit by a dog now rating up into her shoulder neck and head associated with fevers.  She has been on Augmentin her tetanus was updated and she confirmed that her dog's rabies immunizations are up-to-date.  On arrival she is slight tachycardic with otherwise stable vital signs on room air.  However she does note she has been taking 1 g Tylenol every 6 hours or "days.  She does have a puncture wound on the left forearm without significant surrounding skin changes and has some tenderness over her cervical spine and trapezius without significant overlying skin changes.  She is otherwise neurovascular intact.  Differential includes possible atypical migraine for patient, discitis, l epidural abscess seeded from dog bite infection in the forearm, bacteremia and sepsis and metabolic derangements.  Low suspicion for toxic ingestion.  No other significant trauma to the neck head or elsewhere.  Exam not consistent with compartment syndrome.  Plain films shows no Prialt still reaction suggestive of osteomyelitis retained teeth or fracture.  Compartments are soft and there is some bruising and swelling consistent with recent reported dog bite.  With difficult to exclude mild cellulitis although she is already on appropriate antibiotics.  Blood cultures were sent on arrival due to initial concerns for possible sepsis.  Over do not believe she is septic after initial blood work is returned and she has no fever here leukocytosis lactic acidosis hypotension or other suggestive factors.  I did recommend patient undergo MR brain and C-spine to assess for possible discitis or abscess and while she initially attempted this she felt some anxiety and after receiving Versed and Benadryl she  states she felt much better and no longer wish to undergo MRI.  Explained continued concern for possible acute infection in her neck given reported fevers although she states she understands her could be missing infection in her spine that can be life or limb threatening she still wishes to decline any further MRI studies at this time.  Believe she is capacity to make this decision and refused the study.  CBC has no leukocytosis but does have thrombocytopenia which is somewhat unexpected patient has no significant history of this.  Unclear etiology at this time although she has no bleeding and history exam I think she is safe for discharge from this perspective for close outpatient follow-up.  CMP shows no significant electrolyte or metabolic derangements.  Procalcitonin is 0.1 less consistent with acute bacterial infection or bacteremia.  Lactic acid and ESR nonelevated and not consistent with significant inflammatory response.  INR is unremarkable as it is fibrinogen overall no evidence of DIC.  Patient received IV fluids magnesium Compazine and Benadryl and on reassessment states she felt much better.  I still recommended she undergo MRI as I do not clearly have an expiration for her fever as I do not think local cellulitis from dog bite necessarily explain this I am concerned about possible abscess or discitis tracking up on the C-spine to the posterior scalp think she has capacity to refuse MRI.  Advised her to continue taking Augmentin.  Discharged stable condition.  Recommended close outpatient follow-up for wound recheck in 2 to 3 days.        ____________________________________________   FINAL CLINICAL IMPRESSION(S) / ED DIAGNOSES  Final diagnoses:  Dog bite, subsequent encounter  Neck pain  Acute nonintractable headache, unspecified headache type  Fever, unspecified fever cause  Thrombocytopenia ([place])  Medications  gadobutrol (GADAVIST) 1 MMOL/ML injection 4 mL (has no  administration in time range)  prochlorperazine (COMPAZINE) injection 10 mg (10 mg Intravenous Given 08/20/20 1620)  diphenhydrAMINE (BENADRYL) injection 12.5 mg (12.5 mg Intravenous Given 08/20/20 1618)  lactated ringers bolus 1,000 mL (0 mLs Intravenous Stopped 08/20/20 1800)  magnesium sulfate IVPB 2 g 50 mL (0 g Intravenous Stopped 08/20/20 1731)  diphenhydrAMINE (BENADRYL) injection 50 mg (50 mg Intravenous Given 08/20/20 1732)  midazolam (VERSED) injection 1 mg (1 mg Intravenous Given 08/20/20 1732)     ED Discharge Orders    None       Note:  This document was prepared using Dragon voice recognition software and may include unintentional dictation errors.   Lucrezia Starch, MD 08/20/20 [phone]

## 2020-08-20 NOTE — ED Triage Notes (Signed)
Pt comes with c/o dog bite to left arm. Pt states this happened on Saturday. Pt states it has gotten worse and antibiotics haven't helped.

## 2020-08-20 NOTE — Progress Notes (Signed)
BP 113/73   Pulse (!) 105   Temp 98.5 F (36.9 C)   Wt 96 lb (43.5 kg)   SpO2 100%   BMI 22.31 kg/m    Subjective:    Patient ID: Stacy Taylor, female    DOB: 1990-05-17, 30 y.o.   MRN: [id number]  HPI: Stacy Taylor is a 30 y.o. female  Chief Complaint  Patient presents with  . Animal Bite    Patient states she was bit by her dog on Saturday 08/17/20. Patient received tetanus shot on Saturday. Has been running a fever since Saturday night. Patient states she has left arm pain. Patient states she is having a bad headache that her migraine medication is not helping.    SKIN INFECTION- Jaycie was bit by her own dog on Saturday and went to the urgent care that night. She was started on augmentin and given her tetanus shot, but she has been getting worse. She has been having fevers and chills and significant pain in her arm with pain shooting down her arm and into her hand. She has also been having severe headaches with photophobia and neck tightness. She notes that her fever has gotten up to 102.6- she's been on tylenol around the clock since then.  Duration: 3 days Location: L arm History of trauma in area: yes Pain: yes Quality: shooting and sharp and aching Severity: severe Redness: yes Swelling: yes Oozing: no Pus: no Fevers: yes Nausea/vomiting: yes Status: worse Treatments attempted:antibiotics  Tetanus: UTD   Relevant past medical, surgical, family and social history reviewed and updated as indicated. Interim medical history since our last visit reviewed. Allergies and medications reviewed and updated.  Review of Systems  Constitutional: Positive for chills, diaphoresis, fatigue and fever. Negative for activity change, appetite change and unexpected weight change.  HENT: Negative.   Eyes: Positive for photophobia. Negative for pain, discharge, redness, itching and visual disturbance.  Respiratory: Negative.   Cardiovascular: Negative.   Gastrointestinal:  Negative.   Musculoskeletal: Positive for myalgias, neck pain and neck stiffness. Negative for arthralgias, back pain, gait problem and joint swelling.  Skin: Positive for color change and wound. Negative for pallor and rash.  Neurological: Positive for headaches. Negative for dizziness, tremors, seizures, syncope, facial asymmetry, speech difficulty, weakness, light-headedness and numbness.  Hematological: Negative.   Psychiatric/Behavioral: Negative.     Per HPI unless specifically indicated above     Objective:    BP 113/73   Pulse (!) 105   Temp 98.5 F (36.9 C)   Wt 96 lb (43.5 kg)   SpO2 100%   BMI 22.31 kg/m   Wt Readings from Last 3 Encounters:  08/20/20 96 lb (43.5 kg)  02/16/20 89 lb 12.8 oz (40.7 kg)  11/16/19 95 lb (43.1 kg)    Physical Exam Vitals and nursing note reviewed.  Constitutional:      General: She is not in acute distress.    Appearance: Normal appearance. She is ill-appearing. She is not toxic-appearing or diaphoretic.  HENT:     Head: Normocephalic and atraumatic.     Right Ear: External ear normal.     Left Ear: External ear normal.     Nose: Nose normal.     Mouth/Throat:     Mouth: Mucous membranes are moist.     Pharynx: Oropharynx is clear.  Eyes:     General: No scleral icterus.       Right eye: No discharge.        Left  eye: No discharge.     Extraocular Movements: Extraocular movements intact.     Conjunctiva/sclera: Conjunctivae normal.     Pupils: Pupils are equal, round, and reactive to light.  Cardiovascular:     Rate and Rhythm: Normal rate and regular rhythm.     Pulses: Normal pulses.     Heart sounds: Normal heart sounds. No murmur heard. No friction rub. No gallop.   Pulmonary:     Effort: Pulmonary effort is normal. No respiratory distress.     Breath sounds: Normal breath sounds. No stridor. No wheezing, rhonchi or rales.  Chest:     Chest wall: No tenderness.  Musculoskeletal:        General: Swelling and  tenderness present.     Cervical back: Normal range of motion and neck supple.     Comments: Significant tenderness and heat over L forearm with 2 lacerations and bruising.   Skin:    General: Skin is warm and dry.     Capillary Refill: Capillary refill takes less than 2 seconds.     Coloration: Skin is not jaundiced or pale.     Findings: Bruising and erythema present. No lesion or rash.  Neurological:     General: No focal deficit present.     Mental Status: She is alert and oriented to person, place, and time. Mental status is at baseline.  Psychiatric:        Mood and Affect: Mood normal.        Behavior: Behavior normal.        Thought Content: Thought content normal.        Judgment: Judgment normal.     Results for orders placed or performed during the hospital encounter of 04/20/20  Basic metabolic panel - if new onset seizures  Result Value Ref Range   Sodium 140 135 - 145 mmol/L   Potassium 3.5 3.5 - 5.1 mmol/L   Chloride 106 98 - 111 mmol/L   CO2 22 22 - 32 mmol/L   Glucose, Bld 101 (H) 70 - 99 mg/dL   BUN 16 6 - 20 mg/dL   Creatinine, Ser 4.09 0.44 - 1.00 mg/dL   Calcium 9.4 8.9 - 81.1 mg/dL   GFR, Estimated >91 >47 mL/min   Anion gap 12 5 - 15  CBC - if new onset seizures  Result Value Ref Range   WBC 10.8 (H) 4.0 - 10.5 K/uL   RBC 4.14 3.87 - 5.11 MIL/uL   Hemoglobin 13.5 12.0 - 15.0 g/dL   HCT 82.9 56.2 - 13.0 %   MCV 89.4 80.0 - 100.0 fL   MCH 32.6 26.0 - 34.0 pg   MCHC 36.5 (H) 30.0 - 36.0 g/dL   RDW 86.5 78.4 - 69.6 %   Platelets 176 150 - 400 K/uL   nRBC 0.0 0.0 - 0.2 %  CBG monitoring, ED  Result Value Ref Range   Glucose-Capillary 99 70 - 99 mg/dL      Assessment & Plan:   Problem List Items Addressed This Visit   None   Visit Diagnoses    Fever, unspecified fever cause    -  Primary   Due to dog bite. Has failed augmentin. No need for rabies shot. UTD on tetanus. Will get her to ER for evaluation and ?IV abx. Call with concerns.     Relevant Orders   CBC With Differential/Platelet   Dog bite, subsequent encounter       Has failed augmentin. No need  for rabies shot. UTD on tetanus. Will get her to ER for evaluation and ?IV abx. Call with concerns.        Follow up plan: Return if symptoms worsen or fail to improve.

## 2020-08-21 ENCOUNTER — Encounter: Payer: Self-pay | Admitting: Emergency Medicine

## 2020-08-21 ENCOUNTER — Emergency Department
Admission: EM | Admit: 2020-08-21 | Discharge: 2020-08-21 | Disposition: A | Discharge status: Home / Self Care | Payer: PPO | Attending: Emergency Medicine | Admitting: Emergency Medicine

## 2020-08-21 ENCOUNTER — Telehealth: Payer: Self-pay

## 2020-08-21 ENCOUNTER — Other Ambulatory Visit: Payer: Self-pay

## 2020-08-21 DIAGNOSIS — G43809 Other migraine, not intractable, without status migrainosus: Secondary | ICD-10-CM | POA: Diagnosis not present

## 2020-08-21 DIAGNOSIS — F17210 Nicotine dependence, cigarettes, uncomplicated: Secondary | ICD-10-CM | POA: Diagnosis not present

## 2020-08-21 DIAGNOSIS — S51852A Open bite of left forearm, initial encounter: Secondary | ICD-10-CM | POA: Diagnosis not present

## 2020-08-21 DIAGNOSIS — W540XXA Bitten by dog, initial encounter: Secondary | ICD-10-CM | POA: Diagnosis not present

## 2020-08-21 DIAGNOSIS — H53149 Visual discomfort, unspecified: Secondary | ICD-10-CM | POA: Diagnosis not present

## 2020-08-21 DIAGNOSIS — J45909 Unspecified asthma, uncomplicated: Secondary | ICD-10-CM | POA: Insufficient documentation

## 2020-08-21 DIAGNOSIS — Z20822 Contact with and (suspected) exposure to covid-19: Secondary | ICD-10-CM | POA: Diagnosis not present

## 2020-08-21 DIAGNOSIS — Z7951 Long term (current) use of inhaled steroids: Secondary | ICD-10-CM | POA: Diagnosis not present

## 2020-08-21 DIAGNOSIS — G43909 Migraine, unspecified, not intractable, without status migrainosus: Secondary | ICD-10-CM | POA: Diagnosis not present

## 2020-08-21 LAB — CBC WITH DIFFERENTIAL/PLATELET
Abs Immature Granulocytes: 0.02 10*3/uL (ref 0.00–0.07)
Basophils Absolute: 0 10*3/uL (ref 0.0–0.1)
Basophils Relative: 1 %
Eosinophils Absolute: 0.1 10*3/uL (ref 0.0–0.5)
Eosinophils Relative: 3 %
HCT: 32.5 % — ABNORMAL LOW (ref 36.0–46.0)
Hemoglobin: 11.7 g/dL — ABNORMAL LOW (ref 12.0–15.0)
Immature Granulocytes: 0 %
Lymphocytes Relative: 36 %
Lymphs Abs: 1.8 10*3/uL (ref 0.7–4.0)
MCH: 32.8 pg (ref 26.0–34.0)
MCHC: 36 g/dL (ref 30.0–36.0)
MCV: 91 fL (ref 80.0–100.0)
Monocytes Absolute: 0.5 10*3/uL (ref 0.1–1.0)
Monocytes Relative: 10 %
Neutro Abs: 2.6 10*3/uL (ref 1.7–7.7)
Neutrophils Relative %: 50 %
Platelets: 95 10*3/uL — ABNORMAL LOW (ref 150–400)
RBC: 3.57 MIL/uL — ABNORMAL LOW (ref 3.87–5.11)
RDW: 12.8 % (ref 11.5–15.5)
WBC: 5.1 10*3/uL (ref 4.0–10.5)
nRBC: 0 % (ref 0.0–0.2)

## 2020-08-21 LAB — COMPREHENSIVE METABOLIC PANEL
ALT: 17 U/L (ref 0–44)
AST: 19 U/L (ref 15–41)
Albumin: 3.7 g/dL (ref 3.5–5.0)
Alkaline Phosphatase: 40 U/L (ref 38–126)
Anion gap: 6 (ref 5–15)
BUN: 14 mg/dL (ref 6–20)
CO2: 25 mmol/L (ref 22–32)
Calcium: 8.7 mg/dL — ABNORMAL LOW (ref 8.9–10.3)
Chloride: 107 mmol/L (ref 98–111)
Creatinine, Ser: 0.61 mg/dL (ref 0.44–1.00)
GFR, Estimated: >60 mL/min (ref >60)
Glucose, Bld: 116 mg/dL — ABNORMAL HIGH (ref 70–99)
Potassium: 3.6 mmol/L (ref 3.5–5.1)
Sodium: 138 mmol/L (ref 135–145)
Total Bilirubin: 1.4 mg/dL — ABNORMAL HIGH (ref 0.3–1.2)
Total Protein: 6.1 g/dL — ABNORMAL LOW (ref 6.5–8.1)

## 2020-08-21 LAB — RESP PANEL BY RT-PCR (FLU A&B, COVID) ARPGX2
Influenza A by PCR: NEGATIVE
Influenza B by PCR: NEGATIVE
SARS Coronavirus 2 by RT PCR: NEGATIVE

## 2020-08-21 LAB — PROCALCITONIN: Procalcitonin: <0.1 ng/mL

## 2020-08-21 LAB — LACTIC ACID, PLASMA: Lactic Acid, Venous: 0.8 mmol/L (ref 0.5–1.9)

## 2020-08-21 MED ORDER — SODIUM CHLORIDE 0.9 % IV BOLUS
1000.0000 mL | Freq: Once | INTRAVENOUS | Status: AC
  Administered 2020-08-21: 1000 mL via INTRAVENOUS

## 2020-08-21 MED ORDER — BUTALBITAL-APAP-CAFFEINE 50-325-40 MG PO TABS: 1.0000 | ORAL_TABLET | Freq: Four times a day (QID) | ORAL | 0 refills | Status: DC | PRN

## 2020-08-21 MED ORDER — DIPHENHYDRAMINE HCL 50 MG/ML IJ SOLN
50.0000 mg | Freq: Once | INTRAMUSCULAR | Status: AC
  Administered 2020-08-21: 50 mg via INTRAVENOUS
  Filled 2020-08-21: qty 1

## 2020-08-21 MED ORDER — METOCLOPRAMIDE HCL 5 MG/ML IJ SOLN
10.0000 mg | Freq: Once | INTRAMUSCULAR | Status: AC
  Administered 2020-08-21: 10 mg via INTRAVENOUS
  Filled 2020-08-21: qty 2

## 2020-08-21 MED ORDER — KETOROLAC TROMETHAMINE 30 MG/ML IJ SOLN
30.0000 mg | Freq: Once | INTRAMUSCULAR | Status: AC
  Administered 2020-08-21: 30 mg via INTRAVENOUS
  Filled 2020-08-21: qty 1

## 2020-08-21 NOTE — ED Provider Notes (Signed)
[facility] Emergency Department Provider Note  Time seen: 9:51 AM  I have reviewed the triage vital signs and the nursing notes.   HISTORY  Chief Complaint Animal Bite   HPI Stacy Taylor is a 30 y.o. female with a past medical history of anxiety, asthma, chronic migraines, schizoaffective disorder, presents to the emergency department for evaluation of a dog bite that occurred 4 days ago.  According to the patient she was bit by a dog 4 days ago.  She states she is sure the dog is up-to-date on vaccinations including rabies.  Patient was seen at an urgent care at that time placed on antibiotics and discharged home.  Patient currently taking Augmentin.  Patient states since Saturday she has been experiencing intermittent fevers as high as 102 at home.  Patient came to the emergency department yesterday for evaluation at that time was afebrile but states she had taken Tylenol recently had an extensive and reassuring work-up.  Patient states she had a headache but felt somewhat better after medications yesterday and went home.  Patient states today she continued to have a headache was able to get someone to cover her shift at work so she came to the emergency department for evaluation.  Patient denies any weakness or numbness of any arm or leg.  States a history of chronic migraines, is prescribed triptans for her migraines.  States this feels identical to her chronic migraines but it is not being resolved with Tylenol and triptan's at home.  Patient believes she continues to have a fever but took Tylenol this morning for her headache.   Past Medical History:  Diagnosis Date  . Anxiety   . Asthma   . Depression   . Fetal alcohol syndrome   . Migraines   . Osteoporosis   . Scoliosis   . Seizures [facility])     Patient Active Problem List   Diagnosis Date Noted  . 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 High risk medication use 12/18/2019  . At risk for long QT syndrome  12/18/2019  . Schizoaffective disorder, bipolar type (HCC) 12/18/2019  . Tobacco use disorder 12/18/2019  . Migraines   . Osteoporosis without current pathological fracture 10/17/2019  . Asthma   . Anxiety disorder   . History of depression 12/28/2018  . Attention deficit hyperactivity disorder 12/28/2018    Past Surgical History:  Procedure Laterality Date  . FRACTURE SURGERY    . HAND SURGERY    . TONSILECTOMY, ADENOIDECTOMY, BILATERAL MYRINGOTOMY AND TUBES    . WRIST SURGERY      Prior to Admission medications   Medication Sig Start Date End Date Taking? Authorizing Provider  albuterol (VENTOLIN HFA) 108 (90 Base) MCG/ACT inhaler Inhale 2 puffs into the lungs every 6 (six) hours as needed for wheezing or shortness of breath. 10/12/19   Particia Nearing, PA-C  amoxicillin-clavulanate (AUGMENTIN) 875-125 MG tablet Take 1 tablet by mouth 2 (two) times daily. 08/18/20   [provider]  budesonide-formoterol (SYMBICORT) 160-4.5 MCG/ACT inhaler Inhale 2 puffs into the lungs 2 (two) times daily. 10/12/19   Particia Nearing, PA-C  busPIRone (BUSPAR) 15 MG tablet Take by mouth. 05/22/20 05/22/21  [provider]  FLUARIX QUADRIVALENT 0.5 ML injection  02/12/20   [provider]  gabapentin (NEURONTIN) 400 MG capsule Take 1 capsule (400 mg total) by mouth 3 (three) times daily. 05/15/20   Jomarie Longs, MD  hydrOXYzine (ATARAX/VISTARIL) 50 MG tablet TAKE 1 TABLET(50 MG) BY MOUTH DAILY AT  BEDTIME 08/02/20   Jomarie Longs, MD  levonorgestrel (LILETTA, 52 MG,) 19.5 MCG/DAY IUD IUD 1 each by Intrauterine route once.    [provider]  loratadine (CLARITIN) 10 MG tablet Claritin 10 mg tablet  Take 1 tablet every day by oral route as needed.    [provider]  QUEtiapine (SEROQUEL) 50 MG tablet Take by mouth. 05/22/20 06/21/20  [provider]  rizatriptan (MAXALT) 10 MG tablet Take 1 tablet (10 mg total) by mouth as needed for  migraine. May repeat in 2 hours if needed 11/06/19   Particia Nearing, PA-C  Teriparatide, Recombinant, 620 MCG/2.48ML SOPN Inject into the skin daily. Injections    [provider]  Vitamin D, Ergocalciferol, (DRISDOL) 1.25 MG (50000 UNIT) CAPS capsule Take 50,000 Units by mouth once a week. 12/15/19   [provider]    Allergies  Allergen Reactions  . Ibuprofen     Abdominal pain, upset stomach    Family History  Problem Relation Age of Onset  . Drug abuse Other   . Mental illness Other     Social History Social History   Tobacco Use  . Smoking status: Current Every Day Smoker    Packs/day: 0.25    Types: Cigarettes  . Smokeless tobacco: Never Used  . Tobacco comment: 1 cig per day reported 03/14/2020  Vaping Use  . Vaping Use: Never used  Substance Use Topics  . Alcohol use: Yes    Comment: socially  . Drug use: Never    Review of Systems Constitutional: Reported fever at home. Cardiovascular: Negative for chest pain. Respiratory: Negative for shortness of breath. Gastrointestinal: Negative for abdominal pain, vomiting  Musculoskeletal: Dog bite to left forearm Skin: Dog bite to left forearm Neurological: Moderate headache consistent with past migraines. All other ROS negative  ____________________________________________   PHYSICAL EXAM:  VITAL SIGNS: ED Triage Vitals  Enc Vitals Group     BP 08/21/20 0902 117/79     Pulse Rate 08/21/20 0902 78     Resp 08/21/20 0902 18     Temp 08/21/20 0902 98.3 F (36.8 C)     Temp Source 08/21/20 0902 Oral     SpO2 08/21/20 0902 100 %     Weight 08/21/20 0903 96 lb (43.5 kg)     Height 08/21/20 0903 4\' 7"  (1.397 m)     Head Circumference --      Peak Flow --      Pain Score 08/21/20 0903 6     Pain Loc --      Pain Edu? --      Excl. in GC? --    Constitutional: Patient is awake and alert, she is oriented and overall well-appearing but does wear sunglasses due to photophobia. Eyes:  Photophobia otherwise normal exam ENT      Head: Normocephalic and atraumatic.      Mouth/Throat: Mucous membranes are moist. Cardiovascular: Normal rate, regular rhythm.  Respiratory: Normal respiratory effort without tachypnea nor retractions. Breath sounds are clear  Gastrointestinal: Soft and nontender. No distention.   Musculoskeletal: Patient has 2 small puncture wounds to the left forearm.  No surrounding erythema or induration to suggest cellulitis or abscess.  No enlarged axillary lymph nodes palpated.  Soft compartment with no sign of edema. Neurologic:  Normal speech and language.  Skin:  Skin is warm, dry.  2 puncture wounds to the left forearm, overall appear well. Psychiatric: Mood and affect are normal.  ____________________________________________   INITIAL  IMPRESSION / ASSESSMENT AND PLAN / ED COURSE  Pertinent labs & imaging results that were available during my care of the patient were reviewed by me and considered in my medical decision making (see chart for details).   Patient presents to the emergency department for headache and reported fever at home.  Patient had a dog bite on Saturday and since Saturday evening states she has been experiencing fever at home.  Patient came to the emergency department yesterday.  I reviewed the patient's chart patient had extensive work-up including labs including inflammatory markers, x-ray to rule out foreign body.  Overall patient had a reassuring/negative work-up.  Patient states she continues to have a headache which she states feels like her typical chronic migraines but has not resolved with triptans.  Patient is afebrile with reassuring vitals in the emergency department but states she took Tylenol early this morning.  Patient appears to have a chronic migraine, no signs for meningitis or encephalitis.  No meningeal signs on exam.  We will check labs including a lactic acid as well as procalcitonin.  We will IV hydrate and treat with  a migraine cocktail medications will be continue to closely monitor the patient.  Do not believe at this time MRI imaging would be of much utility for the patient given no neurologic signs or meningeal signs and normal work-up yesterday.  Patient is on an appropriate course of antibiotics for dog bite.  Patient's work-up today again very reassuring.  Normal white blood cell count.  Normal lactic acid.  Normal procalcitonin.  Platelets are slightly low however in reviewing the patient's chart her platelets are always on the lower end.  Patient will be discharged home.  She is on appropriate antibiotics.  We will have the patient follow-up with her doctor.  Discussed return precautions.  Stacy Taylor was evaluated in Emergency Department on 08/21/2020 for the symptoms described in the history of present illness. She was evaluated in the context of the global COVID-19 pandemic, which necessitated consideration that the patient might be at risk for infection with the SARS-CoV-2 virus that causes COVID-19. Institutional protocols and algorithms that pertain to the evaluation of patients at risk for COVID-19 are in a state of rapid change based on information released by regulatory bodies including the CDC and federal and state organizations. These policies and algorithms were followed during the patient's care in the ED.  ____________________________________________   FINAL CLINICAL IMPRESSION(S) / ED DIAGNOSES  Dog bite Migraine   Minna Antis, MD 08/21/20 1148

## 2020-08-21 NOTE — Telephone Encounter (Signed)
Copied from CRM [id number]. Topic: General - Other >> Aug 21, 2020  8:25 AM Jaquita Rector A wrote: Reason for CRM: Patient called in to inform Dr Laural Benes that she went to the ER but did not stay for treatment due to not having coverage for work. Say that she will go to the ER this morning so that she can get the test that they wanted her to have yesterday. Any questions please call Ph# [phone]

## 2020-08-21 NOTE — ED Triage Notes (Signed)
Pt states left AMA yesterday due to MRI taking too long, states was told by MD that MD was concerned for spread of infection due to enlarged lymph nodes. Pt reports some confusion, but is A&O x4, ambulatory without difficulty at this time.

## 2020-08-25 LAB — CULTURE, BLOOD (ROUTINE X 2)
Culture: NO GROWTH
Culture: NO GROWTH
Special Requests: ADEQUATE

## 2020-10-10 DIAGNOSIS — F411 Generalized anxiety disorder: Secondary | ICD-10-CM | POA: Diagnosis not present

## 2020-10-10 DIAGNOSIS — H53149 Visual discomfort, unspecified: Secondary | ICD-10-CM | POA: Insufficient documentation

## 2020-10-10 DIAGNOSIS — R42 Dizziness and giddiness: Secondary | ICD-10-CM | POA: Diagnosis not present

## 2020-10-10 DIAGNOSIS — R4689 Other symptoms and signs involving appearance and behavior: Secondary | ICD-10-CM | POA: Insufficient documentation

## 2020-10-10 DIAGNOSIS — G43109 Migraine with aura, not intractable, without status migrainosus: Secondary | ICD-10-CM | POA: Diagnosis not present

## 2020-10-30 DIAGNOSIS — M818 Other osteoporosis without current pathological fracture: Secondary | ICD-10-CM | POA: Diagnosis not present

## 2020-10-30 DIAGNOSIS — E559 Vitamin D deficiency, unspecified: Secondary | ICD-10-CM | POA: Diagnosis not present

## 2020-11-10 ENCOUNTER — Other Ambulatory Visit: Payer: Self-pay | Admitting: Family Medicine

## 2020-11-11 NOTE — Telephone Encounter (Signed)
Requested medication (s) are due for refill today: expired medication  Requested medication (s) are on the active medication list: yes   Last refill:  10/12/19 #1 inhaler 11 refills   Future visit scheduled: no  Notes to clinic:  expired medication. Do you want to renew Rx?     Requested Prescriptions  Pending Prescriptions Disp Refills   budesonide-formoterol (SYMBICORT) 160-4.5 MCG/ACT inhaler [Pharmacy Med Name: BUDESONIDE/FORM 160/4.5MCG(120 INH)] 10.2 g     Sig: INHALE 2 PUFFS INTO THE LUNGS TWICE DAILY      Pulmonology:  [employer] Passed - 11/10/2020  8:01 PM      Passed - Valid encounter within last 12 months    Recent Outpatient Visits           2 months ago Fever, unspecified fever cause   [facility], Megan P, DO   8 months ago Hearing loss due to cerumen impaction, bilateral   [facility], Megan P, DO   12 months ago Pain of finger of right hand   [facility] Particia Nearing, [place]   1 year ago Migraine without aura and without status migrainosus, not intractable   [facility] [place], [place], [place]   1 year ago Osteoporosis without current pathological fracture, unspecified osteoporosis type   [facility] Roosvelt Maser [place], [place]

## 2020-11-11 NOTE — Telephone Encounter (Signed)
Pt has apt on 11/14/2020

## 2020-11-14 ENCOUNTER — Encounter: Payer: Self-pay | Admitting: Family Medicine

## 2020-11-14 ENCOUNTER — Ambulatory Visit (INDEPENDENT_AMBULATORY_CARE_PROVIDER_SITE_OTHER): Payer: PPO | Admitting: Family Medicine

## 2020-11-14 ENCOUNTER — Other Ambulatory Visit: Payer: Self-pay

## 2020-11-14 VITALS — BP 94/58 | HR 94 | Temp 98.5°F | Ht <= 58 in | Wt 94.6 lb

## 2020-11-14 DIAGNOSIS — J4540 Moderate persistent asthma, uncomplicated: Secondary | ICD-10-CM

## 2020-11-14 DIAGNOSIS — F411 Generalized anxiety disorder: Secondary | ICD-10-CM

## 2020-11-14 DIAGNOSIS — F250 Schizoaffective disorder, bipolar type: Secondary | ICD-10-CM

## 2020-11-14 DIAGNOSIS — F909 Attention-deficit hyperactivity disorder, unspecified type: Secondary | ICD-10-CM | POA: Diagnosis not present

## 2020-11-14 DIAGNOSIS — R4689 Other symptoms and signs involving appearance and behavior: Secondary | ICD-10-CM | POA: Diagnosis not present

## 2020-11-14 DIAGNOSIS — G43109 Migraine with aura, not intractable, without status migrainosus: Secondary | ICD-10-CM | POA: Diagnosis not present

## 2020-11-14 DIAGNOSIS — R569 Unspecified convulsions: Secondary | ICD-10-CM | POA: Diagnosis not present

## 2020-11-14 NOTE — Assessment & Plan Note (Signed)
Has not seen her psychiatrist since March, unable to get in touch with her. Will call over to see if we can get her back in. If not, new referral placed today. 

## 2020-11-14 NOTE — Progress Notes (Signed)
BP (!) 94/58   Pulse 94   Temp 98.5 F (36.9 C) (Oral)   Ht 4\' 9"  (1.448 m)   Wt 94 lb 9.6 oz (42.9 kg)   LMP  (LMP Unknown)   SpO2 99%   BMI 20.47 kg/m    Subjective:    Patient ID: Stacy Taylor, female    DOB: [birth year], 30 y.o.   MRN: [id number]  HPI: Stacy Taylor is a 30 y.o. female  Chief Complaint  Patient presents with   Anxiety   Depression   ADHD   ASTHMA Asthma status: exacerbated Satisfied with current treatment?: no Albuterol/rescue inhaler frequency: 1x a day Dyspnea frequency: only with the heat Wheezing frequency: only with the heat Cough frequency: only with the heat Nocturnal symptom frequency: none Limitation of activity: no Current upper respiratory symptoms: no Aerochamber/spacer use: no Visits to ER or Urgent Care in past year: no Pneumovax: Up to Date Influenza: Up to Date  Relevant past medical, surgical, family and social history reviewed and updated as indicated. Interim medical history since our last visit reviewed. Allergies and medications reviewed and updated.  Review of Systems  Constitutional: Negative.   Respiratory: Negative.    Cardiovascular: Negative.   Gastrointestinal: Negative.   Musculoskeletal: Negative.   Psychiatric/Behavioral:  Negative for agitation, behavioral problems, confusion, decreased concentration, dysphoric mood, hallucinations, self-injury, sleep disturbance and suicidal ideas. The patient is nervous/anxious. The patient is not hyperactive.    Per HPI unless specifically indicated above     Objective:    BP (!) 94/58   Pulse 94   Temp 98.5 F (36.9 C) (Oral)   Ht 4\' 9"  (1.448 m)   Wt 94 lb 9.6 oz (42.9 kg)   LMP  (LMP Unknown)   SpO2 99%   BMI 20.47 kg/m   Wt Readings from Last 3 Encounters:  11/14/20 94 lb 9.6 oz (42.9 kg)  08/21/20 96 lb (43.5 kg)  08/20/20 96 lb (43.5 kg)    Physical Exam Vitals and nursing note reviewed.  Constitutional:      General: She is not in acute  distress.    Appearance: Normal appearance. She is not ill-appearing, toxic-appearing or diaphoretic.  HENT:     Head: Normocephalic and atraumatic.     Right Ear: External ear normal.     Left Ear: External ear normal.     Nose: Nose normal.     Mouth/Throat:     Mouth: Mucous membranes are moist.     Pharynx: Oropharynx is clear.  Eyes:     General: No scleral icterus.       Right eye: No discharge.        Left eye: No discharge.     Extraocular Movements: Extraocular movements intact.     Conjunctiva/sclera: Conjunctivae normal.     Pupils: Pupils are equal, round, and reactive to light.  Cardiovascular:     Rate and Rhythm: Normal rate and regular rhythm.     Pulses: Normal pulses.     Heart sounds: Normal heart sounds. No murmur heard.   No friction rub. No gallop.  Pulmonary:     Effort: Pulmonary effort is normal. No respiratory distress.     Breath sounds: Normal breath sounds. No stridor. No wheezing, rhonchi or rales.  Chest:     Chest wall: No tenderness.  Musculoskeletal:        General: Normal range of motion.     Cervical back: Normal range of motion and neck supple.  Skin:    General: Skin is warm and dry.     Capillary Refill: Capillary refill takes less than 2 seconds.     Coloration: Skin is not jaundiced or pale.     Findings: No bruising, erythema, lesion or rash.  Neurological:     General: No focal deficit present.     Mental Status: She is alert and oriented to person, place, and time. Mental status is at baseline.  Psychiatric:        Mood and Affect: Mood normal.        Behavior: Behavior normal.        Thought Content: Thought content normal.        Judgment: Judgment normal.    Results for orders placed or performed during the hospital encounter of 08/21/20  Resp Panel by RT-PCR (Flu A&B, Covid) Nasopharyngeal Swab   Specimen: Nasopharyngeal Swab; Nasopharyngeal(NP) swabs in vial transport medium  Result Value Ref Range   SARS Coronavirus 2  by RT PCR NEGATIVE NEGATIVE   Influenza A by PCR NEGATIVE NEGATIVE   Influenza B by PCR NEGATIVE NEGATIVE  CBC with Differential  Result Value Ref Range   WBC 5.1 4.0 - 10.5 K/uL   RBC 3.57 (L) 3.87 - 5.11 MIL/uL   Hemoglobin 11.7 (L) 12.0 - 15.0 g/dL   HCT 62.3 (L) 76.2 - 83.1 %   MCV 91.0 80.0 - 100.0 fL   MCH 32.8 26.0 - 34.0 pg   MCHC 36.0 30.0 - 36.0 g/dL   RDW 51.7 61.6 - 07.3 %   Platelets 95 (L) 150 - 400 K/uL   nRBC 0.0 0.0 - 0.2 %   Neutrophils Relative % 50 %   Neutro Abs 2.6 1.7 - 7.7 K/uL   Lymphocytes Relative 36 %   Lymphs Abs 1.8 0.7 - 4.0 K/uL   Monocytes Relative 10 %   Monocytes Absolute 0.5 0.1 - 1.0 K/uL   Eosinophils Relative 3 %   Eosinophils Absolute 0.1 0.0 - 0.5 K/uL   Basophils Relative 1 %   Basophils Absolute 0.0 0.0 - 0.1 K/uL   Immature Granulocytes 0 %   Abs Immature Granulocytes 0.02 0.00 - 0.07 K/uL  Comprehensive metabolic panel  Result Value Ref Range   Sodium 138 135 - 145 mmol/L   Potassium 3.6 3.5 - 5.1 mmol/L   Chloride 107 98 - 111 mmol/L   CO2 25 22 - 32 mmol/L   Glucose, Bld 116 (H) 70 - 99 mg/dL   BUN 14 6 - 20 mg/dL   Creatinine, Ser 7.10 0.44 - 1.00 mg/dL   Calcium 8.7 (L) 8.9 - 10.3 mg/dL   Total Protein 6.1 (L) 6.5 - 8.1 g/dL   Albumin 3.7 3.5 - 5.0 g/dL   AST 19 15 - 41 U/L   ALT 17 0 - 44 U/L   Alkaline Phosphatase 40 38 - 126 U/L   Total Bilirubin 1.4 (H) 0.3 - 1.2 mg/dL   GFR, Estimated >62 >69 mL/min   Anion gap 6 5 - 15  Lactic acid, plasma  Result Value Ref Range   Lactic Acid, Venous 0.8 0.5 - 1.9 mmol/L  Procalcitonin - Baseline  Result Value Ref Range   Procalcitonin <0.10 ng/mL      Assessment & Plan:   Problem List Items Addressed This Visit       Respiratory   Asthma - Primary    Under good control on current regimen. Continue current regimen. Continue to monitor. Call with any  concerns. Refills given.           Other   Anxiety disorder    Has not seen her psychiatrist since March, unable  to get in touch with her. Will call over to see if we can get her back in. If not, new referral placed today.       Relevant Medications   nortriptyline (PAMELOR) 10 MG capsule   Other Relevant Orders   Ambulatory referral to Psychiatry   Attention deficit hyperactivity disorder    Has not seen her psychiatrist since March, unable to get in touch with her. Will call over to see if we can get her back in. If not, new referral placed today.       Relevant Orders   Ambulatory referral to Psychiatry   Schizoaffective disorder, bipolar type [facility])    Has not seen her psychiatrist since March, unable to get in touch with her. Will call over to see if we can get her back in. If not, new referral placed today.       Relevant Orders   Ambulatory referral to Psychiatry     Follow up plan: Return in about 3 months (around 02/14/2021) for physical.

## 2020-11-14 NOTE — Assessment & Plan Note (Signed)
Has not seen her psychiatrist since March, unable to get in touch with her. Will call over to see if we can get her back in. If not, new referral placed today.

## 2020-11-14 NOTE — Assessment & Plan Note (Signed)
Under good control on current regimen. Continue current regimen. Continue to monitor. Call with any concerns. Refills given.   

## 2020-12-12 DIAGNOSIS — M818 Other osteoporosis without current pathological fracture: Secondary | ICD-10-CM | POA: Diagnosis not present

## 2020-12-16 ENCOUNTER — Ambulatory Visit: Payer: PPO

## 2021-01-03 ENCOUNTER — Ambulatory Visit (INDEPENDENT_AMBULATORY_CARE_PROVIDER_SITE_OTHER): Payer: PPO

## 2021-01-03 VITALS — Ht <= 58 in | Wt 86.0 lb

## 2021-01-03 DIAGNOSIS — Z0000 Encounter for general adult medical examination without abnormal findings: Secondary | ICD-10-CM

## 2021-01-03 NOTE — Patient Instructions (Signed)
Ms. Stacy Taylor , Thank you for taking time to come for your Medicare Wellness Visit. I appreciate your ongoing commitment to your health goals. Please review the following plan we discussed and let me know if I can assist you in the future.   Screening recommendations/referrals: Colonoscopy: n/a Mammogram: n/a Bone Density: completed 2021 Recommended yearly ophthalmology/optometry visit for glaucoma screening and checkup Recommended yearly dental visit for hygiene and checkup  Vaccinations: Influenza vaccine: due Pneumococcal vaccine: completed 01/26/2020 Tdap vaccine: completed 08/17/2020, due 08/18/2030 Shingles vaccine: n/a  Covid-19: 09/24/2019, 09/03/2019  Advanced directives: Advance directive discussed with you today.   Conditions/risks identified: vapes  Next appointment: Follow up in one year for your annual wellness visit.   Preventive Care 40-64 Years, Female Preventive care refers to lifestyle choices and visits with your health care provider that can promote health and wellness. What does preventive care include? A yearly physical exam. This is also called an annual well check. Dental exams once or twice a year. Routine eye exams. Ask your health care provider how often you should have your eyes checked. Personal lifestyle choices, including: Daily care of your teeth and gums. Regular physical activity. Eating a healthy diet. Avoiding tobacco and drug use. Limiting alcohol use. Practicing safe sex. Taking low-dose aspirin daily starting at age 38. Taking vitamin and mineral supplements as recommended by your health care provider. What happens during an annual well check? The services and screenings done by your health care provider during your annual well check will depend on your age, overall health, lifestyle risk factors, and family history of disease. Counseling  Your health care provider may ask you questions about your: Alcohol use. Tobacco use. Drug  use. Emotional well-being. Home and relationship well-being. Sexual activity. Eating habits. Work and work Statistician. Method of birth control. Menstrual cycle. Pregnancy history. Screening  You may have the following tests or measurements: Height, weight, and BMI. Blood pressure. Lipid and cholesterol levels. These may be checked every 5 years, or more frequently if you are over 39 years old. Skin check. Lung cancer screening. You may have this screening every year starting at age 43 if you have a 30-pack-year history of smoking and currently smoke or have quit within the past 15 years. Fecal occult blood test (FOBT) of the stool. You may have this test every year starting at age 75. Flexible sigmoidoscopy or colonoscopy. You may have a sigmoidoscopy every 5 years or a colonoscopy every 10 years starting at age 61. Hepatitis C blood test. Hepatitis B blood test. Sexually transmitted disease (STD) testing. Diabetes screening. This is done by checking your blood sugar (glucose) after you have not eaten for a while (fasting). You may have this done every 1-3 years. Mammogram. This may be done every 1-2 years. Talk to your health care provider about when you should start having regular mammograms. This may depend on whether you have a family history of breast cancer. BRCA-related cancer screening. This may be done if you have a family history of breast, ovarian, tubal, or peritoneal cancers. Pelvic exam and Pap test. This may be done every 3 years starting at age 57. Starting at age 65, this may be done every 5 years if you have a Pap test in combination with an HPV test. Bone density scan. This is done to screen for osteoporosis. You may have this scan if you are at high risk for osteoporosis. Discuss your test results, treatment options, and if necessary, the need for more tests with  your health care provider. Vaccines  Your health care provider may recommend certain vaccines, such  as: Influenza vaccine. This is recommended every year. Tetanus, diphtheria, and acellular pertussis (Tdap, Td) vaccine. You may need a Td booster every 10 years. Zoster vaccine. You may need this after age 23. Pneumococcal 13-valent conjugate (PCV13) vaccine. You may need this if you have certain conditions and were not previously vaccinated. Pneumococcal polysaccharide (PPSV23) vaccine. You may need one or two doses if you smoke cigarettes or if you have certain conditions. Talk to your health care provider about which screenings and vaccines you need and how often you need them. This information is not intended to replace advice given to you by your health care provider. Make sure you discuss any questions you have with your health care provider. Document Released: 05/10/2015 Document Revised: 01/01/2016 Document Reviewed: 02/12/2015 Elsevier Interactive Patient Education  2017 [place] Prevention in the Home Falls can cause injuries. They can happen to people of all ages. There are many things you can do to make your home safe and to help prevent falls. What can I do on the outside of my home? Regularly fix the edges of walkways and driveways and fix any cracks. Remove anything that might make you trip as you walk through a door, such as a raised step or threshold. Trim any bushes or trees on the path to your home. Use bright outdoor lighting. Clear any walking paths of anything that might make someone trip, such as rocks or tools. Regularly check to see if handrails are loose or broken. Make sure that both sides of any steps have handrails. Any raised decks and porches should have guardrails on the edges. Have any leaves, snow, or ice cleared regularly. Use sand or salt on walking paths during winter. Clean up any spills in your garage right away. This includes oil or grease spills. What can I do in the bathroom? Use night lights. Install grab bars by the toilet and in  the tub and shower. Do not use towel bars as grab bars. Use non-skid mats or decals in the tub or shower. If you need to sit down in the shower, use a plastic, non-slip stool. Keep the floor dry. Clean up any water that spills on the floor as soon as it happens. Remove soap buildup in the tub or shower regularly. Attach bath mats securely with double-sided non-slip rug tape. Do not have throw rugs and other things on the floor that can make you trip. What can I do in the bedroom? Use night lights. Make sure that you have a light by your bed that is easy to reach. Do not use any sheets or blankets that are too big for your bed. They should not hang down onto the floor. Have a firm chair that has side arms. You can use this for support while you get dressed. Do not have throw rugs and other things on the floor that can make you trip. What can I do in the kitchen? Clean up any spills right away. Avoid walking on wet floors. Keep items that you use a lot in easy-to-reach places. If you need to reach something above you, use a strong step stool that has a grab bar. Keep electrical cords out of the way. Do not use floor polish or wax that makes floors slippery. If you must use wax, use non-skid floor wax. Do not have throw rugs and other things on the floor  that can make you trip. What can I do with my stairs? Do not leave any items on the stairs. Make sure that there are handrails on both sides of the stairs and use them. Fix handrails that are broken or loose. Make sure that handrails are as long as the stairways. Check any carpeting to make sure that it is firmly attached to the stairs. Fix any carpet that is loose or worn. Avoid having throw rugs at the top or bottom of the stairs. If you do have throw rugs, attach them to the floor with carpet tape. Make sure that you have a light switch at the top of the stairs and the bottom of the stairs. If you do not have them, ask someone to add them  for you. What else can I do to help prevent falls? Wear shoes that: Do not have high heels. Have rubber bottoms. Are comfortable and fit you well. Are closed at the toe. Do not wear sandals. If you use a stepladder: Make sure that it is fully opened. Do not climb a closed stepladder. Make sure that both sides of the stepladder are locked into place. Ask someone to hold it for you, if possible. Clearly mark and make sure that you can see: Any grab bars or handrails. First and last steps. Where the edge of each step is. Use tools that help you move around (mobility aids) if they are needed. These include: Canes. Walkers. Scooters. Crutches. Turn on the lights when you go into a dark area. Replace any light bulbs as soon as they burn out. Set up your furniture so you have a clear path. Avoid moving your furniture around. If any of your floors are uneven, fix them. If there are any pets around you, be aware of where they are. Review your medicines with your doctor. Some medicines can make you feel dizzy. This can increase your chance of falling. Ask your doctor what other things that you can do to help prevent falls. This information is not intended to replace advice given to you by your health care provider. Make sure you discuss any questions you have with your health care provider. Document Released: 02/07/2009 Document Revised: 09/19/2015 Document Reviewed: 05/18/2014 Elsevier Interactive Patient Education  2017 [employer].

## 2021-01-03 NOTE — Progress Notes (Signed)
I connected with Stacy Taylor today by telephone and verified that I am speaking with the correct person using two identifiers. Location patient: home Location provider: work Persons participating in the virtual visit: Stacy Taylor, Stacy Taylor.   I discussed the limitations, risks, security and privacy concerns of performing an evaluation and management service by telephone and the availability of in person appointments. I also discussed with the patient that there may be a patient responsible charge related to this service. The patient expressed understanding and verbally consented to this telephonic visit.    Interactive audio and video telecommunications were attempted between this provider and patient, however failed, due to patient having technical difficulties OR patient did not have access to video capability.  We continued and completed visit with audio only.     Vital signs may be patient reported or missing.  Subjective:   Stacy Taylor is a 30 y.o. female who presents for an Initial Medicare Annual Wellness Visit.  Review of Systems     Cardiac Risk Factors include: smoking/ tobacco exposure     Objective:    Today's Vitals   01/03/21 1341  Weight: 86 lb (39 kg)  Height: 4\' 7"  (1.397 m)   Body mass index is 19.99 kg/m.  Advanced Directives 01/03/2021 08/21/2020 08/20/2020 08/20/2020  Does Patient Have a Medical Advance Directive? No No Unable to assess, patient is non-responsive or altered mental status;No No  Would patient like information on creating a medical advance directive? - No - Patient declined No - Patient declined -    Current Medications (verified) Outpatient Encounter Medications as of 01/03/2021  Medication Sig   albuterol (VENTOLIN HFA) 108 (90 Base) MCG/ACT inhaler Inhale 2 puffs into the lungs every 6 (six) hours as needed for wheezing or shortness of breath.   budesonide-formoterol (SYMBICORT) 160-4.5 MCG/ACT inhaler INHALE 2 PUFFS  INTO THE LUNGS TWICE DAILY   busPIRone (BUSPAR) 15 MG tablet Take 15 mg by mouth daily.   butalbital-acetaminophen-caffeine (FIORICET) 50-325-40 MG tablet Take 1-2 tablets by mouth every 6 (six) hours as needed for headache.   gabapentin (NEURONTIN) 400 MG capsule Take 1 capsule (400 mg total) by mouth 3 (three) times daily.   hydrOXYzine (ATARAX/VISTARIL) 50 MG tablet TAKE 1 TABLET(50 MG) BY MOUTH DAILY AT BEDTIME   levonorgestrel (LILETTA, 52 MG,) 19.5 MCG/DAY IUD IUD 1 each by Intrauterine route once.   nortriptyline (PAMELOR) 10 MG capsule Take 10 mg nightly for a week, then increase to 20 mg nightly.   QUEtiapine (SEROQUEL) 50 MG tablet Take 1 tablet by mouth daily.   Vitamin D, Ergocalciferol, (DRISDOL) 1.25 MG (50000 UNIT) CAPS capsule Take 50,000 Units by mouth once a week.   zoledronic acid (RECLAST) 5 MG/100ML SOLN injection Inject 5 mg into the vein once. Once yearly   rizatriptan (MAXALT) 10 MG tablet Take 1 tablet (10 mg total) by mouth as needed for migraine. May repeat in 2 hours if needed (Patient not taking: No sig reported)   Teriparatide, Recombinant, 620 MCG/2.48ML SOPN Inject into the skin daily. Injections (Patient not taking: Reported on 01/03/2021)   No facility-administered encounter medications on file as of 01/03/2021.    Allergies (verified) Ibuprofen   History: Past Medical History:  Diagnosis Date   Anxiety    Asthma    Depression    Fetal alcohol syndrome    Migraines    Osteoporosis    Scoliosis    Seizures (HCC)    Past Surgical History:  Procedure Laterality Date  FRACTURE SURGERY     HAND SURGERY     TONSILECTOMY, ADENOIDECTOMY, BILATERAL MYRINGOTOMY AND TUBES     WRIST SURGERY     Family History  Problem Relation Age of Onset   Drug abuse Other    Mental illness Other    Social History   Socioeconomic History   Marital status: Married    Spouse name: Not on file   Number of children: Not on file   Years of education: Not on file    Highest education level: Not on file  Occupational History   Not on file  Tobacco Use   Smoking status: Former    Types: Cigarettes   Smokeless tobacco: Never   Tobacco comments:    1 cig per day reported 03/14/2020  Vaping Use   Vaping Use: Every day  Substance and Sexual Activity   Alcohol use: Yes    Comment: socially   Drug use: Never   Sexual activity: Yes  Other Topics Concern   Not on file  Social History Narrative   ** Merged History Encounter **       Social Determinants of Health   Financial Resource Strain: Low Risk    Difficulty of Paying Living Expenses: Not hard at all  Food Insecurity: No Food Insecurity   Worried About Programme researcher, broadcasting/film/videounning Out of Food in the Last Year: Never true   Ran Out of Food in the Last Year: Never true  Transportation Needs: No Transportation Needs   Lack of Transportation (Medical): No   Lack of Transportation (Non-Medical): No  Physical Activity: Sufficiently Active   Days of Exercise per Week: 7 days   Minutes of Exercise per Session: 90 min  Stress: Stress Concern Present   Feeling of Stress : To some extent  Social Connections: Not on file    Tobacco Counseling Counseling given: Not Answered Tobacco comments: 1 cig per day reported 03/14/2020   Clinical Intake:  Pre-visit preparation completed: Yes  Pain : No/denies pain     Nutritional Status: BMI of 19-24  Normal Nutritional Risks: None Diabetes: No  How often do you need to have someone help you when you read instructions, pamphlets, or other written materials from your doctor or pharmacy?: 1 - Never What is the last grade level you completed in school?: 12th grade  Diabetic? no  Interpreter Needed?: No  Information entered by :: Stacy Taylor   Activities of Daily Living In your present state of health, do you have any difficulty performing the following activities: 01/03/2021  Hearing? N  Vision? N  Difficulty concentrating or making decisions? N  Walking or  climbing stairs? N  Dressing or bathing? N  Doing errands, shopping? N  Preparing Food and eating ? N  Using the Toilet? N  In the past six months, have you accidently leaked urine? N  Do you have problems with loss of bowel control? N  Managing your Medications? N  Managing your Finances? N  Housekeeping or managing your Housekeeping? N  Some recent data might be hidden    Patient Care Team: Dorcas CarrowJohnson, Megan P, DO as PCP - General (Family Medicine) Particia NearingLane, Rachel Elizabeth, PA-C (Family Medicine)  Indicate any recent Medical Services you may have received from other than [facility] providers in the past year (date may be approximate).     Assessment:   This is a routine wellness examination for Stacy Taylor.  Hearing/Vision screen Vision Screening - Comments:: [facility]  Dietary issues and  exercise activities discussed: Current Exercise Habits: Home exercise routine, Type of exercise: walking, Time (Minutes): > 60, Frequency (Times/Week): 7, Weekly Exercise (Minutes/Week): 0   Goals Addressed             This Visit's Progress    Patient Stated       01/03/2021, no goals       Depression Screen PHQ 2/9 Scores 01/03/2021 11/14/2020 02/16/2020 10/12/2019  PHQ - 2 Score 1 0 0 1  PHQ- 9 Score - 9 0 15    Fall Risk Fall Risk  01/03/2021 02/16/2020 10/12/2019  Falls in the past year? 0 0 0  Number falls in past yr: - 0 0  Injury with Fall? - 0 0  Risk for fall due to : Medication side effect No Fall Risks -  Follow up Falls evaluation completed;Education provided;Falls prevention discussed Falls evaluation completed -    FALL RISK PREVENTION PERTAINING TO THE HOME:  Any stairs in or around the home? Yes  If so, are there any without handrails? No  Home free of loose throw rugs in walkways, pet beds, electrical cords, etc? Yes  Adequate lighting in your home to reduce risk of falls? Yes   ASSISTIVE DEVICES UTILIZED TO PREVENT FALLS:  Life alert? No   Use of a cane, walker or w/c? No  Grab bars in the bathroom? No  Shower chair or bench in shower? No  Elevated toilet seat or a handicapped toilet? No   TIMED UP AND GO:  Was the test performed? No .      Cognitive Function:     6CIT Screen 01/03/2021  What Year? 0 points  What month? 0 points  What time? 0 points  Count back from 20 0 points  Months in reverse 0 points  Repeat phrase 4 points  Total Score 4    Immunizations Immunization History  Administered Date(s) Administered   Influenza-Unspecified 02/14/2020   PFIZER(Purple Top)SARS-COV-2 Vaccination 09/03/2019, 09/24/2019   Pneumococcal Polysaccharide-23 01/26/2020   Tdap 08/17/2020    TDAP status: Up to date  Flu Vaccine status: Due, Education has been provided regarding the importance of this vaccine. Advised may receive this vaccine at local pharmacy or Health Dept. Aware to provide a copy of the vaccination record if obtained from local pharmacy or Health Dept. Verbalized acceptance and understanding.  Pneumococcal vaccine status: Up to date  Covid-19 vaccine status: Completed vaccines  Qualifies for Shingles Vaccine? No   Zostavax completed No   Shingrix Completed?: n/a  Screening Tests Health Maintenance  Topic Date Due   PAP SMEAR-Modifier  Never done   COVID-19 Vaccine (3 - Pfizer risk series) 10/22/2019   INFLUENZA VACCINE  11/25/2020   Pneumococcal Vaccine 13-59 Years old (2 - PCV) 01/25/2021   Hepatitis C Screening  11/14/2021 (Originally 04/27/2007)   HIV Screening  11/14/2021 (Originally 04/26/2004)   TETANUS/TDAP  08/18/2030   HPV VACCINES  Aged Out    Health Maintenance  Health Maintenance Due  Topic Date Due   PAP SMEAR-Modifier  Never done   COVID-19 Vaccine (3 - Pfizer risk series) 10/22/2019   INFLUENZA VACCINE  11/25/2020   Pneumococcal Vaccine 24-59 Years old (2 - PCV) 01/25/2021    Colorectal cancer screening: n/a   Mammogram status: n/a  Bone Density status:  completed 2021  Lung Cancer Screening: (Low Dose CT Chest recommended if Age 91-80 years, 30 pack-year currently smoking OR have quit w/in 15years.) does not qualify.   Lung Cancer Screening Referral:  no  Additional Screening:  Hepatitis C Screening: does qualify;  Vision Screening: Recommended annual ophthalmology exams for early detection of glaucoma and other disorders of the eye. Is the patient up to date with their annual eye exam?  Yes  Who is the provider or what is the name of the office in which the patient attends annual eye exams? [facility] If pt is not established with a provider, would they like to be referred to a provider to establish care? No .   Dental Screening: Recommended annual dental exams for proper oral hygiene  Community Resource Referral / Chronic Care Management: CRR required this visit?  No   CCM required this visit?  No      Plan:     I have personally reviewed and noted the following in the patient's chart:   Medical and social history Use of alcohol, tobacco or illicit drugs  Current medications and supplements including opioid prescriptions. Patient is not currently taking opioid prescriptions. Functional ability and status Nutritional status Physical activity Advanced directives List of other physicians Hospitalizations, surgeries, and ER visits in previous 12 months Vitals Screenings to include cognitive, depression, and falls Referrals and appointments  In addition, I have reviewed and discussed with patient certain preventive protocols, quality metrics, and best practice recommendations. A written personalized care plan for preventive services as well as general preventive health recommendations were provided to patient.     Stacy Merino, Taylor   09/02/1636   Nurse Notes:

## 2021-01-16 ENCOUNTER — Other Ambulatory Visit: Payer: Self-pay | Admitting: Psychiatry

## 2021-01-16 DIAGNOSIS — F250 Schizoaffective disorder, bipolar type: Secondary | ICD-10-CM

## 2021-02-14 ENCOUNTER — Other Ambulatory Visit: Payer: Self-pay

## 2021-02-14 ENCOUNTER — Ambulatory Visit (INDEPENDENT_AMBULATORY_CARE_PROVIDER_SITE_OTHER): Payer: PPO | Admitting: Family Medicine

## 2021-02-14 ENCOUNTER — Encounter: Payer: Self-pay | Admitting: Family Medicine

## 2021-02-14 VITALS — BP 90/62 | HR 109 | Temp 98.4°F | Ht <= 58 in | Wt 92.4 lb

## 2021-02-14 DIAGNOSIS — Z136 Encounter for screening for cardiovascular disorders: Secondary | ICD-10-CM

## 2021-02-14 DIAGNOSIS — Z1159 Encounter for screening for other viral diseases: Secondary | ICD-10-CM | POA: Diagnosis not present

## 2021-02-14 DIAGNOSIS — G43009 Migraine without aura, not intractable, without status migrainosus: Secondary | ICD-10-CM | POA: Diagnosis not present

## 2021-02-14 DIAGNOSIS — J4540 Moderate persistent asthma, uncomplicated: Secondary | ICD-10-CM

## 2021-02-14 DIAGNOSIS — F250 Schizoaffective disorder, bipolar type: Secondary | ICD-10-CM

## 2021-02-14 DIAGNOSIS — R42 Dizziness and giddiness: Secondary | ICD-10-CM

## 2021-02-14 DIAGNOSIS — Z23 Encounter for immunization: Secondary | ICD-10-CM

## 2021-02-14 LAB — BAYER DCA HB A1C WAIVED: HB A1C (BAYER DCA - WAIVED): 3.9 % — ABNORMAL LOW (ref 4.8–5.6)

## 2021-02-14 LAB — URINALYSIS, ROUTINE W REFLEX MICROSCOPIC
Bilirubin, UA: NEGATIVE
Glucose, UA: NEGATIVE
Leukocytes,UA: NEGATIVE
Nitrite, UA: NEGATIVE
Protein,UA: NEGATIVE
RBC, UA: NEGATIVE
Specific Gravity, UA: >1.03 — ABNORMAL HIGH (ref 1.005–1.030)
Urobilinogen, Ur: 1 mg/dL (ref 0.2–1.0)
pH, UA: 5.5 (ref 5.0–7.5)

## 2021-02-14 MED ORDER — BUDESONIDE-FORMOTEROL FUMARATE 160-4.5 MCG/ACT IN AERO: 2.0000 | INHALATION_SPRAY | Freq: Two times a day (BID) | RESPIRATORY_TRACT | 12 refills | Status: DC

## 2021-02-14 MED ORDER — ALBUTEROL SULFATE HFA 108 (90 BASE) MCG/ACT IN AERS: 2.0000 | INHALATION_SPRAY | Freq: Four times a day (QID) | RESPIRATORY_TRACT | 11 refills | Status: DC | PRN

## 2021-02-14 NOTE — Progress Notes (Signed)
BP 90/62   Pulse (!) 109   Temp 98.4 F (36.9 C) (Oral)   Ht 4\' 7"  (1.397 m)   Wt 92 lb 6.4 oz (41.9 kg)   SpO2 99%   BMI 21.48 kg/m    Subjective:    Patient ID: Stacy Taylor, female    DOB: 1991/04/04, 30 y.o.   MRN: [id number]  HPI: Stacy Taylor is a 30 y.o. female  Chief Complaint  Patient presents with   Hypertension    Has been feeling dizzy, nausea, and having headaches for the past week or so   DIZZINESS/BP going up and down Duration: 3+ months Description of symptoms: dizziness and seeing sparks every time she gets up Duration of episode: 5-10 minutes Dizziness frequency: recurrent Provoking factors: sitting to standing, waking up, laying to sitting Triggered by rolling over in bed: yes- but feels different Triggered by bending over: yes Aggravated by head movement: yes- but feels more laggy than dizzy Aggravated by exertion, coughing, loud noises: no Recent head injury: no Recent or current viral symptoms: no History of vasovagal episodes: yes- closer to 3 months ago Nausea: yes Vomiting: no Tinnitus: yes Hearing loss: no Aural fullness: yes Headache: yes- no changes Photophobia/phonophobia: yes Unsteady gait: no Postural instability: yes Diplopia, dysarthria, dysphagia or weakness: no Related to exertion: yes Pallor: no Diaphoresis: yes Dyspnea: no Chest pain: no  Relevant past medical, surgical, family and social history reviewed and updated as indicated. Interim medical history since our last visit reviewed. Allergies and medications reviewed and updated.  Review of Systems  Constitutional: Negative.   HENT: Negative.    Respiratory: Negative.  Negative for apnea, cough, choking, chest tightness, shortness of breath, wheezing and stridor.   Cardiovascular:  Positive for palpitations. Negative for chest pain and leg swelling.  Neurological:  Positive for dizziness, syncope, light-headedness and headaches. Negative for tremors,  seizures, facial asymmetry, speech difficulty, weakness and numbness.  Psychiatric/Behavioral: Negative.     Per HPI unless specifically indicated above     Objective:    BP 90/62   Pulse (!) 109   Temp 98.4 F (36.9 C) (Oral)   Ht 4\' 7"  (1.397 m)   Wt 92 lb 6.4 oz (41.9 kg)   SpO2 99%   BMI 21.48 kg/m   Wt Readings from Last 3 Encounters:  02/14/21 92 lb 6.4 oz (41.9 kg)  01/03/21 86 lb (39 kg)  11/14/20 94 lb 9.6 oz (42.9 kg)    Physical Exam Vitals and nursing note reviewed.  Constitutional:      General: She is not in acute distress.    Appearance: Normal appearance. She is not ill-appearing, toxic-appearing or diaphoretic.  HENT:     Head: Normocephalic and atraumatic.     Right Ear: External ear normal.     Left Ear: External ear normal.     Nose: Nose normal.     Mouth/Throat:     Mouth: Mucous membranes are moist.     Pharynx: Oropharynx is clear.  Eyes:     General: No scleral icterus.       Right eye: No discharge.        Left eye: No discharge.     Extraocular Movements: Extraocular movements intact.     Conjunctiva/sclera: Conjunctivae normal.     Pupils: Pupils are equal, round, and reactive to light.  Cardiovascular:     Rate and Rhythm: Normal rate and regular rhythm.     Pulses: Normal pulses.  Heart sounds: Normal heart sounds. No murmur heard.   No friction rub. No gallop.  Pulmonary:     Effort: Pulmonary effort is normal. No respiratory distress.     Breath sounds: Normal breath sounds. No stridor. No wheezing, rhonchi or rales.  Chest:     Chest wall: No tenderness.  Musculoskeletal:        General: Normal range of motion.     Cervical back: Normal range of motion and neck supple.  Skin:    General: Skin is warm and dry.     Capillary Refill: Capillary refill takes less than 2 seconds.     Coloration: Skin is not jaundiced or pale.     Findings: No bruising, erythema, lesion or rash.  Neurological:     General: No focal deficit  present.     Mental Status: She is alert and oriented to person, place, and time. Mental status is at baseline.  Psychiatric:        Mood and Affect: Mood normal.        Behavior: Behavior normal.        Thought Content: Thought content normal.        Judgment: Judgment normal.    Results for orders placed or performed in visit on 02/14/21  Bayer DCA Hb A1c Waived  Result Value Ref Range   HB A1C (BAYER DCA - WAIVED) 3.9 (L) 4.8 - 5.6 %  Urinalysis, Routine w reflex microscopic  Result Value Ref Range   Specific Gravity, UA >1.030 (H) 1.005 - 1.030   pH, UA 5.5 5.0 - 7.5   Color, UA Yellow Yellow   Appearance Ur Clear Clear   Leukocytes,UA Negative Negative   Protein,UA Negative Negative/Trace   Glucose, UA Negative Negative   Ketones, UA Trace (A) Negative   RBC, UA Negative Negative   Bilirubin, UA Negative Negative   Urobilinogen, Ur 1.0 0.2 - 1.0 mg/dL   Nitrite, UA Negative Negative      Assessment & Plan:   Problem List Items Addressed This Visit       Cardiovascular and Mediastinum   Migraines    Follows with neurology. Continue to monitor.       Relevant Medications   SUMAtriptan (IMITREX) 100 MG tablet     Respiratory   Asthma    Under good control on current regimen. Continue current regimen. Continue to monitor. Call with any concerns. Refills given.        Relevant Medications   albuterol (VENTOLIN HFA) 108 (90 Base) MCG/ACT inhaler   budesonide-formoterol (SYMBICORT) 160-4.5 MCG/ACT inhaler     Other   Schizoaffective disorder, bipolar type (HCC)    Follows with psychiatry. Continue to monitor.       Dizziness - Primary    Of unclear etiology. Concern for POTS. Not orthostatic today. EKG normal. May have a bit of a vertiginous component. Will start epley's maneuver and check labs. Refer to cards/POTS clinic. Call with any concerns. Discussed hydration and eating regularly. Call with any concerns.       Relevant Orders   EKG 12-Lead  (Completed)   Bayer DCA Hb A1c Waived (Completed)   CBC with Differential/Platelet   Comprehensive metabolic panel   TSH   UA/M w/rflx Culture, Routine   VITAMIN D 25 Hydroxy (Vit-D Deficiency, Fractures)   Prolactin   HIV Antibody (routine testing w rflx)   Magnesium   Phosphorus   Urinalysis, Routine w reflex microscopic   Urine Culture   Ambulatory  referral to Cardiology   Other Visit Diagnoses     Need for hepatitis C screening test       Labs drawn today. Await results. Treat as needed.    Relevant Orders   Hepatitis C Antibody   Screening for heart disease       Labs drawn today. Await results. Treat as needed.    Relevant Orders   Lipid Panel w/o Chol/HDL Ratio   Need for influenza vaccination       Flu shot given today.   Relevant Orders   Flu Vaccine QUAD 6+ mos PF IM (Fluarix Quad PF) (Completed)        Follow up plan: Return in about 4 weeks (around 03/14/2021), or physical with pap (no labs so OK to be in the PM).   >30 minutes spent with patient today

## 2021-02-14 NOTE — Assessment & Plan Note (Signed)
Under good control on current regimen. Continue current regimen. Continue to monitor. Call with any concerns. Refills given.   

## 2021-02-14 NOTE — Assessment & Plan Note (Signed)
Follows with psychiatry. Continue to monitor.

## 2021-02-14 NOTE — Assessment & Plan Note (Signed)
Follows with neurology. Continue to monitor.

## 2021-02-14 NOTE — Assessment & Plan Note (Signed)
Of unclear etiology. Concern for POTS. Not orthostatic today. EKG normal. May have a bit of a vertiginous component. Will start epley's maneuver and check labs. Refer to cards/POTS clinic. Call with any concerns. Discussed hydration and eating regularly. Call with any concerns.

## 2021-02-15 LAB — CBC WITH DIFFERENTIAL/PLATELET
Basophils Absolute: 0 10*3/uL (ref 0.0–0.2)
Basos: 0 %
EOS (ABSOLUTE): 0 10*3/uL (ref 0.0–0.4)
Eos: 0 %
Hematocrit: 41.5 % (ref 34.0–46.6)
Hemoglobin: 14.3 g/dL (ref 11.1–15.9)
Immature Grans (Abs): 0 10*3/uL (ref 0.0–0.1)
Immature Granulocytes: 0 %
Lymphocytes Absolute: 2.2 10*3/uL (ref 0.7–3.1)
Lymphs: 29 %
MCH: 32.9 pg (ref 26.6–33.0)
MCHC: 34.5 g/dL (ref 31.5–35.7)
MCV: 96 fL (ref 79–97)
Monocytes Absolute: 0.4 10*3/uL (ref 0.1–0.9)
Monocytes: 6 %
Neutrophils Absolute: 5 10*3/uL (ref 1.4–7.0)
Neutrophils: 65 %
Platelets: 151 10*3/uL (ref 150–450)
RBC: 4.34 x10E6/uL (ref 3.77–5.28)
RDW: 12.7 % (ref 11.7–15.4)
WBC: 7.8 10*3/uL (ref 3.4–10.8)

## 2021-02-15 LAB — LIPID PANEL W/O CHOL/HDL RATIO
Cholesterol, Total: 145 mg/dL (ref 100–199)
HDL: 60 mg/dL (ref >39)
LDL Chol Calc (NIH): 77 mg/dL (ref 0–99)
Triglycerides: 31 mg/dL (ref 0–149)
VLDL Cholesterol Cal: 8 mg/dL (ref 5–40)

## 2021-02-15 LAB — COMPREHENSIVE METABOLIC PANEL
ALT: 9 IU/L (ref 0–32)
AST: 13 IU/L (ref 0–40)
Albumin/Globulin Ratio: 2.2 (ref 1.2–2.2)
Albumin: 4.7 g/dL (ref 3.8–4.8)
Alkaline Phosphatase: 45 IU/L (ref 44–121)
BUN/Creatinine Ratio: 27 — ABNORMAL HIGH (ref 9–23)
BUN: 17 mg/dL (ref 6–20)
Bilirubin Total: 1.1 mg/dL (ref 0.0–1.2)
CO2: 22 mmol/L (ref 20–29)
Calcium: 9 mg/dL (ref 8.7–10.2)
Chloride: 104 mmol/L (ref 96–106)
Creatinine, Ser: 0.62 mg/dL (ref 0.57–1.00)
Globulin, Total: 2.1 g/dL (ref 1.5–4.5)
Glucose: 77 mg/dL (ref 70–99)
Potassium: 3.7 mmol/L (ref 3.5–5.2)
Sodium: 140 mmol/L (ref 134–144)
Total Protein: 6.8 g/dL (ref 6.0–8.5)
eGFR: 122 mL/min/{1.73_m2} (ref >59)

## 2021-02-15 LAB — VITAMIN D 25 HYDROXY (VIT D DEFICIENCY, FRACTURES): Vit D, 25-Hydroxy: 18 ng/mL — ABNORMAL LOW (ref 30.0–100.0)

## 2021-02-15 LAB — HEPATITIS C ANTIBODY: Hep C Virus Ab: <0.1 s/co ratio (ref 0.0–0.9)

## 2021-02-15 LAB — MAGNESIUM: Magnesium: 2 mg/dL (ref 1.6–2.3)

## 2021-02-15 LAB — PROLACTIN: Prolactin: 5.8 ng/mL (ref 4.8–23.3)

## 2021-02-15 LAB — HIV ANTIBODY (ROUTINE TESTING W REFLEX): HIV Screen 4th Generation wRfx: NONREACTIVE

## 2021-02-15 LAB — TSH: TSH: 0.709 u[IU]/mL (ref 0.450–4.500)

## 2021-02-15 LAB — PHOSPHORUS: Phosphorus: 2.2 mg/dL — ABNORMAL LOW (ref 3.0–4.3)

## 2021-02-17 ENCOUNTER — Ambulatory Visit: Payer: Self-pay | Admitting: *Deleted

## 2021-02-17 LAB — URINE CULTURE

## 2021-02-17 NOTE — Telephone Encounter (Signed)
Pt saw Dr. Laural Benes 02/14/21 for vertigo, possible POTS. Pt states did Epleys maneuver Friday evening "And has been worse." States worse sitting to standing, occurring more frequently. States "As soon as occurs, I sit down for a while." Pt states she is waiting for call from cardiologist regarding POTS, is questioning if there is anything Dr. Laural Benes can call in or recommend in meantime.Assured pt NT would route to practice for PCPs review and final disposition.  Advised UC/ED for worsening symptoms.  Pt verbalizes understanding.   Please advise: [phone].

## 2021-02-17 NOTE — Telephone Encounter (Signed)
Reason for Disposition  [1] MODERATE dizziness (e.g., vertigo; feels very unsteady, interferes with normal activities) AND [2] has NOT been evaluated by physician for this  Answer Assessment - Initial Assessment Questions 1. DESCRIPTION: "Describe your dizziness."     spinning 2. VERTIGO: "Do you feel like either you or the room is spinning or tilting?"      Spinning and tilting 3. LIGHTHEADED: "Do you feel lightheaded?" (e.g., somewhat faint, woozy, weak upon standing)     yes 4. SEVERITY: "How bad is it?"  "Can you walk?"   - MILD: Feels slightly dizzy and unsteady, but is walking normally.   - MODERATE: Feels unsteady when walking, but not falling; interferes with normal activities (e.g., school, work).   - SEVERE: Unable to walk without falling, or requires assistance to walk without falling.     I sit down when occurs 5. ONSET:  "When did the dizziness begin?"     Seen Friday. Onset  6. AGGRAVATING FACTORS: "Does anything make it worse?" (e.g., standing, change in head position)     Sitting to standing 7. CAUSE: "What do you think is causing the dizziness?"      8. RECURRENT SYMPTOM: "Have you had dizziness before?" If Yes, ask: "When was the last time?" "What happened that time?"     Seen Friday 9. OTHER SYMPTOMS: "Do you have any other symptoms?" (e.g., headache, weakness, numbness, vomiting, earache)     Headache "But not unusual. "  Protocols used: Dizziness - Vertigo-A-AH

## 2021-02-18 ENCOUNTER — Other Ambulatory Visit: Payer: Self-pay | Admitting: Family Medicine

## 2021-02-18 MED ORDER — VITAMIN D (ERGOCALCIFEROL) 1.25 MG (50000 UNIT) PO CAPS: 50000.0000 [IU] | ORAL_CAPSULE | ORAL | 1 refills | Status: DC

## 2021-02-18 NOTE — Telephone Encounter (Signed)
Routing to provider to advise.  

## 2021-02-18 NOTE — Telephone Encounter (Signed)
Please see below and advise.

## 2021-02-22 MED ORDER — MECLIZINE HCL 25 MG PO TABS: 25.0000 mg | ORAL_TABLET | Freq: Three times a day (TID) | ORAL | 0 refills | Status: DC | PRN

## 2021-02-22 NOTE — Addendum Note (Signed)
Addended by: Dorcas Carrow on: 02/22/2021 05:16 PM   Modules accepted: Orders

## 2021-02-22 NOTE — Progress Notes (Signed)
Interpreted by me on 02/14/21 by me. NSR at 87bpm, No ST segment changes.

## 2021-03-11 NOTE — Progress Notes (Signed)
Electrophysiology Office Note:    Date:  03/12/2021   ID:  Stacy Taylor, DOB Jun 26, 1990, MRN [id number]  PCP:  Dorcas Carrow, DO  [facility] Cardiologist:  None  [facility] Electrophysiologist:  Lanier Prude, MD   Referring MD: Dorcas Carrow, DO   Chief Complaint: Dizziness  History of Present Illness:    Stacy Taylor is a 30 y.o. female who presents for an evaluation of dizziness at the request of Dr. Laural Benes. Their medical history includes hypertension.  The patient was seen by Dr. Laural Benes October 21.  At that appointment she reported 3 months of dizziness spells.  Episodes last 5 to 10 minutes at a time.  Changes in position seem to trigger the episodes.  She tells me that for the last few months she has had trouble with dizziness.  Episodes are prompted by changes in position.  She tells me that it feels like the room is spinning.  She also describes a sensation that her eyes are slow to catch up with head motions.  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    Past Medical History:  Diagnosis Date   Anxiety    Asthma    Depression    Fetal alcohol syndrome    Migraines    Osteoporosis    Scoliosis    Seizures (HCC)     Past Surgical History:  Procedure Laterality Date   FRACTURE SURGERY     HAND SURGERY     TONSILECTOMY, ADENOIDECTOMY, BILATERAL MYRINGOTOMY AND TUBES     WRIST SURGERY      Current Medications: Current Meds  Medication Sig   albuterol (VENTOLIN HFA) 108 (90 Base) MCG/ACT inhaler Inhale 2 puffs into the lungs every 6 (six) hours as needed for wheezing or shortness of breath.   budesonide-formoterol (SYMBICORT) 160-4.5 MCG/ACT inhaler Inhale 2 puffs into the lungs 2 (two) times daily.   busPIRone (BUSPAR) 15 MG tablet Take 15 mg by mouth daily.   gabapentin (NEURONTIN) 400 MG capsule Take 1 capsule (400 mg total) by mouth 3 (three) times daily.   hydrOXYzine (ATARAX/VISTARIL) 50 MG tablet TAKE 1 TABLET(50  MG) BY MOUTH DAILY AT BEDTIME   levonorgestrel (LILETTA, 52 MG,) 19.5 MCG/DAY IUD IUD 1 each by Intrauterine route once.   nortriptyline (PAMELOR) 10 MG capsule Take 10 mg nightly for a week, then increase to 20 mg nightly.   QUEtiapine (SEROQUEL) 50 MG tablet Take 1 tablet by mouth daily.   SUMAtriptan (IMITREX) 100 MG tablet Take 100 mg by mouth as directed.   Vitamin D, Ergocalciferol, (DRISDOL) 1.25 MG (50000 UNIT) CAPS capsule Take 1 capsule (50,000 Units total) by mouth once a week.   zoledronic acid (RECLAST) 5 MG/100ML SOLN injection Inject 5 mg into the vein once. Once yearly     Allergies:   Ibuprofen   Social History   Socioeconomic History   Marital status: Married    Spouse name: Not on file   Number of children: Not on file   Years of education: Not on file   Highest education level: Not on file  Occupational History   Not on file  Tobacco Use   Smoking status: Former    Types: Cigarettes   Smokeless tobacco: Never   Tobacco comments:    1 cig per day reported 03/14/2020  Vaping Use   Vaping Use: Every day  Substance and Sexual Activity   Alcohol use: Yes    Comment: socially  Drug use: Never   Sexual activity: Yes  Other Topics Concern   Not on file  Social History Narrative   ** Merged History Encounter **       Social Determinants of Health   Financial Resource Strain: Low Risk    Difficulty of Paying Living Expenses: Not hard at all  Food Insecurity: No Food Insecurity   Worried About Programme researcher, broadcasting/film/video in the Last Year: Never true   Barista in the Last Year: Never true  Transportation Needs: No Transportation Needs   Lack of Transportation (Medical): No   Lack of Transportation (Non-Medical): No  Physical Activity: Sufficiently Active   Days of Exercise per Week: 7 days   Minutes of Exercise per Session: 90 min  Stress: Stress Concern Present   Feeling of Stress : To some extent  Social Connections: Not on file     Family  History: The patient's family history includes Drug abuse in an other family member; Mental illness in an other family member.  ROS:   Please see the history of present illness.    All other systems reviewed and are negative.  EKGs/Labs/Other Studies Reviewed:    The following studies were reviewed today:  February 14, 2021 EKG Sinus rhythm, incomplete right bundle branch block  EKG:  The ekg ordered today demonstrates sinus rhythm.  Incomplete right bundle branch block murmur   Recent Labs: 02/14/2021: ALT 9; BUN 17; Creatinine, Ser 0.62; Hemoglobin 14.3; Magnesium 2.0; Platelets 151; Potassium 3.7; Sodium 140; TSH 0.709  Recent Lipid Panel    Component Value Date/Time   CHOL 145 02/14/2021 1128   TRIG 31 02/14/2021 1128   HDL 60 02/14/2021 1128   LDLCALC 77 02/14/2021 1128    Physical Exam:    VS:  BP 113/70 (BP Location: Right Arm, Patient Position: Sitting, Cuff Size: Normal)   Pulse 92   Ht 4\' 7"  (1.397 m)   Wt 95 lb 6.4 oz (43.3 kg)   SpO2 99%   BMI 22.17 kg/m      Orthostatic vitals Flat 92, 106/70 Sitting 100, 105/70 Standing 99, 102/69 Standing 3 minutes 97, 105/68   Wt Readings from Last 3 Encounters:  03/12/21 95 lb 6.4 oz (43.3 kg)  02/14/21 92 lb 6.4 oz (41.9 kg)  01/03/21 86 lb (39 kg)     GEN:  Well nourished, well developed in no acute distress HEENT: Normal NECK: No JVD; No carotid bruits LYMPHATICS: No lymphadenopathy CARDIAC: RRR, no murmurs, rubs, gallops RESPIRATORY:  Clear to auscultation without rales, wheezing or rhonchi  ABDOMEN: Soft, non-tender, non-distended MUSCULOSKELETAL:  No edema; No deformity  SKIN: Warm and dry NEUROLOGIC:  Alert and oriented x 3 PSYCHIATRIC:  Normal affect       ASSESSMENT:    1. Anxiety disorder, unspecified type   2. Dizziness   3. Murmur    PLAN:    In order of problems listed above:  #Dizziness From her story it seems like this is most consistent with an inner ear process/vertigo.  I  have a low suspicion that this is heart rhythm related.  Given her history of heart murmur, would like to get an echocardiogram and a 1 week ZIO monitor.  She should stay hydrated.  #History of murmur Echo as above  Follow-up based on results of above.   Medication Adjustments/Labs and Tests Ordered: Current medicines are reviewed at length with the patient today.  Concerns regarding medicines are outlined above.  Orders Placed  This Encounter  Procedures   LONG TERM MONITOR (3-14 DAYS)   EKG 12-Lead   ECHOCARDIOGRAM COMPLETE    No orders of the defined types were placed in this encounter.    Signed, Rossie Muskrat. Lalla Brothers, MD, [facility], [facility] 03/12/2021 10:46 AM    Electrophysiology [facility]

## 2021-03-12 ENCOUNTER — Ambulatory Visit (INDEPENDENT_AMBULATORY_CARE_PROVIDER_SITE_OTHER): Payer: PPO

## 2021-03-12 ENCOUNTER — Encounter: Payer: Self-pay | Admitting: Cardiology

## 2021-03-12 ENCOUNTER — Other Ambulatory Visit: Payer: Self-pay

## 2021-03-12 ENCOUNTER — Ambulatory Visit: Payer: PPO | Admitting: Cardiology

## 2021-03-12 VITALS — BP 113/70 | HR 92 | Ht <= 58 in | Wt 95.4 lb

## 2021-03-12 DIAGNOSIS — F419 Anxiety disorder, unspecified: Secondary | ICD-10-CM

## 2021-03-12 DIAGNOSIS — R011 Cardiac murmur, unspecified: Secondary | ICD-10-CM

## 2021-03-12 DIAGNOSIS — R42 Dizziness and giddiness: Secondary | ICD-10-CM

## 2021-03-12 NOTE — Patient Instructions (Addendum)
Medication Instructions:  Your physician recommends that you continue on your current medications as directed. Please refer to the Current Medication list given to you today. *If you need a refill on your cardiac medications before your next appointment, please call your pharmacy*  Lab Work: None ordered. If you have labs (blood work) drawn today and your tests are completely normal, you will receive your results only by: MyChart Message (if you have MyChart) OR A paper copy in the mail If you have any lab test that is abnormal or we need to change your treatment, we will call you to review the results.  Testing/Procedures: Your physician has recommended that you wear a holter monitor. Holter monitors are medical devices that record the heart's electrical activity. Doctors most often use these monitors to diagnose arrhythmias. Arrhythmias are problems with the speed or rhythm of the heartbeat. The monitor is a small, portable device. You can wear one while you do your normal daily activities. This is usually used to diagnose what is causing palpitations/syncope (passing out).  You will wear a 7 day ZIO monitor  Your physician has requested that you have an echocardiogram. Echocardiography is a painless test that uses sound waves to create images of your heart. It provides your doctor with information about the size and shape of your heart and how well your heart's chambers and valves are working. This procedure takes approximately one hour. There are no restrictions for this procedure.  Please schedule for ECHO  Follow-Up: At [facility], you and your health needs are our priority.  As part of our continuing mission to provide you with exceptional heart care, we have created designated Provider Care Teams.  These Care Teams include your primary Cardiologist (physician) and Advanced Practice Providers (APPs -  Physician Assistants and Nurse Practitioners) who all work together to provide you  with the care you need, when you need it.  Your next appointment:   Your physician wants you to follow-up based on results of testing.  Your physician has recommended that you wear a Zio monitor.   This monitor is a medical device that records the heart's electrical activity. Doctors most often use these monitors to diagnose arrhythmias. Arrhythmias are problems with the speed or rhythm of the heartbeat. The monitor is a small device applied to your chest. You can wear one while you do your normal daily activities. While wearing this monitor if you have any symptoms to push the button and record what you felt. Once you have worn this monitor for the period of time provider prescribed (Usually 14 days), you will return the monitor device in the postage paid box. Once it is returned they will download the data collected and provide [place] with a report which the provider will then review and we will call you with those results. Important tips:  Avoid showering during the first 24 hours of wearing the monitor. Avoid excessive sweating to help maximize wear time. Do not submerge the device, no hot tubs, and no swimming pools. Keep any lotions or oils away from the patch. After 24 hours you may shower with the patch on. Take brief showers with your back facing the shower head.  Do not remove patch once it has been placed because that will interrupt data and decrease adhesive wear time. Push the button when you have any symptoms and write down what you were feeling. Once you have completed wearing your monitor, remove and place into box which has postage paid and  place in your outgoing mailbox.  If for some reason you have misplaced your box then call our office and we can provide another box and/or mail it off for you.

## 2021-03-14 ENCOUNTER — Other Ambulatory Visit: Payer: Self-pay

## 2021-03-14 ENCOUNTER — Other Ambulatory Visit (HOSPITAL_COMMUNITY)
Admission: RE | Admit: 2021-03-14 | Discharge: 2021-03-14 | Disposition: A | Discharge status: Home / Self Care | Payer: PPO | Source: Ambulatory Visit | Attending: Family Medicine | Admitting: Family Medicine

## 2021-03-14 ENCOUNTER — Encounter: Payer: Self-pay | Admitting: Family Medicine

## 2021-03-14 ENCOUNTER — Ambulatory Visit (INDEPENDENT_AMBULATORY_CARE_PROVIDER_SITE_OTHER): Payer: PPO | Admitting: Family Medicine

## 2021-03-14 VITALS — BP 98/64 | HR 82 | Temp 98.1°F | Ht <= 58 in | Wt 92.0 lb

## 2021-03-14 DIAGNOSIS — Z124 Encounter for screening for malignant neoplasm of cervix: Secondary | ICD-10-CM | POA: Diagnosis not present

## 2021-03-14 DIAGNOSIS — R42 Dizziness and giddiness: Secondary | ICD-10-CM | POA: Diagnosis not present

## 2021-03-14 DIAGNOSIS — Z1151 Encounter for screening for human papillomavirus (HPV): Secondary | ICD-10-CM | POA: Diagnosis not present

## 2021-03-14 DIAGNOSIS — Z0000 Encounter for general adult medical examination without abnormal findings: Secondary | ICD-10-CM

## 2021-03-14 DIAGNOSIS — Z01419 Encounter for gynecological examination (general) (routine) without abnormal findings: Secondary | ICD-10-CM | POA: Diagnosis not present

## 2021-03-14 MED ORDER — ONDANSETRON 4 MG PO TBDP: 4.0000 mg | ORAL_TABLET | Freq: Three times a day (TID) | ORAL | 0 refills | Status: DC | PRN

## 2021-03-14 NOTE — Progress Notes (Signed)
BP 98/64   Pulse 82   Temp 98.1 F (36.7 C)   Ht 4' 9.25" (1.454 m)   Wt 92 lb (41.7 kg)   SpO2 98%   BMI 19.74 kg/m    Subjective:    Patient ID: Stacy Taylor, female    DOB: 1990/09/04, 29 y.o.   MRN: [id number]  HPI: Stacy Taylor is a 30 y.o. female presenting on 03/14/2021 for comprehensive medical examination. Current medical complaints include:  Dizziness has been worse. Was worse with epley's. Saw cardiology. To have Holter.   Menopausal Symptoms: no  Depression Screen done today and results listed below:  Depression screen [employer] 2/9 03/14/2021 02/14/2021 01/03/2021 11/14/2020 02/16/2020  Decreased Interest _0 0 0  Down, Depressed, Hopeless 1 1 0 0 0  PHQ - 2 Score _1 0 0  Altered sleeping 2 1 - 2 0  Tired, decreased energy 3 1 - 2 0  Change in appetite 1 1 - 3 0  Feeling bad or failure about yourself  1 1 - 0 0  Trouble concentrating 2 1 - 2 0  Moving slowly or fidgety/restless 2 3 - 0 0  Suicidal thoughts 0 0 - 0 0  PHQ-9 Score 13 10 - 9 0  Difficult doing work/chores - - - Somewhat difficult Not difficult at all     Past Medical History:  Past Medical History:  Diagnosis Date   Anxiety    Asthma    Depression    Fetal alcohol syndrome    Migraines    Osteoporosis    Scoliosis    Seizures (HCC)     Surgical History:  Past Surgical History:  Procedure Laterality Date   FRACTURE SURGERY     HAND SURGERY     TONSILECTOMY, ADENOIDECTOMY, BILATERAL MYRINGOTOMY AND TUBES     WRIST SURGERY      Medications:  Current Outpatient Medications on File Prior to Visit  Medication Sig   albuterol (VENTOLIN HFA) 108 (90 Base) MCG/ACT inhaler Inhale 2 puffs into the lungs every 6 (six) hours as needed for wheezing or shortness of breath.   budesonide-formoterol (SYMBICORT) 160-4.5 MCG/ACT inhaler Inhale 2 puffs into the lungs 2 (two) times daily.   busPIRone (BUSPAR) 15 MG tablet Take 15 mg by mouth daily.   gabapentin (NEURONTIN) 400 MG capsule  Take 1 capsule (400 mg total) by mouth 3 (three) times daily.   hydrOXYzine (ATARAX/VISTARIL) 50 MG tablet TAKE 1 TABLET(50 MG) BY MOUTH DAILY AT BEDTIME   levonorgestrel (LILETTA, 52 MG,) 19.5 MCG/DAY IUD IUD 1 each by Intrauterine route once.   nortriptyline (PAMELOR) 10 MG capsule Take 10 mg nightly for a week, then increase to 20 mg nightly.   QUEtiapine (SEROQUEL) 50 MG tablet Take 1 tablet by mouth daily.   SUMAtriptan (IMITREX) 100 MG tablet Take 100 mg by mouth as directed.   Vitamin D, Ergocalciferol, (DRISDOL) 1.25 MG (50000 UNIT) CAPS capsule Take 1 capsule (50,000 Units total) by mouth once a week.   zoledronic acid (RECLAST) 5 MG/100ML SOLN injection Inject 5 mg into the vein once. Once yearly   butalbital-acetaminophen-caffeine (FIORICET) 50-325-40 MG tablet Take 1-2 tablets by mouth every 6 (six) hours as needed for headache. (Patient not taking: Reported on 03/12/2021)   rizatriptan (MAXALT) 10 MG tablet Take 1 tablet (10 mg total) by mouth as needed for migraine. May repeat in 2 hours if needed (Patient not taking: Reported on 11/14/2020)   No current facility-administered medications on  file prior to visit.    Allergies:  Allergies  Allergen Reactions   Ibuprofen     Abdominal pain, upset stomach    Social History:  Social History   Socioeconomic History   Marital status: Married    Spouse name: Not on file   Number of children: Not on file   Years of education: Not on file   Highest education level: Not on file  Occupational History   Not on file  Tobacco Use   Smoking status: Former    Types: Cigarettes   Smokeless tobacco: Never   Tobacco comments:    1 cig per day reported 03/14/2020  Vaping Use   Vaping Use: Every day  Substance and Sexual Activity   Alcohol use: Yes    Comment: socially   Drug use: Never   Sexual activity: Yes    Birth control/protection: I.U.D.  Other Topics Concern   Not on file  Social History Narrative   ** Merged History  Encounter **       Social Determinants of Health   Financial Resource Strain: Low Risk    Difficulty of Paying Living Expenses: Not hard at all  Food Insecurity: No Food Insecurity   Worried About Charity fundraiser in the Last Year: Never true   Arboriculturist in the Last Year: Never true  Transportation Needs: No Transportation Needs   Lack of Transportation (Medical): No   Lack of Transportation (Non-Medical): No  Physical Activity: Sufficiently Active   Days of Exercise per Week: 7 days   Minutes of Exercise per Session: 90 min  Stress: Stress Concern Present   Feeling of Stress : To some extent  Social Connections: Not on file  Intimate Partner Violence: Not on file   Social History   Tobacco Use  Smoking Status Former   Types: Cigarettes  Smokeless Tobacco Never  Tobacco Comments   1 cig per day reported 03/14/2020   Social History   Substance and Sexual Activity  Alcohol Use Yes   Comment: socially    Family History:  Family History  Problem Relation Age of Onset   Drug abuse Other    Mental illness Other     Past medical history, surgical history, medications, allergies, family history and social history reviewed with patient today and changes made to appropriate areas of the chart.   Review of Systems  Constitutional:  Positive for chills and diaphoresis. Negative for fever, malaise/fatigue and weight loss.  HENT:  Positive for tinnitus. Negative for congestion, ear discharge, ear pain, hearing loss, nosebleeds, sinus pain and sore throat.   Eyes: Negative.   Respiratory: Negative.  Negative for stridor.   Cardiovascular: Negative.   Gastrointestinal:  Positive for constipation. Negative for abdominal pain, blood in stool, diarrhea, heartburn, melena, nausea and vomiting.  Genitourinary: Negative.   Musculoskeletal: Negative.   Skin: Negative.   Neurological:  Positive for dizziness. Negative for tingling, tremors, sensory change, speech change,  focal weakness, seizures, loss of consciousness, weakness and headaches.  Endo/Heme/Allergies: Negative.   Psychiatric/Behavioral:  Positive for depression. Negative for hallucinations, memory loss, substance abuse and suicidal ideas. The patient is nervous/anxious. The patient does not have insomnia.   All other ROS negative except what is listed above and in the HPI.      Objective:    BP 98/64   Pulse 82   Temp 98.1 F (36.7 C)   Ht 4' 9.25" (1.454 m)   Wt 92 lb (41.7  kg)   SpO2 98%   BMI 19.74 kg/m   Wt Readings from Last 3 Encounters:  03/14/21 92 lb (41.7 kg)  03/12/21 95 lb 6.4 oz (43.3 kg)  02/14/21 92 lb 6.4 oz (41.9 kg)    Physical Exam Vitals and nursing note reviewed. Exam conducted with a chaperone present.  Constitutional:      General: She is not in acute distress.    Appearance: Normal appearance. She is not ill-appearing, toxic-appearing or diaphoretic.  HENT:     Head: Normocephalic and atraumatic.     Right Ear: Tympanic membrane, ear canal and external ear normal. There is no impacted cerumen.     Left Ear: Tympanic membrane, ear canal and external ear normal. There is no impacted cerumen.     Nose: Nose normal. No congestion or rhinorrhea.     Mouth/Throat:     Mouth: Mucous membranes are moist.     Pharynx: Oropharynx is clear. No oropharyngeal exudate or posterior oropharyngeal erythema.  Eyes:     General: No scleral icterus.       Right eye: No discharge.        Left eye: No discharge.     Extraocular Movements: Extraocular movements intact.     Conjunctiva/sclera: Conjunctivae normal.     Pupils: Pupils are equal, round, and reactive to light.  Neck:     Vascular: No carotid bruit.  Cardiovascular:     Rate and Rhythm: Normal rate and regular rhythm.     Pulses: Normal pulses.     Heart sounds: Normal heart sounds. No murmur heard.   No friction rub. No gallop.  Pulmonary:     Effort: Pulmonary effort is normal. No respiratory distress.      Breath sounds: Normal breath sounds. No stridor. No wheezing, rhonchi or rales.  Chest:     Chest wall: No tenderness.  Breasts:    Right: Normal.     Left: Normal.  Abdominal:     General: Abdomen is flat. Bowel sounds are normal. There is no distension.     Palpations: Abdomen is soft. There is no mass.     Tenderness: There is no abdominal tenderness. There is no right CVA tenderness, left CVA tenderness, guarding or rebound.     Hernia: No hernia is present.  Genitourinary:    General: Normal vulva.     Labia:        Right: No rash, tenderness, lesion or injury.        Left: No rash, tenderness, lesion or injury.      Vagina: Normal.     Cervix: Normal.     Comments: Breast and pelvic exams deferred with shared decision making Musculoskeletal:        General: No swelling, tenderness, deformity or signs of injury. Normal range of motion.     Cervical back: Normal range of motion and neck supple. No rigidity. No muscular tenderness.     Right lower leg: No edema.     Left lower leg: No edema.  Lymphadenopathy:     Cervical: No cervical adenopathy.  Skin:    General: Skin is warm and dry.     Capillary Refill: Capillary refill takes less than 2 seconds.     Coloration: Skin is not jaundiced or pale.     Findings: No bruising, erythema, lesion or rash.  Neurological:     General: No focal deficit present.     Mental Status: She is alert and oriented to  person, place, and time. Mental status is at baseline.     Cranial Nerves: No cranial nerve deficit.     Sensory: No sensory deficit.     Motor: No weakness.     Coordination: Coordination normal.     Gait: Gait normal.     Deep Tendon Reflexes: Reflexes normal.  Psychiatric:        Mood and Affect: Mood normal.        Behavior: Behavior normal.        Thought Content: Thought content normal.        Judgment: Judgment normal.    Results for orders placed or performed in visit on 02/14/21  Urine Culture   Specimen: Urine    UR  Result Value Ref Range   Urine Culture, Routine Final report    Organism ID, Bacteria Comment   Bayer DCA Hb A1c Waived  Result Value Ref Range   HB A1C (BAYER DCA - WAIVED) 3.9 (L) 4.8 - 5.6 %  CBC with Differential/Platelet  Result Value Ref Range   WBC 7.8 3.4 - 10.8 x10E3/uL   RBC 4.34 3.77 - 5.28 x10E6/uL   Hemoglobin 14.3 11.1 - 15.9 g/dL   Hematocrit 41.5 34.0 - 46.6 %   MCV 96 79 - 97 fL   MCH 32.9 26.6 - 33.0 pg   MCHC 34.5 31.5 - 35.7 g/dL   RDW 12.7 11.7 - 15.4 %   Platelets 151 150 - 450 x10E3/uL   Neutrophils 65 Not Estab. %   Lymphs 29 Not Estab. %   Monocytes 6 Not Estab. %   Eos 0 Not Estab. %   Basos 0 Not Estab. %   Neutrophils Absolute 5.0 1.4 - 7.0 x10E3/uL   Lymphocytes Absolute 2.2 0.7 - 3.1 x10E3/uL   Monocytes Absolute 0.4 0.1 - 0.9 x10E3/uL   EOS (ABSOLUTE) 0.0 0.0 - 0.4 x10E3/uL   Basophils Absolute 0.0 0.0 - 0.2 x10E3/uL   Immature Granulocytes 0 Not Estab. %   Immature Grans (Abs) 0.0 0.0 - 0.1 x10E3/uL  Comprehensive metabolic panel  Result Value Ref Range   Glucose 77 70 - 99 mg/dL   BUN 17 6 - 20 mg/dL   Creatinine, Ser 0.62 0.57 - 1.00 mg/dL   eGFR 122 >59 mL/min/1.73   BUN/Creatinine Ratio 27 (H) 9 - 23   Sodium 140 134 - 144 mmol/L   Potassium 3.7 3.5 - 5.2 mmol/L   Chloride 104 96 - 106 mmol/L   CO2 22 20 - 29 mmol/L   Calcium 9.0 8.7 - 10.2 mg/dL   Total Protein 6.8 6.0 - 8.5 g/dL   Albumin 4.7 3.8 - 4.8 g/dL   Globulin, Total 2.1 1.5 - 4.5 g/dL   Albumin/Globulin Ratio 2.2 1.2 - 2.2   Bilirubin Total 1.1 0.0 - 1.2 mg/dL   Alkaline Phosphatase 45 44 - 121 IU/L   AST 13 0 - 40 IU/L   ALT 9 0 - 32 IU/L  Lipid Panel w/o Chol/HDL Ratio  Result Value Ref Range   Cholesterol, Total 145 100 - 199 mg/dL   Triglycerides 31 0 - 149 mg/dL   HDL 60 >39 mg/dL   VLDL Cholesterol Cal 8 5 - 40 mg/dL   LDL Chol Calc (NIH) 77 0 - 99 mg/dL  TSH  Result Value Ref Range   TSH 0.709 0.450 - 4.500 uIU/mL  VITAMIN D 25 Hydroxy (Vit-D  Deficiency, Fractures)  Result Value Ref Range   Vit D, 25-Hydroxy 18.0 (L) 30.0 -  100.0 ng/mL  Prolactin  Result Value Ref Range   Prolactin 5.8 4.8 - 23.3 ng/mL  Hepatitis C Antibody  Result Value Ref Range   Hep C Virus Ab <0.1 0.0 - 0.9 s/co ratio  HIV Antibody (routine testing w rflx)  Result Value Ref Range   HIV Screen 4th Generation wRfx Non Reactive Non Reactive  Magnesium  Result Value Ref Range   Magnesium 2.0 1.6 - 2.3 mg/dL  Phosphorus  Result Value Ref Range   Phosphorus 2.2 (L) 3.0 - 4.3 mg/dL  Urinalysis, Routine w reflex microscopic  Result Value Ref Range   Specific Gravity, UA >1.030 (H) 1.005 - 1.030   pH, UA 5.5 5.0 - 7.5   Color, UA Yellow Yellow   Appearance Ur Clear Clear   Leukocytes,UA Negative Negative   Protein,UA Negative Negative/Trace   Glucose, UA Negative Negative   Ketones, UA Trace (A) Negative   RBC, UA Negative Negative   Bilirubin, UA Negative Negative   Urobilinogen, Ur 1.0 0.2 - 1.0 mg/dL   Nitrite, UA Negative Negative      Assessment & Plan:   Problem List Items Addressed This Visit   None Visit Diagnoses     Routine general medical examination at a health care facility    -  Primary   Vaccines up to date. Screening labs checked today. Pap done. Continue diet and exercise. Call with any concerns.    Screening for cervical cancer       Pap done today.   Relevant Orders   Cytology - PAP   Vertigo       Worse with epley's. Referral to PT placed today. Call with any concerns.    Relevant Orders   Ambulatory referral to Physical Therapy        Follow up plan: Return in about 6 months (around 09/11/2021).   LABORATORY TESTING:  - Pap smear: pap done  IMMUNIZATIONS:   - Tdap: Tetanus vaccination status reviewed: last tetanus booster within 10 years. - Influenza: Up to date - Pneumovax: Up to date - Prevnar: Not applicable - COVID: Up to date  PATIENT COUNSELING:   Advised to take 1 mg of folate supplement per day  if capable of pregnancy.   Sexuality: Discussed sexually transmitted diseases, partner selection, use of condoms, avoidance of unintended pregnancy  and contraceptive alternatives.   Advised to avoid cigarette smoking.  I discussed with the patient that most people either abstain from alcohol or drink within safe limits (<=14/week and <=4 drinks/occasion for males, <=7/weeks and <= 3 drinks/occasion for females) and that the risk for alcohol disorders and other health effects rises proportionally with the number of drinks per week and how often a drinker exceeds daily limits.  Discussed cessation/primary prevention of drug use and availability of treatment for abuse.   Diet: Encouraged to adjust caloric intake to maintain  or achieve ideal body weight, to reduce intake of dietary saturated fat and total fat, to limit sodium intake by avoiding high sodium foods and not adding table salt, and to maintain adequate dietary potassium and calcium preferably from fresh fruits, vegetables, and low-fat dairy products.    stressed the importance of regular exercise  Injury prevention: Discussed safety belts, safety helmets, smoke detector, smoking near bedding or upholstery.   Dental health: Discussed importance of regular tooth brushing, flossing, and dental visits.    NEXT PREVENTATIVE PHYSICAL DUE IN 1 YEAR. Return in about 6 months (around 09/11/2021).

## 2021-03-15 DIAGNOSIS — R011 Cardiac murmur, unspecified: Secondary | ICD-10-CM | POA: Diagnosis not present

## 2021-03-15 DIAGNOSIS — F419 Anxiety disorder, unspecified: Secondary | ICD-10-CM

## 2021-03-15 DIAGNOSIS — R42 Dizziness and giddiness: Secondary | ICD-10-CM | POA: Diagnosis not present

## 2021-03-19 LAB — CYTOLOGY - PAP
Comment: NEGATIVE
Diagnosis: NEGATIVE
High risk HPV: NEGATIVE

## 2021-04-01 ENCOUNTER — Encounter: Payer: Self-pay | Admitting: Family Medicine

## 2021-04-17 ENCOUNTER — Other Ambulatory Visit: Payer: PPO

## 2021-04-29 ENCOUNTER — Emergency Department
Admission: EM | Admit: 2021-04-29 | Discharge: 2021-04-29 | Disposition: A | Discharge status: Home / Self Care | Payer: PPO | Attending: Emergency Medicine | Admitting: Emergency Medicine

## 2021-04-29 ENCOUNTER — Encounter: Payer: Self-pay | Admitting: Emergency Medicine

## 2021-04-29 ENCOUNTER — Emergency Department: Payer: PPO

## 2021-04-29 ENCOUNTER — Other Ambulatory Visit: Payer: Self-pay

## 2021-04-29 ENCOUNTER — Ambulatory Visit: Payer: Self-pay | Admitting: *Deleted

## 2021-04-29 DIAGNOSIS — R0602 Shortness of breath: Secondary | ICD-10-CM | POA: Diagnosis not present

## 2021-04-29 DIAGNOSIS — J40 Bronchitis, not specified as acute or chronic: Secondary | ICD-10-CM | POA: Diagnosis not present

## 2021-04-29 DIAGNOSIS — J45909 Unspecified asthma, uncomplicated: Secondary | ICD-10-CM | POA: Diagnosis not present

## 2021-04-29 DIAGNOSIS — J040 Acute laryngitis: Secondary | ICD-10-CM | POA: Diagnosis not present

## 2021-04-29 LAB — COMPREHENSIVE METABOLIC PANEL
ALT: 11 U/L (ref 0–44)
AST: 12 U/L — ABNORMAL LOW (ref 15–41)
Albumin: 4.1 g/dL (ref 3.5–5.0)
Alkaline Phosphatase: 48 U/L (ref 38–126)
Anion gap: 4 — ABNORMAL LOW (ref 5–15)
BUN: 15 mg/dL (ref 6–20)
CO2: 26 mmol/L (ref 22–32)
Calcium: 8.3 mg/dL — ABNORMAL LOW (ref 8.9–10.3)
Chloride: 104 mmol/L (ref 98–111)
Creatinine, Ser: 0.68 mg/dL (ref 0.44–1.00)
GFR, Estimated: >60 mL/min (ref >60)
Glucose, Bld: 88 mg/dL (ref 70–99)
Potassium: 3.6 mmol/L (ref 3.5–5.1)
Sodium: 134 mmol/L — ABNORMAL LOW (ref 135–145)
Total Bilirubin: 1 mg/dL (ref 0.3–1.2)
Total Protein: 6.9 g/dL (ref 6.5–8.1)

## 2021-04-29 LAB — CBC WITH DIFFERENTIAL/PLATELET
Abs Immature Granulocytes: 0.03 10*3/uL (ref 0.00–0.07)
Basophils Absolute: 0 10*3/uL (ref 0.0–0.1)
Basophils Relative: 0 %
Eosinophils Absolute: 0.1 10*3/uL (ref 0.0–0.5)
Eosinophils Relative: 2 %
HCT: 37.6 % (ref 36.0–46.0)
Hemoglobin: 13.6 g/dL (ref 12.0–15.0)
Immature Granulocytes: 0 %
Lymphocytes Relative: 27 %
Lymphs Abs: 2.1 10*3/uL (ref 0.7–4.0)
MCH: 33 pg (ref 26.0–34.0)
MCHC: 36.2 g/dL — ABNORMAL HIGH (ref 30.0–36.0)
MCV: 91.3 fL (ref 80.0–100.0)
Monocytes Absolute: 0.4 10*3/uL (ref 0.1–1.0)
Monocytes Relative: 5 %
Neutro Abs: 5.1 10*3/uL (ref 1.7–7.7)
Neutrophils Relative %: 66 %
Platelets: 169 10*3/uL (ref 150–400)
RBC: 4.12 MIL/uL (ref 3.87–5.11)
RDW: 12.5 % (ref 11.5–15.5)
WBC: 7.7 10*3/uL (ref 4.0–10.5)
nRBC: 0 % (ref 0.0–0.2)

## 2021-04-29 MED ORDER — AZITHROMYCIN 250 MG PO TABS
ORAL_TABLET | ORAL | 0 refills | Status: DC
Start: 2021-04-29 — End: 2021-05-06

## 2021-04-29 MED ORDER — PREDNISONE 20 MG PO TABS: 40.0000 mg | ORAL_TABLET | Freq: Every day | ORAL | 0 refills | Status: AC

## 2021-04-29 MED ORDER — PREDNISONE 20 MG PO TABS
40.0000 mg | ORAL_TABLET | Freq: Once | ORAL | Status: AC
Start: 2021-04-29 — End: 2021-04-29
  Administered 2021-04-29: 40 mg via ORAL
  Filled 2021-04-29: qty 2

## 2021-04-29 MED ORDER — ALBUTEROL SULFATE HFA 108 (90 BASE) MCG/ACT IN AERS: 2.0000 | INHALATION_SPRAY | RESPIRATORY_TRACT | 0 refills | Status: DC | PRN

## 2021-04-29 MED ORDER — IPRATROPIUM-ALBUTEROL 0.5-2.5 (3) MG/3ML IN SOLN
3.0000 mL | Freq: Once | RESPIRATORY_TRACT | Status: AC
  Administered 2021-04-29: 3 mL via RESPIRATORY_TRACT
  Filled 2021-04-29: qty 3

## 2021-04-29 NOTE — ED Triage Notes (Signed)
Pt reports that she has been having issues with her asthma for over a week, states that she has been using her symbicort without relief, reports calling her dr who told her to come to the ER

## 2021-04-29 NOTE — Telephone Encounter (Signed)
Reason for Disposition  [1] MODERATE difficulty breathing (e.g., speaks in phrases, SOB even at rest, pulse 100-120) AND [2] NEW-onset or WORSE than normal  Answer Assessment - Initial Assessment Questions 1. RESPIRATORY STATUS: "Describe your breathing?" (e.g., wheezing, shortness of breath, unable to speak, severe coughing)      Struggling with breathing 2. ONSET: "When did this breathing problem begin?"      1 week 3. PATTERN "Does the difficult breathing come and go, or has it been constant since it started?"      Constant- inhaler was helping- but not working anymore 4. SEVERITY: "How bad is your breathing?" (e.g., mild, moderate, severe)    - MILD: No SOB at rest, mild SOB with walking, speaks normally in sentences, can lie down, no retractions, pulse < 100.    - MODERATE: SOB at rest, SOB with minimal exertion and prefers to sit, cannot lie down flat, speaks in phrases, mild retractions, audible wheezing, pulse 100-120.    - SEVERE: Very SOB at rest, speaks in single words, struggling to breathe, sitting hunched forward, retractions, pulse > 120      moderate 5. RECURRENT SYMPTOM: "Have you had difficulty breathing before?" If Yes, ask: "When was the last time?" and "What happened that time?"      asthma 6. CARDIAC HISTORY: "Do you have any history of heart disease?" (e.g., heart attack, angina, bypass surgery, angioplasty)      *No Answer* 7. LUNG HISTORY: "Do you have any history of lung disease?"  (e.g., pulmonary embolus, asthma, emphysema)     *No Answer* 8. CAUSE: "What do you think is causing the breathing problem?"      *No Answer* 9. OTHER SYMPTOMS: "Do you have any other symptoms? (e.g., dizziness, runny nose, cough, chest pain, fever)     *No Answer* 10. O2 SATURATION MONITOR:  "Do you use an oxygen saturation monitor (pulse oximeter) at home?" If Yes, "What is your reading (oxygen level) today?" "What is your usual oxygen saturation reading?" (e.g., 95%)       *No  Answer* 11. PREGNANCY: "Is there any chance you are pregnant?" "When was your last menstrual period?"       *No Answer* 12. TRAVEL: "Have you traveled out of the country in the last month?" (e.g., travel history, exposures)       *No Answer*  Protocols used: Breathing Difficulty-A-AH

## 2021-04-29 NOTE — ED Provider Notes (Signed)
Emergency Medicine Provider Triage Evaluation Note  Juno Alers , a 31 y.o. female  was evaluated in triage.  Pt complains of shortness of breath and voice change, sensation of throat tightness for the past week. No relief with prescribed inhalers for asthma..  Review of Systems  Positive: Throat tightness, shortness of breath Negative: Fever  Physical Exam  There were no vitals taken for this visit. Gen:   Awake, no distress   Resp:  Normal effort  MSK:   Moves extremities without difficulty  Other:    Medical Decision Making  Medically screening exam initiated at 8:57 AM.  Appropriate orders placed.  Ginnie Marich was informed that the remainder of the evaluation will be completed by another provider, this initial triage assessment does not replace that evaluation, and the importance of remaining in the ED until their evaluation is complete.    Chinita Pester, FNP 04/29/21 2707    Shaune Pollack, MD 04/29/21 [phone]

## 2021-04-29 NOTE — Telephone Encounter (Signed)
°  Chief Complaint: SOB Symptoms: trouble breathing, wet cough, wheezing, inhaler not working  Frequency: 1 week Pertinent Negatives: Patient denies   Disposition: [x] ED /[] Urgent Care (no appt availability in office) / [] Appointment(In office/virtual)/ []  [facility]/ [] Home Care/ [] Refused Recommended Disposition /[] [facility]/ []  Follow-up with PCP Additional Notes: Patient states her inhaler is not helping anymore

## 2021-04-29 NOTE — ED Notes (Signed)
See triage note  presents with some SOB and hoarseness  states she has been using her inhalers with min to no relief  afebrile on arrival

## 2021-04-29 NOTE — ED Provider Notes (Signed)
[facility] Provider Note    Event Date/Time   First MD Initiated Contact with Patient 04/29/21 1045     (approximate)   History   Asthma and Shortness of Breath   HPI  Stacy Taylor is a 31 y.o. female with past medical history of mild intermittent asthma here with cough, hoarseness, and sensation of throat fullness.  The patient states that for the last week, she has had persistent cough, wheezing, sore throat, and sensation like her throat was full.  It began with losing her voice as well, but this is now improved although her voice remains raspy.  She states that she has had cough, wheezing, and moderate shortness of breath, particularly with exertion.  She has had occasional sputum production.  She said subjective fevers originally but these have not persisted.  No nausea or vomiting.  No known specific sick contacts.  Denies history of hospitalizations for her asthma.  She has been using her home albuterol with minimal relief.  No other complaints.  No chest pain.  No history of PE.  No leg swelling.     Physical Exam   Triage Vital Signs: ED Triage Vitals  Enc Vitals Group     BP 04/29/21 0858 104/74     Pulse Rate 04/29/21 0858 (!) 107     Resp 04/29/21 0858 (!) 22     Temp 04/29/21 0858 98.1 F (36.7 C)     Temp Source 04/29/21 0858 Oral     SpO2 04/29/21 0858 99 %     Weight 04/29/21 0859 97 lb (44 kg)     Height 04/29/21 0859 4\' 7"  (1.397 m)     Head Circumference --      Peak Flow --      Pain Score 04/29/21 0859 0     Pain Loc --      Pain Edu? --      Excl. in GC? --     Most recent vital signs: Vitals:   04/29/21 0858  BP: 104/74  Pulse: (!) 107  Resp: (!) 22  Temp: 98.1 F (36.7 C)  SpO2: 99%     General: Awake, no distress.  CV:  Good peripheral perfusion.  No murmurs, rubs, or gallops. Resp:  Normal effort.  Minimal end expiratory wheeze with forced expiration.  Normal work of breathing.  Speaking in full  sentences. Abd:  No distention.  No tenderness. Other:  No overt hoarseness noted.  No stridor.  Neck supple with no crepitance.  Normal airflow appreciated on inspiration and expiration.   ED Results / Procedures / Treatments   Labs (all labs ordered are listed, but only abnormal results are displayed) Labs Reviewed  CBC WITH DIFFERENTIAL/PLATELET - Abnormal; Notable for the following components:      Result Value   MCHC 36.2 (*)    All other components within normal limits  COMPREHENSIVE METABOLIC PANEL - Abnormal; Notable for the following components:   Sodium 134 (*)    Calcium 8.3 (*)    AST 12 (*)    Anion gap 4 (*)    All other components within normal limits     EKG     RADIOLOGY  Chest: I reviewed the images, which to me showed no acute abnormality.  Agree with radiology read.   PROCEDURES:  Critical Care performed: No  Procedures   MEDICATIONS ORDERED IN ED: Medications  ipratropium-albuterol (DUONEB) 0.5-2.5 (3) MG/3ML nebulizer solution 3 mL (3 mLs Nebulization Given  04/29/21 1111)  predniSONE (DELTASONE) tablet 40 mg (40 mg Oral Given 04/29/21 1111)     IMPRESSION / MDM / ASSESSMENT AND PLAN / ED COURSE  I reviewed the triage vital signs and the nursing notes.                              Differential diagnosis includes, but is not limited to, asthma, URI, laryngitis, bronchitis, community-acquired pneumonia, pharyngitis.  Well-appearing 31 year old female with past medical history of asthma here with shortness of breath and subjective throat fullness/soreness.  Patient has a history of asthma as well as mild anxiety per review of records, most recently PCP visit from 11/18.  Today, I suspect patient likely has URI/bronchitis with throat irritation related to coughing.  Differential includes mild pharyngitis.  She is status post tonsillectomy.  The uvula is midline.  She has no clinical evidence to suggest peritonsillar abscess, RPA, or epiglottitis.   Regarding her shortness of breath, she is satting well on room air with only minimal end expiratory wheezes consistent with her asthma.  Chest x-ray shows no pneumonia or pneumothorax.  Will treat with steroids, as well as empiric antibiotics for possible component of bacterial pharyngitis, though she does not appear septic or otherwise toxic.  Will give good return precautions and encourage p.o. hydration.  No other complaints.  No signs to suggest ACS or PE.  I considered admission but patient is very well-appearing, not hypoxic, has normal work of breathing, and no signs of impending airway compromise or significant upper airway edema, so feel she can be reasonably managed initially as an outpatient.        FINAL CLINICAL IMPRESSION(S) / ED DIAGNOSES   Final diagnoses:  Bronchitis  Laryngitis     Rx / DC Orders   ED Discharge Orders          Ordered    predniSONE (DELTASONE) 20 MG tablet  Daily        04/29/21 1223    azithromycin (ZITHROMAX Z-PAK) 250 MG tablet        04/29/21 1223    albuterol (VENTOLIN HFA) 108 (90 Base) MCG/ACT inhaler  Every 4 hours PRN        04/29/21 1223             Note:  This document was prepared using Dragon voice recognition software and may include unintentional dictation errors.   Shaune Pollack, MD 04/29/21 1224

## 2021-05-05 DIAGNOSIS — R42 Dizziness and giddiness: Secondary | ICD-10-CM | POA: Diagnosis not present

## 2021-05-06 ENCOUNTER — Encounter: Payer: Self-pay | Admitting: Family Medicine

## 2021-05-06 ENCOUNTER — Other Ambulatory Visit: Payer: Self-pay

## 2021-05-06 ENCOUNTER — Ambulatory Visit (INDEPENDENT_AMBULATORY_CARE_PROVIDER_SITE_OTHER): Payer: PPO | Admitting: Family Medicine

## 2021-05-06 VITALS — BP 102/69 | HR 107 | Temp 98.1°F | Wt 93.8 lb

## 2021-05-06 DIAGNOSIS — J40 Bronchitis, not specified as acute or chronic: Secondary | ICD-10-CM | POA: Diagnosis not present

## 2021-05-06 DIAGNOSIS — R42 Dizziness and giddiness: Secondary | ICD-10-CM | POA: Diagnosis not present

## 2021-05-06 DIAGNOSIS — F250 Schizoaffective disorder, bipolar type: Secondary | ICD-10-CM

## 2021-05-06 NOTE — Progress Notes (Signed)
BP 102/69    Pulse (!) 107    Temp 98.1 F (36.7 C)    Wt 93 lb 12.8 oz (42.5 kg)    SpO2 99%    BMI 21.80 kg/m    Subjective:    Patient ID: Stacy Taylor, female    DOB: Oct 23, 1990, 31 y.o.   MRN: [id number]  HPI: Stacy Taylor is a 31 y.o. female  Chief Complaint  Patient presents with   Bronchitis    Patient states she went to ED last week and was diagnosed with bronchitis and laryngitis. Patient is better today.    Referral    Patient would like referral to a new psychiatrist, she has lost contact with her old one   ER FOLLOW UP Time since discharge: 7 days Hospital/facility: [facility] Diagnosis: bronchitis  Procedures/tests: CXR Consultants: none New medications: azithromycin and steroids Discharge instructions:  follow up here Status: better  PT notes that her dizziness is not getting better with PT. They advise her to follow up with neuro.   She notes that she has been trying to call her psychiatrist and has not been able to get them on the phone to schedule an appointment. She would like to get back in to see them.   Relevant past medical, surgical, family and social history reviewed and updated as indicated. Interim medical history since our last visit reviewed. Allergies and medications reviewed and updated.  Review of Systems  Constitutional: Negative.   HENT: Negative.    Eyes: Negative.   Respiratory: Negative.    Cardiovascular: Negative.    Per HPI unless specifically indicated above     Objective:    BP 102/69    Pulse (!) 107    Temp 98.1 F (36.7 C)    Wt 93 lb 12.8 oz (42.5 kg)    SpO2 99%    BMI 21.80 kg/m   Wt Readings from Last 3 Encounters:  05/06/21 93 lb 12.8 oz (42.5 kg)  04/29/21 97 lb (44 kg)  03/14/21 92 lb (41.7 kg)    Physical Exam Vitals and nursing note reviewed.  Constitutional:      General: She is not in acute distress.    Appearance: Normal appearance. She is not ill-appearing, toxic-appearing or diaphoretic.  HENT:      Head: Normocephalic and atraumatic.     Right Ear: External ear normal.     Left Ear: External ear normal.     Nose: Nose normal.     Mouth/Throat:     Mouth: Mucous membranes are moist.     Pharynx: Oropharynx is clear.  Eyes:     General: No scleral icterus.       Right eye: No discharge.        Left eye: No discharge.     Extraocular Movements: Extraocular movements intact.     Conjunctiva/sclera: Conjunctivae normal.     Pupils: Pupils are equal, round, and reactive to light.  Cardiovascular:     Rate and Rhythm: Normal rate and regular rhythm.     Pulses: Normal pulses.     Heart sounds: Normal heart sounds. No murmur heard.   No friction rub. No gallop.  Pulmonary:     Effort: Pulmonary effort is normal. No respiratory distress.     Breath sounds: Normal breath sounds. No stridor. No wheezing, rhonchi or rales.  Chest:     Chest wall: No tenderness.  Musculoskeletal:        General: Normal range of motion.  Cervical back: Normal range of motion and neck supple.  Skin:    General: Skin is warm and dry.     Capillary Refill: Capillary refill takes less than 2 seconds.     Coloration: Skin is not jaundiced or pale.     Findings: No bruising, erythema, lesion or rash.  Neurological:     General: No focal deficit present.     Mental Status: She is alert and oriented to person, place, and time. Mental status is at baseline.  Psychiatric:        Mood and Affect: Mood normal.        Behavior: Behavior normal.        Thought Content: Thought content normal.        Judgment: Judgment normal.    Results for orders placed or performed during the hospital encounter of 04/29/21  CBC with Differential  Result Value Ref Range   WBC 7.7 4.0 - 10.5 K/uL   RBC 4.12 3.87 - 5.11 MIL/uL   Hemoglobin 13.6 12.0 - 15.0 g/dL   HCT 37.6 36.0 - 46.0 %   MCV 91.3 80.0 - 100.0 fL   MCH 33.0 26.0 - 34.0 pg   MCHC 36.2 (H) 30.0 - 36.0 g/dL   RDW 12.5 11.5 - 15.5 %   Platelets 169  150 - 400 K/uL   nRBC 0.0 0.0 - 0.2 %   Neutrophils Relative % 66 %   Neutro Abs 5.1 1.7 - 7.7 K/uL   Lymphocytes Relative 27 %   Lymphs Abs 2.1 0.7 - 4.0 K/uL   Monocytes Relative 5 %   Monocytes Absolute 0.4 0.1 - 1.0 K/uL   Eosinophils Relative 2 %   Eosinophils Absolute 0.1 0.0 - 0.5 K/uL   Basophils Relative 0 %   Basophils Absolute 0.0 0.0 - 0.1 K/uL   Immature Granulocytes 0 %   Abs Immature Granulocytes 0.03 0.00 - 0.07 K/uL  Comprehensive metabolic panel  Result Value Ref Range   Sodium 134 (L) 135 - 145 mmol/L   Potassium 3.6 3.5 - 5.1 mmol/L   Chloride 104 98 - 111 mmol/L   CO2 26 22 - 32 mmol/L   Glucose, Bld 88 70 - 99 mg/dL   BUN 15 6 - 20 mg/dL   Creatinine, Ser 0.68 0.44 - 1.00 mg/dL   Calcium 8.3 (L) 8.9 - 10.3 mg/dL   Total Protein 6.9 6.5 - 8.1 g/dL   Albumin 4.1 3.5 - 5.0 g/dL   AST 12 (L) 15 - 41 U/L   ALT 11 0 - 44 U/L   Alkaline Phosphatase 48 38 - 126 U/L   Total Bilirubin 1.0 0.3 - 1.2 mg/dL   GFR, Estimated >60 >60 mL/min   Anion gap 4 (L) 5 - 15      Assessment & Plan:   Problem List Items Addressed This Visit       Other   Schizoaffective disorder, bipolar type ([place])    Has not been able to get in touch with her psychiatrist. Will see about getting her back in with Dr. Shea Evans. Has not seen her since April 2022.      Relevant Orders   Ambulatory referral to Psychiatry   Other Visit Diagnoses     Bronchitis    -  Primary   Resolved. Lungs clear. Call with any concerns.    Vertigo       No better with PT. Will get her back into neurology. Call with any concerns.  Follow up plan: Return if symptoms worsen or fail to improve.

## 2021-05-06 NOTE — Assessment & Plan Note (Signed)
Has not been able to get in touch with her psychiatrist. Will see about getting her back in with Dr. Elna Breslow. Has not seen her since April 2022.

## 2021-05-15 ENCOUNTER — Encounter: Payer: Self-pay | Admitting: Nurse Practitioner

## 2021-05-15 ENCOUNTER — Other Ambulatory Visit: Payer: Self-pay

## 2021-05-15 ENCOUNTER — Ambulatory Visit (INDEPENDENT_AMBULATORY_CARE_PROVIDER_SITE_OTHER): Payer: PPO | Admitting: Nurse Practitioner

## 2021-05-15 DIAGNOSIS — H6123 Impacted cerumen, bilateral: Secondary | ICD-10-CM

## 2021-05-15 NOTE — Assessment & Plan Note (Signed)
Bilateral ears with impacted cerumen, CMA irrigated in office with full clearance.  Able to view TM bilaterally without issue after irrigation.  Patient tolerated procedure well with no complaints.  Return to office as needed.

## 2021-05-15 NOTE — Patient Instructions (Signed)
Earwax Buildup, Adult ?The ears produce a substance called earwax that helps keep bacteria out of the ear and protects the skin in the ear canal. Occasionally, earwax can build up in the ear and cause discomfort or hearing loss. ?What are the causes? ?This condition is caused by a buildup of earwax. Ear canals are self-cleaning. Ear wax is made in the outer part of the ear canal and generally falls out in small amounts over time. ?When the self-cleaning mechanism is not working, earwax builds up and can cause decreased hearing and discomfort. Attempting to clean ears with cotton swabs can push the earwax deep into the ear canal and cause decreased hearing and pain. ?What increases the risk? ?This condition is more likely to develop in people who: ?Clean their ears often with cotton swabs. ?Pick at their ears. ?Use earplugs or in-ear headphones often, or wear hearing aids. ?The following factors may also make you more likely to develop this condition: ?Being female. ?Being of older age. ?Naturally producing more earwax. ?Having narrow ear canals. ?Having earwax that is overly thick or sticky. ?Having excess hair in the ear canal. ?Having eczema. ?Being dehydrated. ?What are the signs or symptoms? ?Symptoms of this condition include: ?Reduced or muffled hearing. ?A feeling of fullness in the ear or feeling that the ear is plugged. ?Fluid coming from the ear. ?Ear pain or an itchy ear. ?Ringing in the ear. ?Coughing. ?Balance problems. ?An obvious piece of earwax that can be seen inside the ear canal. ?How is this diagnosed? ?This condition may be diagnosed based on: ?Your symptoms. ?Your medical history. ?An ear exam. During the exam, your health care provider will look into your ear with an instrument called an otoscope. ?You may have tests, including a hearing test. ?How is this treated? ?This condition may be treated by: ?Using ear drops to soften the earwax. ?Having the earwax removed by a health care provider. The  health care provider may: ?Flush the ear with water. ?Use an instrument that has a loop on the end (curette). ?Use a suction device. ?Having surgery to remove the wax buildup. This may be done in severe cases. ?Follow these instructions at home: ? ?Take over-the-counter and prescription medicines only as told by your health care provider. ?Do not put any objects, including cotton swabs, into your ear. You can clean the opening of your ear canal with a washcloth or facial tissue. ?Follow instructions from your health care provider about cleaning your ears. Do not overclean your ears. ?Drink enough fluid to keep your urine pale yellow. This will help to thin the earwax. ?Keep all follow-up visits as told. If earwax builds up in your ears often or if you use hearing aids, consider seeing your health care provider for routine, preventive ear cleanings. Ask your health care provider how often you should schedule your cleanings. ?If you have hearing aids, clean them according to instructions from the manufacturer and your health care provider. ?Contact a health care provider if: ?You have ear pain. ?You develop a fever. ?You have pus or other fluid coming from your ear. ?You have hearing loss. ?You have ringing in your ears that does not go away. ?You feel like the room is spinning (vertigo). ?Your symptoms do not improve with treatment. ?Get help right away if: ?You have bleeding from the affected ear. ?You have severe ear pain. ?Summary ?Earwax can build up in the ear and cause discomfort or hearing loss. ?The most common symptoms of this condition include   reduced or muffled hearing, a feeling of fullness in the ear, or feeling that the ear is plugged. ?This condition may be diagnosed based on your symptoms, your medical history, and an ear exam. ?This condition may be treated by using ear drops to soften the earwax or by having the earwax removed by a health care provider. ?Do not put any objects, including cotton  swabs, into your ear. You can clean the opening of your ear canal with a washcloth or facial tissue. ?This information is not intended to replace advice given to you by your health care provider. Make sure you discuss any questions you have with your health care provider. ?Document Revised: 08/01/2019 Document Reviewed: 08/01/2019 ?Elsevier Patient Education ? 2022 Elsevier Inc[employer] ? ?

## 2021-05-15 NOTE — Progress Notes (Signed)
BP (!) 90/56    Pulse 80    Temp 98.2 F (36.8 C) (Oral)    Ht 4\' 7"  (1.397 m)    Wt 94 lb (42.6 kg)    SpO2 98%    BMI 21.85 kg/m    Subjective:    Patient ID: Stacy Taylor, female    DOB: 1990/10/02, 31 y.o.   MRN: [id number]  HPI: Stacy Taylor is a 31 y.o. female  Chief Complaint  Patient presents with   Ear Fullness    Patient states she is here as she can hear out of her R ear. Patient states she has tried over the counter ear drops to help and it isn't helping and she is here to have a provider look at it.    CERUMEN IMPACTION Started with loss of hearing 4 days ago -- happens every couple months, but this time ear drops did not work. Duration: days Involved ear(s): bilateral L>R Fever: no Otorrhea: no Upper respiratory infection symptoms: no Pruritus: no Hearing loss: yes Water immersion no Using Q-tips:  tries not to Status: stable Treatments attempted:  ear drops at home    Relevant past medical, surgical, family and social history reviewed and updated as indicated. Interim medical history since our last visit reviewed. Allergies and medications reviewed and updated.  Review of Systems  Constitutional:  Negative for activity change, appetite change, diaphoresis, fatigue and fever.  HENT:  Positive for hearing loss.   Respiratory: Negative.    Cardiovascular: Negative.   Neurological: Negative.   Psychiatric/Behavioral: Negative.     Per HPI unless specifically indicated above     Objective:    BP (!) 90/56    Pulse 80    Temp 98.2 F (36.8 C) (Oral)    Ht 4\' 7"  (1.397 m)    Wt 94 lb (42.6 kg)    SpO2 98%    BMI 21.85 kg/m   Wt Readings from Last 3 Encounters:  05/15/21 94 lb (42.6 kg)  05/06/21 93 lb 12.8 oz (42.5 kg)  04/29/21 97 lb (44 kg)    Physical Exam Vitals and nursing note reviewed.  Constitutional:      General: She is awake. She is not in acute distress.    Appearance: She is well-developed and well-groomed. She is not  ill-appearing or toxic-appearing.  HENT:     Head: Normocephalic.     Right Ear: Hearing, ear canal and external ear normal. There is impacted cerumen.     Left Ear: Hearing, ear canal and external ear normal. There is impacted cerumen.     Ears:     Comments: Bilateral ears with impacted cerumen L>R. Eyes:     General: Lids are normal.        Right eye: No discharge.        Left eye: No discharge.     Conjunctiva/sclera: Conjunctivae normal.     Pupils: Pupils are equal, round, and reactive to light.  Neck:     Thyroid: No thyromegaly.  Cardiovascular:     Rate and Rhythm: Normal rate and regular rhythm.     Heart sounds: Normal heart sounds. No murmur heard.   No gallop.  Pulmonary:     Effort: Pulmonary effort is normal. No accessory muscle usage or respiratory distress.     Breath sounds: Normal breath sounds.  Abdominal:     General: Bowel sounds are normal.     Palpations: Abdomen is soft.  Musculoskeletal:  Cervical back: Normal range of motion and neck supple.     Right lower leg: No edema.     Left lower leg: No edema.  Lymphadenopathy:     Cervical: No cervical adenopathy.  Skin:    General: Skin is warm and dry.  Neurological:     Mental Status: She is alert and oriented to person, place, and time.  Psychiatric:        Attention and Perception: Attention normal.        Mood and Affect: Mood normal.        Speech: Speech normal.        Behavior: Behavior normal. Behavior is cooperative.        Thought Content: Thought content normal.   Results for orders placed or performed during the hospital encounter of 04/29/21  CBC with Differential  Result Value Ref Range   WBC 7.7 4.0 - 10.5 K/uL   RBC 4.12 3.87 - 5.11 MIL/uL   Hemoglobin 13.6 12.0 - 15.0 g/dL   HCT 35.3 29.9 - 24.2 %   MCV 91.3 80.0 - 100.0 fL   MCH 33.0 26.0 - 34.0 pg   MCHC 36.2 (H) 30.0 - 36.0 g/dL   RDW 68.3 41.9 - 62.2 %   Platelets 169 150 - 400 K/uL   nRBC 0.0 0.0 - 0.2 %    Neutrophils Relative % 66 %   Neutro Abs 5.1 1.7 - 7.7 K/uL   Lymphocytes Relative 27 %   Lymphs Abs 2.1 0.7 - 4.0 K/uL   Monocytes Relative 5 %   Monocytes Absolute 0.4 0.1 - 1.0 K/uL   Eosinophils Relative 2 %   Eosinophils Absolute 0.1 0.0 - 0.5 K/uL   Basophils Relative 0 %   Basophils Absolute 0.0 0.0 - 0.1 K/uL   Immature Granulocytes 0 %   Abs Immature Granulocytes 0.03 0.00 - 0.07 K/uL  Comprehensive metabolic panel  Result Value Ref Range   Sodium 134 (L) 135 - 145 mmol/L   Potassium 3.6 3.5 - 5.1 mmol/L   Chloride 104 98 - 111 mmol/L   CO2 26 22 - 32 mmol/L   Glucose, Bld 88 70 - 99 mg/dL   BUN 15 6 - 20 mg/dL   Creatinine, Ser 2.97 0.44 - 1.00 mg/dL   Calcium 8.3 (L) 8.9 - 10.3 mg/dL   Total Protein 6.9 6.5 - 8.1 g/dL   Albumin 4.1 3.5 - 5.0 g/dL   AST 12 (L) 15 - 41 U/L   ALT 11 0 - 44 U/L   Alkaline Phosphatase 48 38 - 126 U/L   Total Bilirubin 1.0 0.3 - 1.2 mg/dL   GFR, Estimated >98 >92 mL/min   Anion gap 4 (L) 5 - 15      Assessment & Plan:   Problem List Items Addressed This Visit       Nervous and Auditory   Bilateral impacted cerumen    Bilateral ears with impacted cerumen, CMA irrigated in office with full clearance.  Able to view TM bilaterally without issue after irrigation.  Patient tolerated procedure well with no complaints.  Return to office as needed.          Follow up plan: Return if symptoms worsen or fail to improve.

## 2021-05-20 DIAGNOSIS — F411 Generalized anxiety disorder: Secondary | ICD-10-CM | POA: Diagnosis not present

## 2021-05-20 DIAGNOSIS — R4689 Other symptoms and signs involving appearance and behavior: Secondary | ICD-10-CM | POA: Diagnosis not present

## 2021-05-20 DIAGNOSIS — R42 Dizziness and giddiness: Secondary | ICD-10-CM | POA: Diagnosis not present

## 2021-05-20 DIAGNOSIS — R413 Other amnesia: Secondary | ICD-10-CM | POA: Diagnosis not present

## 2021-05-20 DIAGNOSIS — R569 Unspecified convulsions: Secondary | ICD-10-CM | POA: Diagnosis not present

## 2021-05-20 DIAGNOSIS — G43109 Migraine with aura, not intractable, without status migrainosus: Secondary | ICD-10-CM | POA: Diagnosis not present

## 2021-05-21 ENCOUNTER — Other Ambulatory Visit: Payer: PPO

## 2021-05-23 ENCOUNTER — Other Ambulatory Visit: Payer: Self-pay | Admitting: Physician Assistant

## 2021-05-23 DIAGNOSIS — R42 Dizziness and giddiness: Secondary | ICD-10-CM

## 2021-05-23 DIAGNOSIS — R519 Headache, unspecified: Secondary | ICD-10-CM

## 2021-05-23 DIAGNOSIS — H53149 Visual discomfort, unspecified: Secondary | ICD-10-CM

## 2021-05-23 DIAGNOSIS — R569 Unspecified convulsions: Secondary | ICD-10-CM

## 2021-06-12 ENCOUNTER — Other Ambulatory Visit: Payer: PPO

## 2021-06-16 ENCOUNTER — Ambulatory Visit: Admission: RE | Admit: 2021-06-16 | Payer: PPO | Source: Ambulatory Visit

## 2021-07-01 DIAGNOSIS — R569 Unspecified convulsions: Secondary | ICD-10-CM | POA: Diagnosis not present

## 2021-07-01 DIAGNOSIS — F411 Generalized anxiety disorder: Secondary | ICD-10-CM | POA: Diagnosis not present

## 2021-07-01 DIAGNOSIS — R519 Headache, unspecified: Secondary | ICD-10-CM | POA: Diagnosis not present

## 2021-07-11 ENCOUNTER — Encounter: Payer: Self-pay | Admitting: Family Medicine

## 2021-07-30 ENCOUNTER — Other Ambulatory Visit: Payer: PPO

## 2021-08-08 ENCOUNTER — Telehealth (INDEPENDENT_AMBULATORY_CARE_PROVIDER_SITE_OTHER): Payer: PPO | Admitting: Psychiatry

## 2021-08-08 ENCOUNTER — Encounter: Payer: Self-pay | Admitting: Psychiatry

## 2021-08-08 DIAGNOSIS — F250 Schizoaffective disorder, bipolar type: Secondary | ICD-10-CM | POA: Diagnosis not present

## 2021-08-08 DIAGNOSIS — F17201 Nicotine dependence, unspecified, in remission: Secondary | ICD-10-CM

## 2021-08-08 DIAGNOSIS — Z79899 Other long term (current) drug therapy: Secondary | ICD-10-CM | POA: Diagnosis not present

## 2021-08-08 DIAGNOSIS — Z87898 Personal history of other specified conditions: Secondary | ICD-10-CM | POA: Diagnosis not present

## 2021-08-08 MED ORDER — BUSPIRONE HCL 15 MG PO TABS: 15.0000 mg | ORAL_TABLET | Freq: Two times a day (BID) | ORAL | 0 refills | Status: DC

## 2021-08-08 MED ORDER — QUETIAPINE FUMARATE 50 MG PO TABS: 50.0000 mg | ORAL_TABLET | Freq: Every day | ORAL | 0 refills | Status: DC
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
## 2021-08-08 NOTE — Patient Instructions (Signed)
[email or web address] ? ?[email or web address] ? ?

## 2021-08-27 ENCOUNTER — Encounter: Payer: Self-pay | Admitting: Emergency Medicine

## 2021-08-27 ENCOUNTER — Emergency Department: Payer: PPO

## 2021-08-27 ENCOUNTER — Emergency Department
Admission: EM | Admit: 2021-08-27 | Discharge: 2021-08-27 | Disposition: A | Discharge status: Home / Self Care | Payer: PPO | Attending: Emergency Medicine | Admitting: Emergency Medicine

## 2021-08-27 ENCOUNTER — Ambulatory Visit: Payer: PPO | Admitting: Nurse Practitioner

## 2021-08-27 ENCOUNTER — Other Ambulatory Visit: Payer: Self-pay

## 2021-08-27 DIAGNOSIS — S8002XA Contusion of left knee, initial encounter: Secondary | ICD-10-CM | POA: Diagnosis not present

## 2021-08-27 DIAGNOSIS — S8011XA Contusion of right lower leg, initial encounter: Secondary | ICD-10-CM | POA: Diagnosis not present

## 2021-08-27 DIAGNOSIS — J45909 Unspecified asthma, uncomplicated: Secondary | ICD-10-CM | POA: Diagnosis not present

## 2021-08-27 DIAGNOSIS — M25562 Pain in left knee: Secondary | ICD-10-CM | POA: Diagnosis not present

## 2021-08-27 DIAGNOSIS — H539 Unspecified visual disturbance: Secondary | ICD-10-CM | POA: Diagnosis not present

## 2021-08-27 DIAGNOSIS — S8001XA Contusion of right knee, initial encounter: Secondary | ICD-10-CM | POA: Insufficient documentation

## 2021-08-27 DIAGNOSIS — S0990XA Unspecified injury of head, initial encounter: Secondary | ICD-10-CM | POA: Insufficient documentation

## 2021-08-27 DIAGNOSIS — S199XXA Unspecified injury of neck, initial encounter: Secondary | ICD-10-CM | POA: Diagnosis not present

## 2021-08-27 DIAGNOSIS — Z720 Tobacco use: Secondary | ICD-10-CM | POA: Diagnosis not present

## 2021-08-27 DIAGNOSIS — R519 Headache, unspecified: Secondary | ICD-10-CM | POA: Diagnosis not present

## 2021-08-27 DIAGNOSIS — S8991XA Unspecified injury of right lower leg, initial encounter: Secondary | ICD-10-CM | POA: Diagnosis present

## 2021-08-27 DIAGNOSIS — M25561 Pain in right knee: Secondary | ICD-10-CM | POA: Diagnosis not present

## 2021-08-27 DIAGNOSIS — S0031XA Abrasion of nose, initial encounter: Secondary | ICD-10-CM | POA: Diagnosis not present

## 2021-08-27 DIAGNOSIS — S8012XA Contusion of left lower leg, initial encounter: Secondary | ICD-10-CM | POA: Diagnosis not present

## 2021-08-27 DIAGNOSIS — G44309 Post-traumatic headache, unspecified, not intractable: Secondary | ICD-10-CM | POA: Diagnosis not present

## 2021-08-27 DIAGNOSIS — Y92410 Unspecified street and highway as the place of occurrence of the external cause: Secondary | ICD-10-CM | POA: Diagnosis not present

## 2021-08-27 MED ORDER — ACETAMINOPHEN 325 MG PO TABS
650.0000 mg | ORAL_TABLET | Freq: Once | ORAL | Status: AC
Start: 2021-08-27 — End: 2021-08-27
  Administered 2021-08-27: 650 mg via ORAL
  Filled 2021-08-27: qty 2
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
## 2021-08-27 NOTE — Discharge Instructions (Signed)
Your imaging was normal. Rest and ice your knee (20 minutes off, 20 minutes on). You may continue to take tylenol 650mg  every 6-8 hours as needed for pain. Please return for new, worsening, or change in symptoms or other concerns. It was a pleasure caring for you. ?
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
## 2021-08-28 ENCOUNTER — Telehealth: Payer: Self-pay | Admitting: *Deleted

## 2021-08-28 NOTE — Telephone Encounter (Signed)
Transition Care Management Follow-up Telephone Call ?Date of discharge and from where:  [facility]  08-[facility] ?How have you been since you were released from the hospital? Very sore ?Any questions or concerns? Yes ?Need something stronger than tylenol  follow up scheduled ? ?Items Reviewed: ?Did the pt receive and understand the discharge instructions provided? Yes  ?Medications obtained and verified?    ?Other? No  ?Any new allergies since your discharge? No  ?Dietary orders reviewed? No ?Do you have support at home? Yes  ? ?Home Care and Equipment/Supplies: ?Were home health services ordered? not applicable ?If so, what is the name of the agency?   ?Has the agency set up a time to come to the patient's home?  ?Were any new equipment or medical supplies ordered?  No ?What is the name of the medical supply agency?  ?Were you able to get the supplies/equipment? not applicable ?Do you have any questions related to the use of the equipment or supplies? No ? ?Functional Questionnaire: (I = Independent and D = Dependent) ?ADLs: I ? ?Bathing/Dressing- I ? ?Meal Prep- I ? ?Eating- I ? ?Maintaining continence- I ? ?Transferring/Ambulation- I ? ?Managing Meds- I ? ?Follow up appointments reviewed: ? ?PCP Hospital f/u appt confirmed? Yes  Scheduled to see Laural Benes on 5-5 @ 9:20. ?Specialist Hospital f/u appt confirmed? No  Scheduled to see  on  @ . ?Are transportation arrangements needed? no ?If their condition worsens, is the pt aware to call PCP or go to the Emergency Dept.? Yes ?Was the patient provided with contact information for the PCP's office or ED? Yes ?Was to pt encouraged to call back with questions or concerns? Yes  ?

## 2021-08-29 ENCOUNTER — Encounter: Payer: Self-pay | Admitting: Family Medicine

## 2021-08-29 ENCOUNTER — Ambulatory Visit (INDEPENDENT_AMBULATORY_CARE_PROVIDER_SITE_OTHER): Payer: PPO | Admitting: Family Medicine

## 2021-08-29 ENCOUNTER — Encounter: Payer: Self-pay | Admitting: Cardiology

## 2021-08-29 VITALS — BP 103/69 | HR 96 | Temp 98.4°F | Wt 97.6 lb

## 2021-08-29 DIAGNOSIS — M25562 Pain in left knee: Secondary | ICD-10-CM | POA: Diagnosis not present

## 2021-08-29 DIAGNOSIS — M25561 Pain in right knee: Secondary | ICD-10-CM | POA: Diagnosis not present

## 2021-08-29 DIAGNOSIS — L03115 Cellulitis of right lower limb: Secondary | ICD-10-CM

## 2021-08-29 DIAGNOSIS — S134XXA Sprain of ligaments of cervical spine, initial encounter: Secondary | ICD-10-CM | POA: Diagnosis not present

## 2021-08-29 MED ORDER — FAMOTIDINE 20 MG PO TABS: 20.0000 mg | ORAL_TABLET | Freq: Two times a day (BID) | ORAL | 0 refills | Status: DC

## 2021-08-29 MED ORDER — KETOROLAC TROMETHAMINE 60 MG/2ML IM SOLN
60.0000 mg | Freq: Once | INTRAMUSCULAR | Status: AC
  Administered 2021-08-29: 60 mg via INTRAMUSCULAR

## 2021-08-29 MED ORDER — SULFAMETHOXAZOLE-TRIMETHOPRIM 800-160 MG PO TABS: 1.0000 | ORAL_TABLET | Freq: Two times a day (BID) | ORAL | 0 refills | Status: DC

## 2021-08-29 MED ORDER — CYCLOBENZAPRINE HCL 10 MG PO TABS: 10.0000 mg | ORAL_TABLET | Freq: Every day | ORAL | 0 refills | Status: DC

## 2021-08-29 MED ORDER — NAPROXEN 500 MG PO TABS: 500.0000 mg | ORAL_TABLET | Freq: Two times a day (BID) | ORAL | 0 refills | Status: DC
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
## 2021-08-29 NOTE — Progress Notes (Signed)
Unable to contact patient to schedule echocardiogram, letter sent, order cancelled. 

## 2021-09-01 DIAGNOSIS — F0781 Postconcussional syndrome: Secondary | ICD-10-CM | POA: Diagnosis not present

## 2021-09-01 DIAGNOSIS — G43109 Migraine with aura, not intractable, without status migrainosus: Secondary | ICD-10-CM | POA: Diagnosis not present

## 2021-09-01 DIAGNOSIS — F411 Generalized anxiety disorder: Secondary | ICD-10-CM | POA: Diagnosis not present

## 2021-09-01 DIAGNOSIS — R569 Unspecified convulsions: Secondary | ICD-10-CM | POA: Diagnosis not present

## 2021-09-01 DIAGNOSIS — R42 Dizziness and giddiness: Secondary | ICD-10-CM | POA: Diagnosis not present

## 2021-09-30 ENCOUNTER — Ambulatory Visit: Payer: PPO | Admitting: Psychiatry

## 2021-10-29 ENCOUNTER — Ambulatory Visit (INDEPENDENT_AMBULATORY_CARE_PROVIDER_SITE_OTHER): Payer: PPO | Admitting: Psychiatry

## 2021-10-29 ENCOUNTER — Encounter: Payer: Self-pay | Admitting: Psychiatry

## 2021-10-29 VITALS — BP 111/73 | HR 114 | Ht <= 58 in | Wt 100.0 lb

## 2021-10-29 DIAGNOSIS — F4320 Adjustment disorder, unspecified: Secondary | ICD-10-CM | POA: Diagnosis not present

## 2021-10-29 DIAGNOSIS — Z9189 Other specified personal risk factors, not elsewhere classified: Secondary | ICD-10-CM | POA: Diagnosis not present

## 2021-10-29 DIAGNOSIS — F250 Schizoaffective disorder, bipolar type: Secondary | ICD-10-CM | POA: Diagnosis not present

## 2021-10-29 MED ORDER — BUSPIRONE HCL 15 MG PO TABS: 15.0000 mg | ORAL_TABLET | Freq: Two times a day (BID) | ORAL | 0 refills | Status: DC

## 2021-10-29 MED ORDER — QUETIAPINE FUMARATE 25 MG PO TABS: 75.0000 mg | ORAL_TABLET | Freq: Every day | ORAL | 1 refills | Status: DC
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
## 2021-10-29 NOTE — Patient Instructions (Addendum)
[email or web address]  [email or web address]  Oscar La - [phone]  Please call for EKG - [phone]

## 2021-12-01 ENCOUNTER — Encounter: Payer: Self-pay | Admitting: Family Medicine

## 2021-12-01 ENCOUNTER — Ambulatory Visit (INDEPENDENT_AMBULATORY_CARE_PROVIDER_SITE_OTHER): Payer: PPO | Admitting: Family Medicine

## 2021-12-01 VITALS — BP 92/61 | HR 96 | Temp 98.0°F | Wt 102.6 lb

## 2021-12-01 DIAGNOSIS — M25562 Pain in left knee: Secondary | ICD-10-CM

## 2021-12-01 DIAGNOSIS — S86912A Strain of unspecified muscle(s) and tendon(s) at lower leg level, left leg, initial encounter: Secondary | ICD-10-CM | POA: Diagnosis not present
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
## 2021-12-03 ENCOUNTER — Ambulatory Visit: Payer: PPO | Admitting: Psychiatry

## 2021-12-05 ENCOUNTER — Ambulatory Visit: Payer: PPO | Admitting: Family Medicine

## 2021-12-05 DIAGNOSIS — S86912D Strain of unspecified muscle(s) and tendon(s) at lower leg level, left leg, subsequent encounter: Secondary | ICD-10-CM | POA: Diagnosis not present

## 2021-12-15 DIAGNOSIS — M810 Age-related osteoporosis without current pathological fracture: Secondary | ICD-10-CM | POA: Diagnosis not present

## 2021-12-19 DIAGNOSIS — S86912D Strain of unspecified muscle(s) and tendon(s) at lower leg level, left leg, subsequent encounter: Secondary | ICD-10-CM | POA: Diagnosis not present

## 2021-12-30 DIAGNOSIS — S86912D Strain of unspecified muscle(s) and tendon(s) at lower leg level, left leg, subsequent encounter: Secondary | ICD-10-CM | POA: Diagnosis not present

## 2022-01-05 ENCOUNTER — Ambulatory Visit (INDEPENDENT_AMBULATORY_CARE_PROVIDER_SITE_OTHER): Payer: PPO | Admitting: *Deleted

## 2022-01-05 DIAGNOSIS — Z0000 Encounter for general adult medical examination without abnormal findings: Secondary | ICD-10-CM

## 2022-01-05 NOTE — Progress Notes (Signed)
Subjective:   Stacy Taylor is a 31 y.o. female who presents for Medicare Annual (Subsequent) preventive examination.  I connected with  Stacy Taylor on 01/05/22 by a telephone enabled telemedicine application and verified that I am speaking with the correct person using two identifiers.   I discussed the limitations of evaluation and management by telemedicine. The patient expressed understanding and agreed to proceed.  Patient location: home  Provider location:  Tele-Health-home    Review of Systems     Cardiac Risk Factors include: advanced age (>49men, >3 women)     Objective:    Today's Vitals   There is no height or weight on file to calculate BMI.     08/27/2021   10:41 AM 04/29/2021    9:00 AM 01/03/2021    1:48 PM 08/21/2020    9:03 AM 08/20/2020    3:40 PM 08/20/2020    3:30 PM  Advanced Directives  Does Patient Have a Medical Advance Directive? No No No No Unable to assess, patient is non-responsive or altered mental status;No No  Would patient like information on creating a medical advance directive? No - Patient declined No - Patient declined  No - Patient declined No - Patient declined     Current Medications (verified) Outpatient Encounter Medications as of 01/05/2022  Medication Sig   albuterol (VENTOLIN HFA) 108 (90 Base) MCG/ACT inhaler Inhale 2 puffs into the lungs every 4 (four) hours as needed for wheezing or shortness of breath.   budesonide-formoterol (SYMBICORT) 160-4.5 MCG/ACT inhaler Inhale 2 puffs into the lungs 2 (two) times daily.   busPIRone (BUSPAR) 15 MG tablet Take 1 tablet (15 mg total) by mouth 2 (two) times daily.   gabapentin (NEURONTIN) 400 MG capsule Take 1 capsule (400 mg total) by mouth 3 (three) times daily.   levonorgestrel (LILETTA, 52 MG,) 19.5 MCG/DAY IUD IUD 1 each by Intrauterine route once.   nortriptyline (PAMELOR) 50 MG capsule Take 50 mg by mouth at bedtime.   ondansetron (ZOFRAN ODT) 4 MG disintegrating tablet Take 1  tablet (4 mg total) by mouth every 8 (eight) hours as needed for nausea or vomiting.   QUEtiapine (SEROQUEL) 25 MG tablet Take 3 tablets (75 mg total) by mouth at bedtime.   rizatriptan (MAXALT-MLT) 10 MG disintegrating tablet Take by mouth.   Vitamin D, Ergocalciferol, (DRISDOL) 1.25 MG (50000 UNIT) CAPS capsule Take 1 capsule (50,000 Units total) by mouth once a week.   zoledronic acid (RECLAST) 5 MG/100ML SOLN injection Inject 5 mg into the vein once. Once yearly   No facility-administered encounter medications on file as of 01/05/2022.    Allergies (verified) Ibuprofen   History: Past Medical History:  Diagnosis Date   Anxiety    Asthma    Depression    Fetal alcohol syndrome    Migraines    Osteoporosis    Scoliosis    Seizures (HCC)    Past Surgical History:  Procedure Laterality Date   FRACTURE SURGERY     HAND SURGERY     TONSILECTOMY, ADENOIDECTOMY, BILATERAL MYRINGOTOMY AND TUBES     WRIST SURGERY     Family History  Problem Relation Age of Onset   Drug abuse Other    Mental illness Other    Social History   Socioeconomic History   Marital status: Married    Spouse name: Not on file   Number of children: Not on file   Years of education: Not on file   Highest education level: Not  on file  Occupational History   Not on file  Tobacco Use   Smoking status: Former    Types: Cigarettes   Smokeless tobacco: Never   Tobacco comments:    1 cig per day reported 03/14/2020  Vaping Use   Vaping Use: Some days  Substance and Sexual Activity   Alcohol use: Yes    Comment: socially   Drug use: Never   Sexual activity: Yes    Birth control/protection: I.U.D.  Other Topics Concern   Not on file  Social History Narrative   ** Merged History Encounter **       Social Determinants of Health   Financial Resource Strain: Low Risk  (01/05/2022)   Overall Financial Resource Strain (CARDIA)    Difficulty of Paying Living Expenses: Not hard at all  Food  Insecurity: No Food Insecurity (01/05/2022)   Hunger Vital Sign    Worried About Running Out of Food in the Last Year: Never true    Ran Out of Food in the Last Year: Never true  Transportation Needs: No Transportation Needs (01/05/2022)   PRAPARE - Administrator, Civil Service (Medical): No    Lack of Transportation (Non-Medical): No  Physical Activity: Sufficiently Active (01/03/2021)   Exercise Vital Sign    Days of Exercise per Week: 7 days    Minutes of Exercise per Session: 90 min  Stress: No Stress Concern Present (01/05/2022)   [employer] of Occupational Health - Occupational Stress Questionnaire    Feeling of Stress : Only a little  Social Connections: Moderately Isolated (01/05/2022)   Social Connection and Isolation Panel [NHANES]    Frequency of Communication with Friends and Family: More than three times a week    Frequency of Social Gatherings with Friends and Family: Three times a week    Attends Religious Services: Never    Active Member of Clubs or Organizations: No    Attends Banker Meetings: Never    Marital Status: Married    Tobacco Counseling Counseling given: Not Answered Tobacco comments: 1 cig per day reported 03/14/2020   Clinical Intake:  Pre-visit preparation completed: Yes  Pain : No/denies pain     Nutritional Risks: None Diabetes: No  How often do you need to have someone help you when you read instructions, pamphlets, or other written materials from your doctor or pharmacy?: 1 - Never  Diabetic?  no  Interpreter Needed?: No  Information entered by :: Remi Haggard LPN   Activities of Daily Living    01/05/2022    1:16 PM  In your present state of health, do you have any difficulty performing the following activities:  Hearing? 0  Vision? 0  Difficulty concentrating or making decisions? 0  Walking or climbing stairs? 0  Dressing or bathing? 0  Doing errands, shopping? 0  Preparing Food and eating ? N   Using the Toilet? N  In the past six months, have you accidently leaked urine? N  Do you have problems with loss of bowel control? N  Managing your Medications? N  Managing your Finances? N  Housekeeping or managing your Housekeeping? N    Patient Care Team: Dorcas Carrow, DO as PCP - General (Family Medicine) Lanier Prude, MD as PCP - Electrophysiology (Cardiology) Particia Nearing, PA-C (Family Medicine)  Indicate any recent Medical Services you may have received from other than [facility] providers in the past year (date may be approximate).     Assessment:  This is a routine wellness examination for Stacy Taylor.  Hearing/Vision screen Hearing Screening - Comments:: No trouble hearing Vision Screening - Comments:: [facility] Up to date  Dietary issues and exercise activities discussed: Current Exercise Habits: Home exercise routine;The patient has a physically strenuous job, but has no regular exercise apart from work., Type of exercise: walking, Time (Minutes): 30, Frequency (Times/Week): 5, Weekly Exercise (Minutes/Week): 150, Intensity: Moderate   Goals Addressed             This Visit's Progress    Patient Stated       Would like to get a promotion at work       Depression Screen    01/05/2022    1:11 PM 12/01/2021   11:04 AM 10/29/2021    8:40 AM 08/29/2021    9:31 AM 08/08/2021    9:09 AM 05/06/2021    2:20 PM 03/14/2021    9:09 AM  PHQ 2/9 Scores  PHQ - 2 Score 0 0  2  0 2  PHQ- 9 Score 4 2  11  1 13      Information is confidential and restricted. Go to Review Flowsheets to unlock data.    Fall Risk    01/05/2022    1:05 PM 01/03/2021    1:49 PM 02/16/2020   11:03 AM 10/12/2019    2:55 PM  Fall Risk   Falls in the past year? 0 0 0 0  Number falls in past yr: 0  0 0  Injury with Fall? 0  0 0  Risk for fall due to :  Medication side effect No Fall Risks   Follow up Falls evaluation completed;Education provided;Falls prevention discussed Falls  evaluation completed;Education provided;Falls prevention discussed Falls evaluation completed     FALL RISK PREVENTION PERTAINING TO THE HOME:  Any stairs in or around the home? Yes  If so, are there any without handrails? No  Home free of loose throw rugs in walkways, pet beds, electrical cords, etc? Yes  Adequate lighting in your home to reduce risk of falls? Yes   ASSISTIVE DEVICES UTILIZED TO PREVENT FALLS:  Life alert? No  Use of a cane, walker or w/c? No  Grab bars in the bathroom? No  Shower chair or bench in shower? No  Elevated toilet seat or a handicapped toilet? No   TIMED UP AND GO:  Was the test performed? No .    Cognitive Function:        01/05/2022    1:07 PM 01/03/2021    1:51 PM  6CIT Screen  What Year? 0 points 0 points  What month? 0 points 0 points  What time? 0 points 0 points  Count back from 20 0 points 0 points  Months in reverse 2 points 0 points  Repeat phrase 0 points 4 points  Total Score 2 points 4 points    Immunizations Immunization History  Administered Date(s) Administered   Influenza,inj,Quad PF,6+ Mos 02/14/2021   Influenza-Unspecified 02/14/2020   PFIZER(Purple Top)SARS-COV-2 Vaccination 09/03/2019, 09/24/2019   Pneumococcal Polysaccharide-23 01/26/2020   Tdap 08/17/2020    TDAP status: Up to date  Flu Vaccine status: Up to date  Pneumococcal vaccine status: Up to date  Covid-19 vaccine status: Information provided on how to obtain vaccines.   Qualifies for Shingles Vaccine? No   Zostavax completed No   Shingrix Completed?: No.    Education has been provided regarding the importance of this vaccine. Patient has been advised to call insurance company to  determine out of pocket expense if they have not yet received this vaccine. Advised may also receive vaccine at local pharmacy or Health Dept. Verbalized acceptance and understanding.  Screening Tests Health Maintenance  Topic Date Due   COVID-19 Vaccine (3 - Pfizer risk  series) 10/22/2019   INFLUENZA VACCINE  11/25/2021   PAP SMEAR-Modifier  03/14/2024   TETANUS/TDAP  08/18/2030   Hepatitis C Screening  Completed   HIV Screening  Completed   HPV VACCINES  Aged Out    Health Maintenance  Health Maintenance Due  Topic Date Due   COVID-19 Vaccine (3 - Pfizer risk series) 10/22/2019   INFLUENZA VACCINE  11/25/2021     Colonoscopy not required  Mammogram not required  Bone Density status: Completed 2023. Results reflect: Bone density results: OSTEOPOROSIS. Repeat every 2 years.  Lung Cancer Screening: (Low Dose CT Chest recommended if Age 86-80 years, 30 pack-year currently smoking OR have quit w/in 15years.) does not qualify.   Lung Cancer Screening Referral:   Additional Screening:  Hepatitis C Screening: does not qualify;   Vision Screening: Recommended annual ophthalmology exams for early detection of glaucoma and other disorders of the eye. Is the patient up to date with their annual eye exam?  Yes  Who is the provider or what is the name of the office in which the patient attends annual eye exams? [facility] If pt is not established with a provider, would they like to be referred to a provider to establish care? No .   Dental Screening: Recommended annual dental exams for proper oral hygiene  Community Resource Referral / Chronic Care Management: CRR required this visit?  No   CCM required this visit?  No      Plan:     I have personally reviewed and noted the following in the patient's chart:   Medical and social history Use of alcohol, tobacco or illicit drugs  Current medications and supplements including opioid prescriptions. Patient is not currently taking opioid prescriptions. Functional ability and status Nutritional status Physical activity Advanced directives List of other physicians Hospitalizations, surgeries, and ER visits in previous 12 months Vitals Screenings to include cognitive, depression, and  falls Referrals and appointments  In addition, I have reviewed and discussed with patient certain preventive protocols, quality metrics, and best practice recommendations. A written personalized care plan for preventive services as well as general preventive health recommendations were provided to patient.     Remi Haggard, LPN   05/02/2692   Nurse Notes:

## 2022-01-05 NOTE — Patient Instructions (Signed)
Stacy Taylor , Thank you for taking time to come for your Medicare Wellness Visit. I appreciate your ongoing commitment to your health goals. Please review the following plan we discussed and let me know if I can assist you in the future.   Screening recommendations/referrals: Colonoscopy: not indicated Mammogram: not indicated Bone Density: up to date Recommended yearly ophthalmology/optometry visit for glaucoma screening and checkup Recommended yearly dental visit for hygiene and checkup  Vaccinations: Influenza vaccine: up to date Pneumococcal vaccine: up to date Tdap vaccine: up to date Shingles vaccine: not indicated    Advanced directives: Education provided  Conditions/risks identified:   Next appointment: 03-16-2022 @ 11:00  [facility] 33 Years and Older, Female Preventive care refers to lifestyle choices and visits with your health care provider that can promote health and wellness. What does preventive care include? A yearly physical exam. This is also called an annual well check. Dental exams once or twice a year. Routine eye exams. Ask your health care provider how often you should have your eyes checked. Personal lifestyle choices, including: Daily care of your teeth and gums. Regular physical activity. Eating a healthy diet. Avoiding tobacco and drug use. Limiting alcohol use. Practicing safe sex. Taking low-dose aspirin every day. Taking vitamin and mineral supplements as recommended by your health care provider. What happens during an annual well check? The services and screenings done by your health care provider during your annual well check will depend on your age, overall health, lifestyle risk factors, and family history of disease. Counseling  Your health care provider may ask you questions about your: Alcohol use. Tobacco use. Drug use. Emotional well-being. Home and relationship well-being. Sexual activity. Eating habits. History  of falls. Memory and ability to understand (cognition). Work and work Astronomer. Reproductive health. Screening  You may have the following tests or measurements: Height, weight, and BMI. Blood pressure. Lipid and cholesterol levels. These may be checked every 5 years, or more frequently if you are over 60 years old. Skin check. Lung cancer screening. You may have this screening every year starting at age 75 if you have a 30-pack-year history of smoking and currently smoke or have quit within the past 15 years. Fecal occult blood test (FOBT) of the stool. You may have this test every year starting at age 59. Flexible sigmoidoscopy or colonoscopy. You may have a sigmoidoscopy every 5 years or a colonoscopy every 10 years starting at age 18. Hepatitis C blood test. Hepatitis B blood test. Sexually transmitted disease (STD) testing. Diabetes screening. This is done by checking your blood sugar (glucose) after you have not eaten for a while (fasting). You may have this done every 1-3 years. Bone density scan. This is done to screen for osteoporosis. You may have this done starting at age 103. Mammogram. This may be done every 1-2 years. Talk to your health care provider about how often you should have regular mammograms. Talk with your health care provider about your test results, treatment options, and if necessary, the need for more tests. Vaccines  Your health care provider may recommend certain vaccines, such as: Influenza vaccine. This is recommended every year. Tetanus, diphtheria, and acellular pertussis (Tdap, Td) vaccine. You may need a Td booster every 10 years. Zoster vaccine. You may need this after age 63. Pneumococcal 13-valent conjugate (PCV13) vaccine. One dose is recommended after age 21. Pneumococcal polysaccharide (PPSV23) vaccine. One dose is recommended after age 56. Talk to your health care provider about  which screenings and vaccines you need and how often you need  them. This information is not intended to replace advice given to you by your health care provider. Make sure you discuss any questions you have with your health care provider. Document Released: 05/10/2015 Document Revised: 01/01/2016 Document Reviewed: 02/12/2015 Elsevier Interactive Patient Education  2017 [employer] Prevention in the Home Falls can cause injuries. They can happen to people of all ages. There are many things you can do to make your home safe and to help prevent falls. What can I do on the outside of my home? Regularly fix the edges of walkways and driveways and fix any cracks. Remove anything that might make you trip as you walk through a door, such as a raised step or threshold. Trim any bushes or trees on the path to your home. Use bright outdoor lighting. Clear any walking paths of anything that might make someone trip, such as rocks or tools. Regularly check to see if handrails are loose or broken. Make sure that both sides of any steps have handrails. Any raised decks and porches should have guardrails on the edges. Have any leaves, snow, or ice cleared regularly. Use sand or salt on walking paths during winter. Clean up any spills in your garage right away. This includes oil or grease spills. What can I do in the bathroom? Use night lights. Install grab bars by the toilet and in the tub and shower. Do not use towel bars as grab bars. Use non-skid mats or decals in the tub or shower. If you need to sit down in the shower, use a plastic, non-slip stool. Keep the floor dry. Clean up any water that spills on the floor as soon as it happens. Remove soap buildup in the tub or shower regularly. Attach bath mats securely with double-sided non-slip rug tape. Do not have throw rugs and other things on the floor that can make you trip. What can I do in the bedroom? Use night lights. Make sure that you have a light by your bed that is easy to reach. Do not use  any sheets or blankets that are too big for your bed. They should not hang down onto the floor. Have a firm chair that has side arms. You can use this for support while you get dressed. Do not have throw rugs and other things on the floor that can make you trip. What can I do in the kitchen? Clean up any spills right away. Avoid walking on wet floors. Keep items that you use a lot in easy-to-reach places. If you need to reach something above you, use a strong step stool that has a grab bar. Keep electrical cords out of the way. Do not use floor polish or wax that makes floors slippery. If you must use wax, use non-skid floor wax. Do not have throw rugs and other things on the floor that can make you trip. What can I do with my stairs? Do not leave any items on the stairs. Make sure that there are handrails on both sides of the stairs and use them. Fix handrails that are broken or loose. Make sure that handrails are as long as the stairways. Check any carpeting to make sure that it is firmly attached to the stairs. Fix any carpet that is loose or worn. Avoid having throw rugs at the top or bottom of the stairs. If you do have throw rugs, attach them to the floor with  carpet tape. Make sure that you have a light switch at the top of the stairs and the bottom of the stairs. If you do not have them, ask someone to add them for you. What else can I do to help prevent falls? Wear shoes that: Do not have high heels. Have rubber bottoms. Are comfortable and fit you well. Are closed at the toe. Do not wear sandals. If you use a stepladder: Make sure that it is fully opened. Do not climb a closed stepladder. Make sure that both sides of the stepladder are locked into place. Ask someone to hold it for you, if possible. Clearly mark and make sure that you can see: Any grab bars or handrails. First and last steps. Where the edge of each step is. Use tools that help you move around (mobility aids)  if they are needed. These include: Canes. Walkers. Scooters. Crutches. Turn on the lights when you go into a dark area. Replace any light bulbs as soon as they burn out. Set up your furniture so you have a clear path. Avoid moving your furniture around. If any of your floors are uneven, fix them. If there are any pets around you, be aware of where they are. Review your medicines with your doctor. Some medicines can make you feel dizzy. This can increase your chance of falling. Ask your doctor what other things that you can do to help prevent falls. This information is not intended to replace advice given to you by your health care provider. Make sure you discuss any questions you have with your health care provider. Document Released: 02/07/2009 Document Revised: 09/19/2015 Document Reviewed: 05/18/2014 Elsevier Interactive Patient Education  2017 [employer].

## 2022-01-07 ENCOUNTER — Ambulatory Visit: Payer: PPO | Admitting: Psychiatry

## 2022-01-21 ENCOUNTER — Ambulatory Visit: Payer: PPO | Admitting: Psychiatry

## 2022-03-04 DIAGNOSIS — G43109 Migraine with aura, not intractable, without status migrainosus: Secondary | ICD-10-CM | POA: Diagnosis not present

## 2022-03-04 DIAGNOSIS — F411 Generalized anxiety disorder: Secondary | ICD-10-CM | POA: Diagnosis not present

## 2022-03-04 DIAGNOSIS — R42 Dizziness and giddiness: Secondary | ICD-10-CM | POA: Diagnosis not present

## 2022-03-04 DIAGNOSIS — R569 Unspecified convulsions: Secondary | ICD-10-CM | POA: Diagnosis not present

## 2022-03-04 DIAGNOSIS — H53149 Visual discomfort, unspecified: Secondary | ICD-10-CM | POA: Diagnosis not present

## 2022-03-16 ENCOUNTER — Encounter: Payer: PPO | Admitting: Family Medicine

## 2022-03-18 ENCOUNTER — Ambulatory Visit: Payer: PPO | Admitting: Psychiatry

## 2022-03-27 ENCOUNTER — Encounter: Payer: Self-pay | Admitting: Family Medicine

## 2022-03-27 ENCOUNTER — Ambulatory Visit (INDEPENDENT_AMBULATORY_CARE_PROVIDER_SITE_OTHER): Payer: PPO | Admitting: Family Medicine

## 2022-03-27 VITALS — BP 103/66 | HR 102 | Temp 97.8°F | Ht <= 58 in | Wt 98.2 lb

## 2022-03-27 DIAGNOSIS — J4540 Moderate persistent asthma, uncomplicated: Secondary | ICD-10-CM | POA: Diagnosis not present

## 2022-03-27 DIAGNOSIS — F419 Anxiety disorder, unspecified: Secondary | ICD-10-CM | POA: Diagnosis not present

## 2022-03-27 DIAGNOSIS — Z136 Encounter for screening for cardiovascular disorders: Secondary | ICD-10-CM | POA: Diagnosis not present

## 2022-03-27 DIAGNOSIS — R42 Dizziness and giddiness: Secondary | ICD-10-CM | POA: Diagnosis not present

## 2022-03-27 DIAGNOSIS — Z0000 Encounter for general adult medical examination without abnormal findings: Secondary | ICD-10-CM

## 2022-03-27 DIAGNOSIS — Z23 Encounter for immunization: Secondary | ICD-10-CM

## 2022-03-27 DIAGNOSIS — G43009 Migraine without aura, not intractable, without status migrainosus: Secondary | ICD-10-CM

## 2022-03-27 DIAGNOSIS — E559 Vitamin D deficiency, unspecified: Secondary | ICD-10-CM

## 2022-03-27 DIAGNOSIS — F17200 Nicotine dependence, unspecified, uncomplicated: Secondary | ICD-10-CM | POA: Diagnosis not present

## 2022-03-27 LAB — URINALYSIS, ROUTINE W REFLEX MICROSCOPIC
Bilirubin, UA: NEGATIVE
Glucose, UA: NEGATIVE
Ketones, UA: NEGATIVE
Nitrite, UA: NEGATIVE
Protein,UA: NEGATIVE
Specific Gravity, UA: 1.025 (ref 1.005–1.030)
Urobilinogen, Ur: 1 mg/dL (ref 0.2–1.0)
pH, UA: 5.5 (ref 5.0–7.5)

## 2022-03-27 LAB — MICROSCOPIC EXAMINATION: Bacteria, UA: NONE SEEN

## 2022-03-27 MED ORDER — ONDANSETRON 4 MG PO TBDP: 4.0000 mg | ORAL_TABLET | Freq: Three times a day (TID) | ORAL | 12 refills | Status: DC | PRN

## 2022-03-27 MED ORDER — ALBUTEROL SULFATE HFA 108 (90 BASE) MCG/ACT IN AERS: 2.0000 | INHALATION_SPRAY | RESPIRATORY_TRACT | 12 refills | Status: DC | PRN

## 2022-03-27 MED ORDER — BUDESONIDE-FORMOTEROL FUMARATE 160-4.5 MCG/ACT IN AERO
2.0000 | INHALATION_SPRAY | Freq: Two times a day (BID) | RESPIRATORY_TRACT | 12 refills | Status: DC
Start: 2022-03-27 — End: 2023-11-24

## 2022-03-27 NOTE — Progress Notes (Incomplete)
BP 103/66   Pulse (!) 102   Temp 97.8 F (36.6 C) (Oral)   Ht 4' 9.25" (1.454 m)   Wt 98 lb 3.2 oz (44.5 kg)   SpO2 99%   BMI 21.07 kg/m    Subjective:    Patient ID: Stacy Taylor, female    DOB: [birth year], 31 y.o.   MRN: [id number]  HPI: Stacy Taylor is a 31 y.o. female presenting on 03/27/2022 for comprehensive medical examination. Current medical complaints include:  Migraines have been stable. Follows with neurology.  ANXIETY/STRESS Duration: chronic Status:controlled Anxious mood: no  Excessive worrying: no Irritability: no  Sweating: no Nausea: no Palpitations:no Hyperventilation: no Panic attacks: no Agoraphobia: no  Obscessions/compulsions: no Depressed mood: no    03/27/2022    1:53 PM 01/05/2022    1:11 PM 12/01/2021   11:04 AM 10/29/2021    8:40 AM 08/29/2021    9:31 AM  Depression screen PHQ 2/9  Decreased Interest 0 0 0  1  Down, Depressed, Hopeless 0 0 0  1  PHQ - 2 Score 0 0 0  2  Altered sleeping 0 3 0  3  Tired, decreased energy 0 0 0  2  Change in appetite 0 0 0  1  Feeling bad or failure about yourself  0 0 0  2  Trouble concentrating 0 1 0  0  Moving slowly or fidgety/restless 0 0 2  1  Suicidal thoughts 0 0 0  0  PHQ-9 Score 0 _0 Difficult doing work/chores Not difficult at all Not difficult at all Not difficult at all       Information is confidential and restricted. Go to Review Flowsheets to unlock data.   Anhedonia: no Weight changes: no Insomnia: no   Hypersomnia: no Fatigue/loss of energy: no Feelings of worthlessness: no Feelings of guilt: no Impaired concentration/indecisiveness: no Suicidal ideations: no  Crying spells: no Recent Stressors/Life Changes: no   Relationship problems: no   Family stress: no     Financial stress: no    Job stress: no    Recent death/loss: no  ASTHMA Asthma status: controlled Satisfied with current treatment?: yes Albuterol/rescue inhaler frequency: occasionally Dyspnea  frequency: rarely Wheezing frequency: rarely Cough frequency: never Nocturnal symptom frequency: never  Limitation of activity: no Current upper respiratory symptoms: no Aerochamber/spacer use: no Visits to ER or Urgent Care in past year: no Pneumovax: Up to Date Influenza: Up to Date  She currently lives with: husband Menopausal Symptoms: no  Depression Screen done today and results listed below:     03/27/2022    1:53 PM 01/05/2022    1:11 PM 12/01/2021   11:04 AM 10/29/2021    8:40 AM 08/29/2021    9:31 AM  Depression screen PHQ 2/9  Decreased Interest 0 0 0  1  Down, Depressed, Hopeless 0 0 0  1  PHQ - 2 Score 0 0 0  2  Altered sleeping 0 3 0  3  Tired, decreased energy 0 0 0  2  Change in appetite 0 0 0  1  Feeling bad or failure about yourself  0 0 0  2  Trouble concentrating 0 1 0  0  Moving slowly or fidgety/restless 0 0 2  1  Suicidal thoughts 0 0 0  0  PHQ-9 Score 0 _1 Difficult doing work/chores Not difficult at all Not difficult at all Not difficult at all  Information is confidential and restricted. Go to Review Flowsheets to unlock data.    Past Medical History:  Past Medical History:  Diagnosis Date   Anxiety    Asthma    Depression    Fetal alcohol syndrome    Migraines    Osteoporosis    Scoliosis    Seizures ([place])     Surgical History:  Past Surgical History:  Procedure Laterality Date   FRACTURE SURGERY     HAND SURGERY     TONSILECTOMY, ADENOIDECTOMY, BILATERAL MYRINGOTOMY AND TUBES     WRIST SURGERY      Medications:  Current Outpatient Medications on File Prior to Visit  Medication Sig   busPIRone (BUSPAR) 15 MG tablet Take 1 tablet (15 mg total) by mouth 2 (two) times daily.   gabapentin (NEURONTIN) 400 MG capsule Take 1 capsule (400 mg total) by mouth 3 (three) times daily.   levonorgestrel (LILETTA, 52 MG,) 19.5 MCG/DAY IUD IUD 1 each by Intrauterine route once.   nortriptyline (PAMELOR) 50 MG capsule Take 50 mg by mouth  at bedtime.   QUEtiapine (SEROQUEL) 25 MG tablet Take 3 tablets (75 mg total) by mouth at bedtime.   rizatriptan (MAXALT-MLT) 10 MG disintegrating tablet Take by mouth.   Vitamin D, Ergocalciferol, (DRISDOL) 1.25 MG (50000 UNIT) CAPS capsule Take 1 capsule (50,000 Units total) by mouth once a week.   zoledronic acid (RECLAST) 5 MG/100ML SOLN injection Inject 5 mg into the vein once. Once yearly   No current facility-administered medications on file prior to visit.    Allergies:  Allergies  Allergen Reactions   Ibuprofen     Abdominal pain, upset stomach    Social History:  Social History   Socioeconomic History   Marital status: Married    Spouse name: Not on file   Number of children: Not on file   Years of education: Not on file   Highest education level: Not on file  Occupational History   Not on file  Tobacco Use   Smoking status: Former    Types: Cigarettes   Smokeless tobacco: Never   Tobacco comments:    1 cig per day reported 03/14/2020  Vaping Use   Vaping Use: Some days  Substance and Sexual Activity   Alcohol use: Yes    Comment: socially   Drug use: Never   Sexual activity: Yes    Birth control/protection: I.U.D.  Other Topics Concern   Not on file  Social History Narrative   ** Merged History Encounter **       Social Determinants of Health   Financial Resource Strain: Low Risk  (01/05/2022)   Overall Financial Resource Strain (CARDIA)    Difficulty of Paying Living Expenses: Not hard at all  Food Insecurity: No Food Insecurity (01/05/2022)   Hunger Vital Sign    Worried About Running Out of Food in the Last Year: Never true    Ran Out of Food in the Last Year: Never true  Transportation Needs: No Transportation Needs (01/05/2022)   PRAPARE - Hydrologist (Medical): No    Lack of Transportation (Non-Medical): No  Physical Activity: Sufficiently Active (01/03/2021)   Exercise Vital Sign    Days of Exercise per Week: 7  days    Minutes of Exercise per Session: 90 min  Stress: No Stress Concern Present (01/05/2022)   [employer]    Feeling of Stress : Only a little  Social Connections: Moderately Isolated (01/05/2022)   Social Connection and Isolation Panel [NHANES]    Frequency of Communication with Friends and Family: More than three times a week    Frequency of Social Gatherings with Friends and Family: Three times a week    Attends Religious Services: Never    Active Member of Clubs or Organizations: No    Attends Archivist Meetings: Never    Marital Status: Married  Human resources officer Violence: Not At Risk (01/05/2022)   Humiliation, Afraid, Rape, and Kick questionnaire    Fear of Current or Ex-Partner: No    Emotionally Abused: No    Physically Abused: No    Sexually Abused: No   Social History   Tobacco Use  Smoking Status Former   Types: Cigarettes  Smokeless Tobacco Never  Tobacco Comments   1 cig per day reported 03/14/2020   Social History   Substance and Sexual Activity  Alcohol Use Yes   Comment: socially    Family History:  Family History  Problem Relation Age of Onset   Drug abuse Other    Mental illness Other     Past medical history, surgical history, medications, allergies, family history and social history reviewed with patient today and changes made to appropriate areas of the chart.   Review of Systems  Constitutional: Negative.   HENT: Negative.    Eyes: Negative.   Respiratory: Negative.    Cardiovascular: Negative.   Gastrointestinal:  Positive for constipation. Negative for abdominal pain, blood in stool, diarrhea, heartburn, melena, nausea and vomiting.  Genitourinary: Negative.   Musculoskeletal: Negative.   Skin: Negative.   Neurological: Negative.   Endo/Heme/Allergies: Negative.   Psychiatric/Behavioral: Negative.     All other ROS negative except what is listed above and in  the HPI.      Objective:    BP 103/66   Pulse (!) 102   Temp 97.8 F (36.6 C) (Oral)   Ht 4' 9.25" (1.454 m)   Wt 98 lb 3.2 oz (44.5 kg)   SpO2 99%   BMI 21.07 kg/m   Wt Readings from Last 3 Encounters:  03/27/22 98 lb 3.2 oz (44.5 kg)  12/01/21 102 lb 9.6 oz (46.5 kg)  08/29/21 97 lb 9.6 oz (44.3 kg)    Physical Exam Vitals and nursing note reviewed.  Constitutional:      General: She is not in acute distress.    Appearance: Normal appearance. She is normal weight. She is not ill-appearing, toxic-appearing or diaphoretic.  HENT:     Head: Normocephalic and atraumatic.     Right Ear: Tympanic membrane, ear canal and external ear normal. There is no impacted cerumen.     Left Ear: Tympanic membrane, ear canal and external ear normal. There is no impacted cerumen.     Nose: Nose normal. No congestion or rhinorrhea.     Mouth/Throat:     Mouth: Mucous membranes are moist.     Pharynx: Oropharynx is clear. No oropharyngeal exudate or posterior oropharyngeal erythema.  Eyes:     General: No scleral icterus.       Right eye: No discharge.        Left eye: No discharge.     Extraocular Movements: Extraocular movements intact.     Conjunctiva/sclera: Conjunctivae normal.     Pupils: Pupils are equal, round, and reactive to light.  Neck:     Vascular: No carotid bruit.  Cardiovascular:     Rate and Rhythm: Normal rate  and regular rhythm.     Pulses: Normal pulses.     Heart sounds: No murmur heard.    No friction rub. No gallop.  Pulmonary:     Effort: Pulmonary effort is normal. No respiratory distress.     Breath sounds: Normal breath sounds. No stridor. No wheezing, rhonchi or rales.  Chest:     Chest wall: No tenderness.  Abdominal:     General: Abdomen is flat. Bowel sounds are normal. There is no distension.     Palpations: Abdomen is soft. There is no mass.     Tenderness: There is no abdominal tenderness. There is no right CVA tenderness, left CVA tenderness,  guarding or rebound.     Hernia: No hernia is present.  Genitourinary:    Comments: Breast and pelvic exams deferred with shared decision making Musculoskeletal:        General: No swelling, tenderness, deformity or signs of injury.     Cervical back: Normal range of motion and neck supple. No rigidity. No muscular tenderness.     Right lower leg: No edema.     Left lower leg: No edema.  Lymphadenopathy:     Cervical: No cervical adenopathy.  Skin:    General: Skin is warm and dry.     Capillary Refill: Capillary refill takes less than 2 seconds.     Coloration: Skin is not jaundiced or pale.     Findings: No bruising, erythema, lesion or rash.  Neurological:     General: No focal deficit present.     Mental Status: She is alert and oriented to person, place, and time. Mental status is at baseline.     Cranial Nerves: No cranial nerve deficit.     Sensory: No sensory deficit.     Motor: No weakness.     Coordination: Coordination normal.     Gait: Gait normal.     Deep Tendon Reflexes: Reflexes normal.  Psychiatric:        Mood and Affect: Mood normal.        Behavior: Behavior normal.        Thought Content: Thought content normal.        Judgment: Judgment normal.     Results for orders placed or performed in visit on 03/27/22  Microscopic Examination   Urine  Result Value Ref Range   WBC, UA 0-5 0 - 5 /hpf   RBC, Urine 0-2 0 - 2 /hpf   Epithelial Cells (non renal) 0-10 0 - 10 /hpf   Bacteria, UA None seen None seen/Few  CBC with Differential/Platelet  Result Value Ref Range   WBC 8.2 3.4 - 10.8 x10E3/uL   RBC 4.03 3.77 - 5.28 x10E6/uL   Hemoglobin 13.5 11.1 - 15.9 g/dL   Hematocrit 38.2 34.0 - 46.6 %   MCV 95 79 - 97 fL   MCH 33.5 (H) 26.6 - 33.0 pg   MCHC 35.3 31.5 - 35.7 g/dL   RDW 12.2 11.7 - 15.4 %   Platelets 144 (L) 150 - 450 x10E3/uL   Neutrophils 70 Not Estab. %   Lymphs 24 Not Estab. %   Monocytes 4 Not Estab. %   Eos 1 Not Estab. %   Basos 1 Not  Estab. %   Neutrophils Absolute 5.8 1.4 - 7.0 x10E3/uL   Lymphocytes Absolute 2.0 0.7 - 3.1 x10E3/uL   Monocytes Absolute 0.3 0.1 - 0.9 x10E3/uL   EOS (ABSOLUTE) 0.1 0.0 - 0.4 x10E3/uL   Basophils Absolute  0.1 0.0 - 0.2 x10E3/uL   Immature Granulocytes 0 Not Estab. %   Immature Grans (Abs) 0.0 0.0 - 0.1 x10E3/uL  Comprehensive metabolic panel  Result Value Ref Range   Glucose 82 70 - 99 mg/dL   BUN 14 6 - 20 mg/dL   Creatinine, Ser 0.82 0.57 - 1.00 mg/dL   eGFR 97 >59 mL/min/1.73   BUN/Creatinine Ratio 17 9 - 23   Sodium 139 134 - 144 mmol/L   Potassium 3.6 3.5 - 5.2 mmol/L   Chloride 103 96 - 106 mmol/L   CO2 22 20 - 29 mmol/L   Calcium 8.6 (L) 8.7 - 10.2 mg/dL   Total Protein 6.4 6.0 - 8.5 g/dL   Albumin 4.3 3.9 - 4.9 g/dL   Globulin, Total 2.1 1.5 - 4.5 g/dL   Albumin/Globulin Ratio 2.0 1.2 - 2.2   Bilirubin Total 0.7 0.0 - 1.2 mg/dL   Alkaline Phosphatase 44 44 - 121 IU/L   AST 12 0 - 40 IU/L   ALT 10 0 - 32 IU/L  Lipid Panel w/o Chol/HDL Ratio  Result Value Ref Range   Cholesterol, Total 150 100 - 199 mg/dL   Triglycerides 36 0 - 149 mg/dL   HDL 64 >39 mg/dL   VLDL Cholesterol Cal 9 5 - 40 mg/dL   LDL Chol Calc (NIH) 77 0 - 99 mg/dL  Urinalysis, Routine w reflex microscopic  Result Value Ref Range   Specific Gravity, UA 1.025 1.005 - 1.030   pH, UA 5.5 5.0 - 7.5   Color, UA Yellow Yellow   Appearance Ur Cloudy (A) Clear   Leukocytes,UA Trace (A) Negative   Protein,UA Negative Negative/Trace   Glucose, UA Negative Negative   Ketones, UA Negative Negative   RBC, UA 2+ (A) Negative   Bilirubin, UA Negative Negative   Urobilinogen, Ur 1.0 0.2 - 1.0 mg/dL   Nitrite, UA Negative Negative   Microscopic Examination See below:   TSH  Result Value Ref Range   TSH 0.830 0.450 - 4.500 uIU/mL  VITAMIN D 25 Hydroxy (Vit-D Deficiency, Fractures)  Result Value Ref Range   Vit D, 25-Hydroxy 14.3 (L) 30.0 - 100.0 ng/mL      Assessment & Plan:   Problem List Items  Addressed This Visit       Cardiovascular and Mediastinum   Migraines    Stable. Continue to monitor with neurology. Call with any concerns.          Respiratory   Asthma    Under good control on current regimen. Continue current regimen. Continue to monitor. Call with any concerns. Refills given.        Relevant Medications   albuterol (VENTOLIN HFA) 108 (90 Base) MCG/ACT inhaler   budesonide-formoterol (SYMBICORT) 160-4.5 MCG/ACT inhaler   Other Relevant Orders   CBC with Differential/Platelet (Completed)   Comprehensive metabolic panel (Completed)     Other   Anxiety disorder    Under good control on current regimen. Continue current regimen. Continue to monitor. Call with any concerns. Refills given.        Relevant Orders   Comprehensive metabolic panel (Completed)   TSH (Completed)   Tobacco use disorder    Not interested in quitting at this time. UA checked today.       Relevant Orders   CBC with Differential/Platelet (Completed)   Comprehensive metabolic panel (Completed)   Urinalysis, Routine w reflex microscopic (Completed)   Microscopic Examination (Completed)   Dizziness    Checking labs  today. Await results.       Relevant Orders   Comprehensive metabolic panel (Completed)   TSH (Completed)   Vitamin D deficiency    Checking labs today. Await results. Treat as needed.       Relevant Orders   VITAMIN D 25 Hydroxy (Vit-D Deficiency, Fractures) (Completed)   Other Visit Diagnoses     Routine general medical examination at a health care facility    -  Primary   Vaccines up to date. Screening labs checked today. Pap up to date. Continue diet and exercise. Call with any concerns.   Screening for cardiovascular condition       Labs drawn today. Await results.   Relevant Orders   Lipid Panel w/o Chol/HDL Ratio (Completed)   Flu vaccine need       Flu shot given today.   Relevant Orders   Flu Vaccine QUAD 6+ mos PF IM (Fluarix Quad PF) (Completed)         Follow up plan: Return in about 1 year (around 03/28/2023) for physical.   LABORATORY TESTING:  - Pap smear: up to date  IMMUNIZATIONS:   - Tdap: Tetanus vaccination status reviewed: last tetanus booster within 10 years. - Influenza: Administered today - Pneumovax: Up to date - Prevnar: Not applicable - COVID: Up to date - HPV: Up to date  PATIENT COUNSELING:   Advised to take 1 mg of folate supplement per day if capable of pregnancy.   Sexuality: Discussed sexually transmitted diseases, partner selection, use of condoms, avoidance of unintended pregnancy  and contraceptive alternatives.   Advised to avoid cigarette smoking.  I discussed with the patient that most people either abstain from alcohol or drink within safe limits (<=14/week and <=4 drinks/occasion for males, <=7/weeks and <= 3 drinks/occasion for females) and that the risk for alcohol disorders and other health effects rises proportionally with the number of drinks per week and how often a drinker exceeds daily limits.  Discussed cessation/primary prevention of drug use and availability of treatment for abuse.   Diet: Encouraged to adjust caloric intake to maintain  or achieve ideal body weight, to reduce intake of dietary saturated fat and total fat, to limit sodium intake by avoiding high sodium foods and not adding table salt, and to maintain adequate dietary potassium and calcium preferably from fresh fruits, vegetables, and low-fat dairy products.    stressed the importance of regular exercise  Injury prevention: Discussed safety belts, safety helmets, smoke detector, smoking near bedding or upholstery.   Dental health: Discussed importance of regular tooth brushing, flossing, and dental visits.    NEXT PREVENTATIVE PHYSICAL DUE IN 1 YEAR. Return in about 1 year (around 03/28/2023) for physical.

## 2022-03-28 LAB — COMPREHENSIVE METABOLIC PANEL
ALT: 10 IU/L (ref 0–32)
AST: 12 IU/L (ref 0–40)
Albumin/Globulin Ratio: 2 (ref 1.2–2.2)
Albumin: 4.3 g/dL (ref 3.9–4.9)
Alkaline Phosphatase: 44 IU/L (ref 44–121)
BUN/Creatinine Ratio: 17 (ref 9–23)
BUN: 14 mg/dL (ref 6–20)
Bilirubin Total: 0.7 mg/dL (ref 0.0–1.2)
CO2: 22 mmol/L (ref 20–29)
Calcium: 8.6 mg/dL — ABNORMAL LOW (ref 8.7–10.2)
Chloride: 103 mmol/L (ref 96–106)
Creatinine, Ser: 0.82 mg/dL (ref 0.57–1.00)
Globulin, Total: 2.1 g/dL (ref 1.5–4.5)
Glucose: 82 mg/dL (ref 70–99)
Potassium: 3.6 mmol/L (ref 3.5–5.2)
Sodium: 139 mmol/L (ref 134–144)
Total Protein: 6.4 g/dL (ref 6.0–8.5)
eGFR: 97 mL/min/{1.73_m2} (ref >59)

## 2022-03-28 LAB — LIPID PANEL W/O CHOL/HDL RATIO
Cholesterol, Total: 150 mg/dL (ref 100–199)
HDL: 64 mg/dL (ref >39)
LDL Chol Calc (NIH): 77 mg/dL (ref 0–99)
Triglycerides: 36 mg/dL (ref 0–149)
VLDL Cholesterol Cal: 9 mg/dL (ref 5–40)

## 2022-03-28 LAB — CBC WITH DIFFERENTIAL/PLATELET
Basophils Absolute: 0.1 10*3/uL (ref 0.0–0.2)
Basos: 1 %
EOS (ABSOLUTE): 0.1 10*3/uL (ref 0.0–0.4)
Eos: 1 %
Hematocrit: 38.2 % (ref 34.0–46.6)
Hemoglobin: 13.5 g/dL (ref 11.1–15.9)
Immature Grans (Abs): 0 10*3/uL (ref 0.0–0.1)
Immature Granulocytes: 0 %
Lymphocytes Absolute: 2 10*3/uL (ref 0.7–3.1)
Lymphs: 24 %
MCH: 33.5 pg — ABNORMAL HIGH (ref 26.6–33.0)
MCHC: 35.3 g/dL (ref 31.5–35.7)
MCV: 95 fL (ref 79–97)
Monocytes Absolute: 0.3 10*3/uL (ref 0.1–0.9)
Monocytes: 4 %
Neutrophils Absolute: 5.8 10*3/uL (ref 1.4–7.0)
Neutrophils: 70 %
Platelets: 144 10*3/uL — ABNORMAL LOW (ref 150–450)
RBC: 4.03 x10E6/uL (ref 3.77–5.28)
RDW: 12.2 % (ref 11.7–15.4)
WBC: 8.2 10*3/uL (ref 3.4–10.8)

## 2022-03-28 LAB — TSH: TSH: 0.83 u[IU]/mL (ref 0.450–4.500)

## 2022-03-28 LAB — VITAMIN D 25 HYDROXY (VIT D DEFICIENCY, FRACTURES): Vit D, 25-Hydroxy: 14.3 ng/mL — ABNORMAL LOW (ref 30.0–100.0)

## 2022-03-29 DIAGNOSIS — E559 Vitamin D deficiency, unspecified: Secondary | ICD-10-CM | POA: Insufficient documentation

## 2022-03-29 NOTE — Assessment & Plan Note (Signed)
Checking labs today. Await results. Treat as needed.  

## 2022-03-29 NOTE — Assessment & Plan Note (Signed)
Under good control on current regimen. Continue current regimen. Continue to monitor. Call with any concerns. Refills given.   

## 2022-03-29 NOTE — Assessment & Plan Note (Addendum)
Stable. Continue to monitor with neurology. Call with any concerns.

## 2022-03-29 NOTE — Assessment & Plan Note (Signed)
Not interested in quitting at this time. UA checked today.

## 2022-03-29 NOTE — Assessment & Plan Note (Signed)
Checking labs today. Await results.  

## 2022-03-30 ENCOUNTER — Other Ambulatory Visit: Payer: Self-pay | Admitting: Family Medicine

## 2022-03-30 MED ORDER — VITAMIN D (ERGOCALCIFEROL) 1.25 MG (50000 UNIT) PO CAPS: 50000.0000 [IU] | ORAL_CAPSULE | ORAL | 1 refills | Status: DC

## 2022-05-13 ENCOUNTER — Ambulatory Visit: Payer: PPO | Admitting: Psychiatry

## 2022-05-13 ENCOUNTER — Encounter: Payer: Self-pay | Admitting: Psychiatry

## 2022-05-13 VITALS — BP 120/81 | HR 105 | Temp 98.7°F | Ht <= 58 in | Wt 100.8 lb

## 2022-05-13 DIAGNOSIS — F250 Schizoaffective disorder, bipolar type: Secondary | ICD-10-CM | POA: Diagnosis not present

## 2022-05-13 DIAGNOSIS — Z91199 Patient's noncompliance with other medical treatment and regimen due to unspecified reason: Secondary | ICD-10-CM | POA: Insufficient documentation

## 2022-05-13 DIAGNOSIS — Z9189 Other specified personal risk factors, not elsewhere classified: Secondary | ICD-10-CM | POA: Diagnosis not present

## 2022-05-13 NOTE — Patient Instructions (Signed)
Please call for [id number] 

## 2022-05-13 NOTE — Progress Notes (Signed)
BH MD OP Progress Note  05/13/2022 5:22 PM Stacy Taylor  MRN:  [id number]  Chief Complaint:  Chief Complaint  Patient presents with   Follow-up   Medication Refill   Anxiety   Hallucinations   HPI: Stacy Taylor is a 32 year old female, lives in [place], married, has a history of schizoaffective disorder, migraine headaches, seizure-like spells was evaluated in office today.  Patient's last visit was on 10/29/2021.  Patient at that visit was advised to return in 4 weeks.  Patient however was noncompliant with treatment recommendation.  And returns today after 6 months.  Patient today reports she was busy with everything including her work.  She hence could not keep her appointments.  She reports she continues to be compliant on the medications as prescribed however the last time a prescription was provided for this patient was for 90 days in July 2023.  Patient claims that she did not get refills from any other providers however has been taking what was prescribed.  She reports overall she has been doing fairly well on the current medication regimen.  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  She also completed her 1 year anniversary at her work.  Excited about that.  Patient reports she has not had any seizure-like spells in a long time.  Reports sleep as good.  Reports appetite is fair.  Continues to have auditory hallucination, chronic however it does not bother her, she is able to cope.  Denies any suicidality, homicidality or perceptual disturbances.  Reports she had a good holiday season since a lot of her spouse's family moved into the neighborhood and hence they had several big gatherings.  That made her feel better.  Patient denies any side effects to medications.  Patient was advised to get EKG completed last visit in July however has been noncompliant.  Agrees to get it done today.  Denies any other  concerns today.  Visit Diagnosis:    ICD-10-CM   1. Schizoaffective disorder, bipolar type (HCC)  F25.0 EKG 12-Lead    2. At risk for long QT syndrome  Z91.89 EKG 12-Lead    EKG 12-Lead    3. Noncompliance with treatment plan  Z91.199       Past Psychiatric History: Reviewed past psychiatric history from progress note on 08/08/2021.  Past trials of medications like Zyprexa, gabapentin, multiple others.  Past Medical History:  Past Medical History:  Diagnosis Date   Anxiety    Asthma    Depression    Fetal alcohol syndrome    Migraines    Osteoporosis    Scoliosis    Seizures (HCC)     Past Surgical History:  Procedure Laterality Date   FRACTURE SURGERY     HAND SURGERY     TONSILECTOMY, ADENOIDECTOMY, BILATERAL MYRINGOTOMY AND TUBES     WRIST SURGERY      Family Psychiatric History: Reviewed family psychiatric history from progress note on 08/08/2021.  Family History:  Family History  Problem Relation Age of Onset   Drug abuse Other    Mental illness Other     Social History: Reviewed social history from progress note on 08/08/2021. Social History   Socioeconomic History   Marital status: Married    Spouse name: Not on file   Number of children: Not on file   Years of education: Not on file   Highest education level: Not on file  Occupational  History   Not on file  Tobacco Use   Smoking status: Former    Types: Cigarettes   Smokeless tobacco: Never   Tobacco comments:    1 cig per day reported 03/14/2020  Vaping Use   Vaping Use: Some days  Substance and Sexual Activity   Alcohol use: Yes    Comment: socially   Drug use: Never   Sexual activity: Yes    Birth control/protection: I.U.D.  Other Topics Concern   Not on file  Social History Narrative   ** Merged History Encounter **       Social Determinants of Health   Financial Resource Strain: Low Risk  (01/05/2022)   Overall Financial Resource Strain (CARDIA)    Difficulty of Paying Living  Expenses: Not hard at all  Food Insecurity: No Food Insecurity (01/05/2022)   Hunger Vital Sign    Worried About Running Out of Food in the Last Year: Never true    Ran Out of Food in the Last Year: Never true  Transportation Needs: No Transportation Needs (01/05/2022)   PRAPARE - Hydrologist (Medical): No    Lack of Transportation (Non-Medical): No  Physical Activity: Sufficiently Active (01/03/2021)   Exercise Vital Sign    Days of Exercise per Week: 7 days    Minutes of Exercise per Session: 90 min  Stress: No Stress Concern Present (01/05/2022)   [place]    Feeling of Stress : Only a little  Social Connections: Moderately Isolated (01/05/2022)   Social Connection and Isolation Panel [NHANES]    Frequency of Communication with Friends and Family: More than three times a week    Frequency of Social Gatherings with Friends and Family: Three times a week    Attends Religious Services: Never    Active Member of Clubs or Organizations: No    Attends Archivist Meetings: Never    Marital Status: Married    Allergies:  Allergies  Allergen Reactions   Ibuprofen     Abdominal pain, upset stomach    Metabolic Disorder Labs: Lab Results  Component Value Date   HGBA1C 3.9 (L) 02/14/2021   Lab Results  Component Value Date   PROLACTIN 5.8 02/14/2021   PROLACTIN 4.0 (L) 02/16/2020   Lab Results  Component Value Date   CHOL 150 03/27/2022   TRIG 36 03/27/2022   HDL 64 03/27/2022   LDLCALC 77 03/27/2022   LDLCALC 77 02/14/2021   Lab Results  Component Value Date   TSH 0.830 03/27/2022   TSH 0.709 02/14/2021    Therapeutic Level Labs: No results found for: "LITHIUM" No results found for: "VALPROATE" No results found for: "CBMZ"  Current Medications: Current Outpatient Medications  Medication Sig Dispense Refill   albuterol (VENTOLIN HFA) 108 (90 Base) MCG/ACT  inhaler Inhale 2 puffs into the lungs every 4 (four) hours as needed for wheezing or shortness of breath. 8 g 12   budesonide-formoterol (SYMBICORT) 160-4.5 MCG/ACT inhaler Inhale 2 puffs into the lungs 2 (two) times daily. 10.2 g 12   busPIRone (BUSPAR) 15 MG tablet Take 1 tablet (15 mg total) by mouth 2 (two) times daily. 180 tablet 0   gabapentin (NEURONTIN) 400 MG capsule Take 1 capsule (400 mg total) by mouth 3 (three) times daily. 270 capsule 0   levonorgestrel (LILETTA, 52 MG,) 19.5 MCG/DAY IUD IUD 1 each by Intrauterine route once.     nortriptyline (PAMELOR) 50 MG  capsule Take 50 mg by mouth at bedtime.     ondansetron (ZOFRAN ODT) 4 MG disintegrating tablet Take 1 tablet (4 mg total) by mouth every 8 (eight) hours as needed for nausea or vomiting. 20 tablet 12   QUEtiapine (SEROQUEL) 25 MG tablet Take 3 tablets (75 mg total) by mouth at bedtime. 90 tablet 1   rizatriptan (MAXALT-MLT) 10 MG disintegrating tablet Take by mouth.     Vitamin D, Ergocalciferol, (DRISDOL) 1.25 MG (50000 UNIT) CAPS capsule Take 1 capsule (50,000 Units total) by mouth once a week. 12 capsule 1   zoledronic acid (RECLAST) 5 MG/100ML SOLN injection Inject 5 mg into the vein once. Once yearly     No current facility-administered medications for this visit.     Musculoskeletal: Strength & Muscle Tone: within normal limits Gait & Station: normal Patient leans: N/A  Psychiatric Specialty Exam: Review of Systems  Psychiatric/Behavioral:  Positive for hallucinations (chronic).   All other systems reviewed and are negative.   Blood pressure 120/81, pulse (!) 105, temperature 98.7 F (37.1 C), temperature source Temporal, height 4' 9.25" (1.454 m), weight 100 lb 12.8 oz (45.7 kg).Body mass index is 21.62 kg/m.  General Appearance: Casual  Eye Contact:  Fair  Speech:  Clear and Coherent  Volume:  Normal  Mood:  Euthymic  Affect:  Congruent  Thought Process:  Goal Directed and Descriptions of Associations:  Intact  Orientation:  Full (Time, Place, and Person)  Thought Content: Hallucinations: Auditory chronic - coping well  Suicidal Thoughts:  No  Homicidal Thoughts:  No  Memory:  Immediate;   Fair Recent;   Fair Remote;   Fair  Judgement:  Fair  Insight:  Fair  Psychomotor Activity:  Normal  Concentration:  Concentration: Fair and Attention Span: Fair  Recall:  [employer] of Knowledge: Fair  Language: Fair  Akathisia:  No  Handed:  Right  AIMS (if indicated): done  Assets:  Communication Skills Desire for Improvement Housing Intimacy Talents/Skills  ADL's:  Intact  Cognition: WNL  Sleep:  Fair   Screenings: AIMS    Flowsheet Row Office Visit from 05/13/2022 in [facility] Office Visit from 10/29/2021 in [facility] Video Visit from 08/08/2021 in [facility]  AIMS Total Score 0 0 0      AUDIT    Flowsheet Row Clinical Support from 01/05/2022 in [facility]  Alcohol Use Disorder Identification Test Final Score (AUDIT) 4      GAD-7    Flowsheet Row Office Visit from 05/13/2022 in [facility] Office Visit from 03/27/2022 in [facility] Office Visit from 12/01/2021 in [facility] Office Visit from 08/29/2021 in [facility] Video Visit from 08/08/2021 in [facility]  Total GAD-7 Score 0 0 0 5 1      PHQ2-9    Flowsheet Row Office Visit from 05/13/2022 in [facility] Office Visit from 03/27/2022 in [facility] Clinical Support from 01/05/2022 in [facility] Office Visit from 12/01/2021 in [facility] Office Visit from 10/29/2021 in [facility]  PHQ-2 Total Score 0 0 0 0 1  PHQ-9 Total Score 0 0 4 2 4       Flowsheet Row Office Visit from 05/13/2022 in [facility] Office  Visit from 10/29/2021 in [facility] ED from 08/27/2021 in [facility]  C-SSRS RISK CATEGORY No Risk  Low Risk No Risk        Assessment and Plan: Stacy Taylor is a 32 year old Caucasian female, married, on SSI, lives in [place], has a history of schizoaffective disorder, migraine headaches, seizure-like spells was evaluated in office today.  Patient has been noncompliant with follow-up appointments, returns today after 6 months.  Patient currently reports mood symptoms are stable, will benefit from the following plan.  Plan  Schizoaffective-stable Seroquel 75 mg p.o. nightly, patient likely noncompliant since the last prescription was provided for 90 days in July 2023. Gabapentin 400 mg p.o. 3 times daily-continue per neurology BuSpar 15 mg p.o. twice daily  At risk for prolonged QT syndrome-patient noncompliant with EKG.  Discussed with patient to get this done today to be continued on her medications.  Will review this prior to refilling medications.  Patient provided phone #[phone].  Patient on multiple medications which can cause QT prolongation including Seroquel, BuSpar as well as nortriptyline ( currently prescribed per neurology for headaches.)  Noncompliance with treatment plan-provided education.  Encouraged compliance.  Follow-up in clinic in 2 to 3 months or sooner if needed.  This note was generated in part or whole with voice recognition software. Voice recognition is usually quite accurate but there are transcription errors that can and very often do occur. I apologize for any typographical errors that were not detected and corrected.      Stacy Alert, MD 05/14/2022, 1:10 PM

## 2022-05-14 ENCOUNTER — Ambulatory Visit
Admission: RE | Admit: 2022-05-14 | Discharge: 2022-05-14 | Disposition: A | Discharge status: Home / Self Care | Payer: PPO | Source: Ambulatory Visit | Attending: Psychiatry | Admitting: Psychiatry

## 2022-05-14 DIAGNOSIS — Z9189 Other specified personal risk factors, not elsewhere classified: Secondary | ICD-10-CM | POA: Diagnosis not present

## 2022-05-18 ENCOUNTER — Telehealth: Payer: Self-pay | Admitting: Psychiatry

## 2022-05-18 DIAGNOSIS — F250 Schizoaffective disorder, bipolar type: Secondary | ICD-10-CM

## 2022-05-18 DIAGNOSIS — F4320 Adjustment disorder, unspecified: Secondary | ICD-10-CM

## 2022-05-18 MED ORDER — QUETIAPINE FUMARATE 25 MG PO TABS: 75.0000 mg | ORAL_TABLET | Freq: Every day | ORAL | 2 refills | Status: DC

## 2022-05-18 MED ORDER — BUSPIRONE HCL 15 MG PO TABS: 15.0000 mg | ORAL_TABLET | Freq: Two times a day (BID) | ORAL | 0 refills | Status: DC

## 2022-05-18 NOTE — Telephone Encounter (Signed)
Attempted to contact patient to discuss EKG.  Had to leave a voicemail.  I have sent BuSpar and Seroquel to [facility]-reviewed EKG-normal sinus rhythm, [id number].  Could continue the current medications.

## 2022-05-28 DIAGNOSIS — W540XXA Bitten by dog, initial encounter: Secondary | ICD-10-CM | POA: Insufficient documentation

## 2022-05-28 DIAGNOSIS — S71151A Open bite, right thigh, initial encounter: Secondary | ICD-10-CM | POA: Insufficient documentation

## 2022-05-29 ENCOUNTER — Encounter: Payer: Self-pay | Admitting: Emergency Medicine

## 2022-05-29 ENCOUNTER — Emergency Department
Admission: EM | Admit: 2022-05-29 | Discharge: 2022-05-29 | Disposition: A | Discharge status: Home / Self Care | Payer: No Typology Code available for payment source | Attending: Emergency Medicine | Admitting: Emergency Medicine

## 2022-05-29 ENCOUNTER — Other Ambulatory Visit: Payer: Self-pay

## 2022-05-29 DIAGNOSIS — Y990 Civilian activity done for income or pay: Secondary | ICD-10-CM | POA: Diagnosis not present

## 2022-05-29 DIAGNOSIS — J45909 Unspecified asthma, uncomplicated: Secondary | ICD-10-CM | POA: Diagnosis not present

## 2022-05-29 DIAGNOSIS — S71131A Puncture wound without foreign body, right thigh, initial encounter: Secondary | ICD-10-CM | POA: Diagnosis not present

## 2022-05-29 DIAGNOSIS — W540XXA Bitten by dog, initial encounter: Secondary | ICD-10-CM | POA: Diagnosis not present

## 2022-05-29 DIAGNOSIS — S61032A Puncture wound without foreign body of left thumb without damage to nail, initial encounter: Secondary | ICD-10-CM | POA: Insufficient documentation

## 2022-05-29 DIAGNOSIS — S79921A Unspecified injury of right thigh, initial encounter: Secondary | ICD-10-CM | POA: Diagnosis present

## 2022-05-29 DIAGNOSIS — S71152A Open bite, left thigh, initial encounter: Secondary | ICD-10-CM | POA: Diagnosis not present

## 2022-05-29 DIAGNOSIS — S61052A Open bite of left thumb without damage to nail, initial encounter: Secondary | ICD-10-CM | POA: Diagnosis not present

## 2022-05-29 MED ORDER — AMOXICILLIN-POT CLAVULANATE 875-125 MG PO TABS: 1.0000 | ORAL_TABLET | Freq: Two times a day (BID) | ORAL | 0 refills | Status: AC

## 2022-05-29 MED ORDER — OXYCODONE-ACETAMINOPHEN 7.5-325 MG PO TABS: 1.0000 | ORAL_TABLET | Freq: Three times a day (TID) | ORAL | 0 refills | Status: DC | PRN

## 2022-05-29 MED ORDER — BACITRACIN ZINC 500 UNIT/GM EX OINT
TOPICAL_OINTMENT | Freq: Once | CUTANEOUS | Status: AC
  Administered 2022-05-29: 1 via TOPICAL
  Filled 2022-05-29: qty 0.9

## 2022-05-29 NOTE — ED Provider Notes (Signed)
[facility] Provider Note    Event Date/Time   First MD Initiated Contact with Patient 05/29/22 1418     (approximate)   History   Animal Bite   HPI  Stacy Taylor is a 32 y.o. female with history of asthma, migraines, seizures and as listed in EMR presents to the emergency department for treatment and evaluation of dog bite to the left thumb and right thigh. Dog is now with animal control. Patient went to urgent care afterward. No Rx given. Pain is severe and seems to be worsening.Marland Kitchen      Physical Exam   Triage Vital Signs: ED Triage Vitals [05/29/22 1314]  Enc Vitals Group     BP 112/85     Pulse Rate (!) 117     Resp 18     Temp 97.8 F (36.6 C)     Temp Source Oral     SpO2 97 %     Weight      Height      Head Circumference      Peak Flow      Pain Score 7     Pain Loc      Pain Edu?      Excl. in [place]?     Most recent vital signs: Vitals:   05/29/22 1314  BP: 112/85  Pulse: (!) 117  Resp: 18  Temp: 97.8 F (36.6 C)  SpO2: 97%    General: Awake, no distress.  CV:  Good peripheral perfusion.  Resp:  Normal effort.  Abd:  No distention.  Other:  Puncture wounds from dog bite on right anterior thigh with surrounding ecchymosis. Puncture wounds to left thumb noted as well.   ED Results / Procedures / Treatments   Labs (all labs ordered are listed, but only abnormal results are displayed) Labs Reviewed - No data to display   EKG     RADIOLOGY  Image and radiology report reviewed and interpreted by me. Radiology report consistent with the same.  Not indicated.  PROCEDURES:  Critical Care performed: No  Procedures   MEDICATIONS ORDERED IN ED:  Medications  bacitracin ointment (has no administration in time range)     IMPRESSION / MDM / ASSESSMENT AND PLAN / ED COURSE   I have reviewed the triage note.  Differential diagnosis includes, but is not limited to, dog bite, cellulitis  Patient's  presentation is most consistent with acute illness / injury with system symptoms.  32 year old female presents to the ER after being bitten by a dog at work yesterday. See HPI.   Plan today will be to put  her on antibiotic and give short course of Percocet for pain. She is to monitor closely for infection and see primary care or return to the ER for concerns.     FINAL CLINICAL IMPRESSION(S) / ED DIAGNOSES   Final diagnoses:  Dog bite, initial encounter     Rx / DC Orders   ED Discharge Orders          Ordered    amoxicillin-clavulanate (AUGMENTIN) 875-125 MG tablet  2 times daily        05/29/22 1446    oxyCODONE-acetaminophen (PERCOCET) 7.5-325 MG tablet  Every 8 hours PRN        05/29/22 1446             Note:  This document was prepared using Dragon voice recognition software and may include unintentional dictation errors.   Aimie Wagman,  Dessa Phi, FNP 05/29/22 1451    Vanessa Emlyn, MD 05/29/22 [phone]

## 2022-05-29 NOTE — ED Triage Notes (Signed)
Patient to ED via POV for animal bite that occurred yesterday while at work. Was seen at [facility] yesterday for same but states increasing pain. Puncture wounds on right thigh.

## 2022-08-12 ENCOUNTER — Ambulatory Visit: Payer: PPO | Admitting: Psychiatry

## 2022-08-24 ENCOUNTER — Telehealth: Payer: PPO | Admitting: Nurse Practitioner

## 2022-08-24 DIAGNOSIS — R112 Nausea with vomiting, unspecified: Secondary | ICD-10-CM | POA: Diagnosis not present

## 2022-08-24 MED ORDER — ONDANSETRON 4 MG PO TBDP: 4.0000 mg | ORAL_TABLET | Freq: Three times a day (TID) | ORAL | 0 refills | Status: DC | PRN

## 2022-08-24 NOTE — Progress Notes (Signed)
Virtual Visit Consent   Stacy Taylor, you are scheduled for a virtual visit with a [facility] provider today. Just as with appointments in the office, your consent must be obtained to participate. Your consent will be active for this visit and any virtual visit you may have with one of our providers in the next 365 days. If you have a MyChart account, a copy of this consent can be sent to you electronically.  As this is a virtual visit, video technology does not allow for your provider to perform a traditional examination. This may limit your provider's ability to fully assess your condition. If your provider identifies any concerns that need to be evaluated in person or the need to arrange testing (such as labs, EKG, etc.), we will make arrangements to do so. Although advances in technology are sophisticated, we cannot ensure that it will always work on either your end or our end. If the connection with a video visit is poor, the visit may have to be switched to a telephone visit. With either a video or telephone visit, we are not always able to ensure that we have a secure connection.  By engaging in this virtual visit, you consent to the provision of healthcare and authorize for your insurance to be billed (if applicable) for the services provided during this visit. Depending on your insurance coverage, you may receive a charge related to this service.  I need to obtain your verbal consent now. Are you willing to proceed with your visit today? Stacy Taylor has provided verbal consent on 08/24/2022 for a virtual visit (video or telephone). Stacy Simas, FNP  Date: 08/24/2022 2:21 PM  Virtual Visit via Video Note   I, Stacy Taylor, connected with  Stacy Taylor  ([id number], [birth year]) on 08/24/22 at  2:30 PM EDT by a video-enabled telemedicine application and verified that I am speaking with the correct person using two identifiers.  Location: Patient: Virtual Visit Location Patient:  Home Provider: Virtual Visit Location Provider: Home Office   I discussed the limitations of evaluation and management by telemedicine and the availability of in person appointments. The patient expressed understanding and agreed to proceed.    History of Present Illness: Stacy Taylor is a 32 y.o. who identifies as a female who was assigned female at birth, and is being seen today for nausea and vomiting.  Symptom onset was 9am today She has only been vomiting denies diarrhea   Possible fever she is unsure   She is having abdominal pain in the epigastric region all above umbilical region   She cannot keep water down, last time she urinated was 7am   She has not taken anything for nausea at this time   She does not feel that this is associated with a migraine   Problems:  Patient Active Problem List   Diagnosis Date Noted   Noncompliance with treatment plan 05/13/2022   Vitamin D deficiency 03/29/2022   Adjustment disorder 10/29/2021   History of seizures 08/08/2021   Bilateral impacted cerumen 05/15/2021   Dizziness 02/14/2021   Spell of abnormal behavior 10/10/2020   Photophobia 10/10/2020   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   High risk medication use 12/18/2019   At risk for long QT syndrome 12/18/2019   Schizoaffective disorder, bipolar type (HCC) 12/18/2019   Tobacco use disorder 12/18/2019   Migraines    Osteoporosis without current pathological fracture 10/17/2019   Asthma    Anxiety disorder    History of depression 12/28/2018   Attention  deficit hyperactivity disorder 12/28/2018    Allergies:  Allergies  Allergen Reactions   Ibuprofen     Abdominal pain, upset stomach   Medications:  Current Outpatient Medications:    albuterol (VENTOLIN HFA) 108 (90 Base) MCG/ACT inhaler, Inhale 2 puffs into the lungs every 4 (four) hours as needed for wheezing or shortness of breath., Disp: 8 g, Rfl: 12   budesonide-formoterol (SYMBICORT) 160-4.5 MCG/ACT inhaler, Inhale 2  puffs into the lungs 2 (two) times daily., Disp: 10.2 g, Rfl: 12   busPIRone (BUSPAR) 15 MG tablet, Take 1 tablet (15 mg total) by mouth 2 (two) times daily., Disp: 180 tablet, Rfl: 0   gabapentin (NEURONTIN) 400 MG capsule, Take 1 capsule (400 mg total) by mouth 3 (three) times daily., Disp: 270 capsule, Rfl: 0   levonorgestrel (LILETTA, 52 MG,) 19.5 MCG/DAY IUD IUD, 1 each by Intrauterine route once., Disp: , Rfl:    nortriptyline (PAMELOR) 50 MG capsule, Take 50 mg by mouth at bedtime., Disp: , Rfl:    ondansetron (ZOFRAN ODT) 4 MG disintegrating tablet, Take 1 tablet (4 mg total) by mouth every 8 (eight) hours as needed for nausea or vomiting., Disp: 20 tablet, Rfl: 12   oxyCODONE-acetaminophen (PERCOCET) 7.5-325 MG tablet, Take 1 tablet by mouth every 8 (eight) hours as needed for severe pain., Disp: 12 tablet, Rfl: 0   QUEtiapine (SEROQUEL) 25 MG tablet, Take 3 tablets (75 mg total) by mouth at bedtime., Disp: 90 tablet, Rfl: 2   rizatriptan (MAXALT-MLT) 10 MG disintegrating tablet, Take by mouth., Disp: , Rfl:    Vitamin D, Ergocalciferol, (DRISDOL) 1.25 MG (50000 UNIT) CAPS capsule, Take 1 capsule (50,000 Units total) by mouth once a week., Disp: 12 capsule, Rfl: 1   zoledronic acid (RECLAST) 5 MG/100ML SOLN injection, Inject 5 mg into the vein once. Once yearly, Disp: , Rfl:   Observations/Objective: Patient is well-developed, well-nourished in no acute distress.  Resting comfortably  at home.  Head is normocephalic, atraumatic.  No labored breathing.  Speech is clear and coherent with logical content.  Patient is alert and oriented at baseline.    Assessment and Plan: 1. Nausea and vomiting, unspecified vomiting type  Start with fluids ice cubes and water. Stick with water first until able to tolerate and able to urinate   Once fluids are tolerated well start with bland foods toast/apples bananas etc  - ondansetron (ZOFRAN-ODT) 4 MG disintegrating tablet; Take 1 tablet (4 mg  total) by mouth every 8 (eight) hours as needed for nausea or vomiting.  Dispense: 20 tablet; Refill: 0    If unable to tolerated fluids for 12 hours or with failure to void seek follow up for dehydration evaluation at discussed   Follow Up Instructions: I discussed the assessment and treatment plan with the patient. The patient was provided an opportunity to ask questions and all were answered. The patient agreed with the plan and demonstrated an understanding of the instructions.  A copy of instructions were sent to the patient via MyChart unless otherwise noted below.    The patient was advised to call back or seek an in-person evaluation if the symptoms worsen or if the condition fails to improve as anticipated.  Time:  I spent 12 minutes with the patient via telehealth technology discussing the above problems/concerns.    Stacy Simas, FNP

## 2022-09-02 DIAGNOSIS — G43109 Migraine with aura, not intractable, without status migrainosus: Secondary | ICD-10-CM | POA: Diagnosis not present

## 2022-09-02 DIAGNOSIS — R569 Unspecified convulsions: Secondary | ICD-10-CM | POA: Diagnosis not present

## 2022-09-02 DIAGNOSIS — F411 Generalized anxiety disorder: Secondary | ICD-10-CM | POA: Diagnosis not present

## 2022-09-02 DIAGNOSIS — R42 Dizziness and giddiness: Secondary | ICD-10-CM | POA: Diagnosis not present

## 2022-09-02 DIAGNOSIS — H53149 Visual discomfort, unspecified: Secondary | ICD-10-CM | POA: Diagnosis not present

## 2022-10-05 DIAGNOSIS — F411 Generalized anxiety disorder: Secondary | ICD-10-CM | POA: Diagnosis not present

## 2022-10-05 DIAGNOSIS — R42 Dizziness and giddiness: Secondary | ICD-10-CM | POA: Diagnosis not present

## 2022-10-05 DIAGNOSIS — F0781 Postconcussional syndrome: Secondary | ICD-10-CM | POA: Diagnosis not present

## 2022-10-05 DIAGNOSIS — R569 Unspecified convulsions: Secondary | ICD-10-CM | POA: Diagnosis not present

## 2022-10-05 DIAGNOSIS — G43109 Migraine with aura, not intractable, without status migrainosus: Secondary | ICD-10-CM | POA: Diagnosis not present

## 2022-10-05 DIAGNOSIS — H53149 Visual discomfort, unspecified: Secondary | ICD-10-CM | POA: Diagnosis not present

## 2022-10-07 ENCOUNTER — Encounter: Payer: Self-pay | Admitting: Psychiatry

## 2022-10-07 ENCOUNTER — Telehealth (INDEPENDENT_AMBULATORY_CARE_PROVIDER_SITE_OTHER): Payer: PPO | Admitting: Psychiatry

## 2022-10-07 DIAGNOSIS — Z79899 Other long term (current) drug therapy: Secondary | ICD-10-CM | POA: Diagnosis not present

## 2022-10-07 DIAGNOSIS — F250 Schizoaffective disorder, bipolar type: Secondary | ICD-10-CM | POA: Diagnosis not present

## 2022-10-07 MED ORDER — QUETIAPINE FUMARATE 25 MG PO TABS: 75.0000 mg | ORAL_TABLET | Freq: Every day | ORAL | 2 refills | Status: DC

## 2022-10-07 MED ORDER — BUSPIRONE HCL 15 MG PO TABS: 15.0000 mg | ORAL_TABLET | Freq: Two times a day (BID) | ORAL | 0 refills | Status: DC

## 2022-10-07 NOTE — Progress Notes (Signed)
Virtual Visit via Video Note  I connected with Stacy Taylor on 10/07/22 at  2:30 PM EDT by a video enabled telemedicine application and verified that I am speaking with the correct person using two identifiers.  Location Provider Location : [facility] Patient Location : Home  Participants: Patient , Provider    I discussed the limitations of evaluation and management by telemedicine and the availability of in person appointments. The patient expressed understanding and agreed to proceed.   I discussed the assessment and treatment plan with the patient. The patient was provided an opportunity to ask questions and all were answered. The patient agreed with the plan and demonstrated an understanding of the instructions.   The patient was advised to call back or seek an in-person evaluation if the symptoms worsen or if the condition fails to improve as anticipated.   BH MD OP Progress Note  10/07/2022 2:59 PM Stacy Taylor  MRN:  [id number]  Chief Complaint:  Chief Complaint  Patient presents with   Follow-up   Anxiety   Depression   Medication Refill   HPI: Stacy Taylor is a 32 year old female, lives in [place], married, has a history of schizoaffective disorder, migraine headaches, seizure-like spells was evaluated by telemedicine today.  Patient today reports she is currently struggling with flulike symptoms.  She is making sure she stays well-hydrated.  She tested negative for COVID-19.  She is taking couple of days off from work to get some rest.  She looks forward to going back to work this weekend.  Patient reports overall she is currently doing well with regards to her anxiety and depression.  Denies any mood swings.  Does have continued auditory hallucinations on and off however they are faint and it does not bother her.  Patient reports sleep as good.  She sticks to a good schedule and that helps.  Patient denies any suicidality, homicidality or perceptual  disturbances.  Patient denies any side effects to medications.  Patient denies any other concerns today.  Visit Diagnosis:    ICD-10-CM   1. Schizoaffective disorder, bipolar type (HCC)  F25.0 Hemoglobin A1C    Prolactin    QUEtiapine (SEROQUEL) 25 MG tablet    busPIRone (BUSPAR) 15 MG tablet    2. High risk medication use  Z79.899 Hemoglobin A1C    Prolactin      Past Psychiatric History: I have reviewed past psychiatric history from progress note on 08/08/2021.  Past trials of medications like Zyprexa, gabapentin, multiple others.  Past Medical History:  Past Medical History:  Diagnosis Date   Anxiety    Asthma    Depression    Fetal alcohol syndrome    Migraines    Osteoporosis    Scoliosis    Seizures (HCC)     Past Surgical History:  Procedure Laterality Date   FRACTURE SURGERY     HAND SURGERY     TONSILECTOMY, ADENOIDECTOMY, BILATERAL MYRINGOTOMY AND TUBES     WRIST SURGERY      Family Psychiatric History: I have reviewed family psychiatric history from progress note on 08/08/2021.  Family History:  Family History  Problem Relation Age of Onset   Drug abuse Other    Mental illness Other     Social History: I have reviewed social history from progress note on 08/08/2021. Social History   Socioeconomic History   Marital status: Married    Spouse name: Not on file   Number of children: Not on file   Years of education:  Not on file   Highest education level: Not on file  Occupational History   Not on file  Tobacco Use   Smoking status: Former    Types: Cigarettes   Smokeless tobacco: Never   Tobacco comments:    1 cig per day reported 03/14/2020  Vaping Use   Vaping Use: Some days  Substance and Sexual Activity   Alcohol use: Yes    Comment: socially   Drug use: Never   Sexual activity: Yes    Birth control/protection: I.U.D.  Other Topics Concern   Not on file  Social History Narrative   ** Merged History Encounter **       Social  Determinants of Health   Financial Resource Strain: Low Risk  (01/05/2022)   Overall Financial Resource Strain (CARDIA)    Difficulty of Paying Living Expenses: Not hard at all  Food Insecurity: No Food Insecurity (01/05/2022)   Hunger Vital Sign    Worried About Running Out of Food in the Last Year: Never true    Ran Out of Food in the Last Year: Never true  Transportation Needs: No Transportation Needs (01/05/2022)   PRAPARE - Administrator, Civil Service (Medical): No    Lack of Transportation (Non-Medical): No  Physical Activity: Sufficiently Active (01/03/2021)   Exercise Vital Sign    Days of Exercise per Week: 7 days    Minutes of Exercise per Session: 90 min  Stress: No Stress Concern Present (01/05/2022)   [employer] of Occupational Health - Occupational Stress Questionnaire    Feeling of Stress : Only a little  Social Connections: Moderately Isolated (01/05/2022)   Social Connection and Isolation Panel [NHANES]    Frequency of Communication with Friends and Family: More than three times a week    Frequency of Social Gatherings with Friends and Family: Three times a week    Attends Religious Services: Never    Active Member of Clubs or Organizations: No    Attends Banker Meetings: Never    Marital Status: Married    Allergies:  Allergies  Allergen Reactions   Ibuprofen     Abdominal pain, upset stomach    Metabolic Disorder Labs: Lab Results  Component Value Date   HGBA1C 3.9 (L) 02/14/2021   Lab Results  Component Value Date   PROLACTIN 5.8 02/14/2021   PROLACTIN 4.0 (L) 02/16/2020   Lab Results  Component Value Date   CHOL 150 03/27/2022   TRIG 36 03/27/2022   HDL 64 03/27/2022   LDLCALC 77 03/27/2022   LDLCALC 77 02/14/2021   Lab Results  Component Value Date   TSH 0.830 03/27/2022   TSH 0.709 02/14/2021    Therapeutic Level Labs: No results found for: "LITHIUM" No results found for: "VALPROATE" No results found  for: "CBMZ"  Current Medications: Current Outpatient Medications  Medication Sig Dispense Refill   albuterol (VENTOLIN HFA) 108 (90 Base) MCG/ACT inhaler Inhale 2 puffs into the lungs every 4 (four) hours as needed for wheezing or shortness of breath. 8 g 12   budesonide-formoterol (SYMBICORT) 160-4.5 MCG/ACT inhaler Inhale 2 puffs into the lungs 2 (two) times daily. 10.2 g 12   gabapentin (NEURONTIN) 400 MG capsule Take 1 capsule (400 mg total) by mouth 3 (three) times daily. 270 capsule 0   levonorgestrel (LILETTA, 52 MG,) 19.5 MCG/DAY IUD IUD 1 each by Intrauterine route once.     nortriptyline (PAMELOR) 50 MG capsule Take 50 mg by mouth at bedtime.  ondansetron (ZOFRAN-ODT) 4 MG disintegrating tablet Take 1 tablet (4 mg total) by mouth every 8 (eight) hours as needed for nausea or vomiting. 20 tablet 0   Vitamin D, Ergocalciferol, (DRISDOL) 1.25 MG (50000 UNIT) CAPS capsule Take 1 capsule (50,000 Units total) by mouth once a week. 12 capsule 1   zoledronic acid (RECLAST) 5 MG/100ML SOLN injection Inject 5 mg into the vein once. Once yearly     zolmitriptan (ZOMIG) 5 MG tablet Take 5 mg by mouth as needed for migraine.     busPIRone (BUSPAR) 15 MG tablet Take 1 tablet (15 mg total) by mouth 2 (two) times daily. 180 tablet 0   QUEtiapine (SEROQUEL) 25 MG tablet Take 3 tablets (75 mg total) by mouth at bedtime. 90 tablet 2   No current facility-administered medications for this visit.     Musculoskeletal: Strength & Muscle Tone:  UTA Gait & Station:  Seated Patient leans: N/A  Psychiatric Specialty Exam: Review of Systems  HENT:  Positive for congestion and sinus pressure.   Psychiatric/Behavioral: Negative.      There were no vitals taken for this visit.There is no height or weight on file to calculate BMI.  General Appearance: Fairly Groomed  Eye Contact:  Fair  Speech:  Clear and Coherent  Volume:  Normal  Mood:  Euthymic  Affect:  Congruent  Thought Process:  Goal  Directed and Descriptions of Associations: Intact  Orientation:  Full (Time, Place, and Person)  Thought Content: Logical   Suicidal Thoughts:  No  Homicidal Thoughts:  No  Memory:  Immediate;   Fair Recent;   Fair Remote;   Fair  Judgement:  Fair  Insight:  Fair  Psychomotor Activity:  Normal  Concentration:  Concentration: Fair and Attention Span: Fair  Recall:  [employer] of Knowledge: Fair  Language: Fair  Akathisia:  No  Handed:  Right  AIMS (if indicated): not done  Assets:  Communication Skills Desire for Improvement Housing Intimacy Social Support  ADL's:  Intact  Cognition: WNL  Sleep:  Fair   Screenings: Geneticist, molecular Office Visit from 05/13/2022 in [facility] Office Visit from 10/29/2021 in [facility] Video Visit from 08/08/2021 in [facility]  AIMS Total Score 0 0 0      AUDIT    Flowsheet Row Clinical Support from 01/05/2022 in [facility]  Alcohol Use Disorder Identification Test Final Score (AUDIT) 4      GAD-7    Flowsheet Row Office Visit from 05/13/2022 in [facility] Office Visit from 03/27/2022 in [facility] Office Visit from 12/01/2021 in [facility] Office Visit from 08/29/2021 in [facility] Video Visit from 08/08/2021 in [facility]  Total GAD-7 Score 0 0 0 5 1      PHQ2-9    Flowsheet Row Video Visit from 10/07/2022 in [facility] Office Visit from 05/13/2022 in [facility] Office Visit from 03/27/2022 in [facility] Clinical Support from 01/05/2022 in [facility] Office Visit from 12/01/2021 in [facility]  PHQ-2 Total Score 0 0 0 0 0  PHQ-9 Total Score -- 0 0 4 2      Flowsheet Row Video Visit from 10/07/2022 in [facility] ED from 05/29/2022 in [facility] Visit from 05/13/2022 in [facility]  C-SSRS RISK CATEGORY No Risk No Risk No Risk        Assessment and Plan: Stacy Taylor is a 32 year old Caucasian female, married, on SSI, lives in [place], has a history of schizoaffective disorder, migraine headaches, seizure-like spells was evaluated by telemedicine today.  Patient has been doing fairly well on the current medication regimen.  Plan as noted below.  Plan Schizoaffective disorder-stable Seroquel 75 mg p.o. nightly.  Will consider tapering down in the future. Gabapentin 400 mg p.o. 3 times daily-prescribed by neurology. BuSpar 15 mg p.o. twice daily EKG reviewed-normal sinus rhythm-[id number]-469-05/14/2022.   High risk medication use-I have ordered prolactin level, hemoglobin A1c-patient agrees to get it done.  Patient also advised to get help for her flulike symptoms if it gets worse.  Follow-up in clinic in 3 to 4 months or sooner in person.    Consent: Patient/Guardian gives verbal consent for treatment and assignment of benefits for services provided during this visit. Patient/Guardian expressed understanding and agreed to proceed.   This note was generated in part or whole with voice recognition software. Voice recognition is usually quite accurate but there are transcription errors that can and very often do occur. I apologize for any typographical errors that were not detected and corrected.     Jomarie Longs, MD 10/07/2022, 2:59 PM

## 2023-02-02 ENCOUNTER — Other Ambulatory Visit
Admission: RE | Admit: 2023-02-02 | Discharge: 2023-02-02 | Disposition: A | Discharge status: Home / Self Care | Payer: PPO | Source: Ambulatory Visit | Attending: Psychiatry | Admitting: Psychiatry

## 2023-02-02 ENCOUNTER — Encounter: Payer: Self-pay | Admitting: Psychiatry

## 2023-02-02 ENCOUNTER — Ambulatory Visit: Payer: PPO | Admitting: Psychiatry

## 2023-02-02 VITALS — BP 101/68 | HR 102 | Temp 97.2°F | Ht <= 58 in | Wt 98.6 lb

## 2023-02-02 DIAGNOSIS — Z79899 Other long term (current) drug therapy: Secondary | ICD-10-CM

## 2023-02-02 DIAGNOSIS — F250 Schizoaffective disorder, bipolar type: Secondary | ICD-10-CM | POA: Diagnosis not present

## 2023-02-02 LAB — HEMOGLOBIN A1C
Hgb A1c MFr Bld: 3.9 % — ABNORMAL LOW (ref 4.8–5.6)
Mean Plasma Glucose: 65.23 mg/dL

## 2023-02-02 MED ORDER — BUSPIRONE HCL 15 MG PO TABS: 15.0000 mg | ORAL_TABLET | Freq: Two times a day (BID) | ORAL | 1 refills | Status: DC

## 2023-02-02 MED ORDER — QUETIAPINE FUMARATE 25 MG PO TABS: 50.0000 mg | ORAL_TABLET | Freq: Every day | ORAL | 1 refills | Status: DC

## 2023-02-02 NOTE — Progress Notes (Signed)
BH MD OP Progress Note  02/02/2023 2:34 PM Stacy Taylor  MRN:  [id number]  Chief Complaint:  Chief Complaint  Patient presents with   Follow-up   Depression   Anxiety   Medication Refill   HPI: Stacy Taylor is a 32 year old female, lives in [place], married, has a history of schizoaffective disorder, migraine headaches, seizure-like spells was evaluated in office today.  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  She reports she is excited about that.  She reports she is aware that holiday season can be stressful however she is hopeful she will be able to manage it okay.  Patient denies any significant mood lability.  Patient denies any depression or anxiety symptoms.  Reports she continues to have auditory hallucinations of hearing 'whispers' as well as visual hallucinations of seeing images of people.  Patient however reports this is very normal for her and it really does not bother her at all.  She is interested in tapering down the Seroquel since she does not want to be on these medications for a long time.  She will let writer know if she has any worsening mood symptoms or psychosis.  Patient reports sleep as good.  Reports appetite is fair.  Patient reports recently she had an incident when she was bitten by a dog at work.  She reports she did get treatment for that since the dog bite got infected.  She denies any anxiety related to that at this time.  She denies any suicidality or homicidality.  She reports her spouse is supportive.  Denies any other concerns today.  Visit Diagnosis:    ICD-10-CM   1. Schizoaffective disorder, bipolar type (HCC)  F25.0 QUEtiapine (SEROQUEL) 25 MG tablet    busPIRone (BUSPAR) 15 MG tablet    2. High risk medication use  Z79.899       Past Psychiatric History: I have reviewed past psychiatric history from progress note on 08/08/2021.  Past trials of medications like Zyprexa, gabapentin,  multiple others.  Past Medical History:  Past Medical History:  Diagnosis Date   Anxiety    Asthma    Depression    Fetal alcohol syndrome    Migraines    Osteoporosis    Scoliosis    Seizures (HCC)     Past Surgical History:  Procedure Laterality Date   FRACTURE SURGERY     HAND SURGERY     TONSILECTOMY, ADENOIDECTOMY, BILATERAL MYRINGOTOMY AND TUBES     WRIST SURGERY      Family Psychiatric History: I have reviewed family psychiatric history from progress note on 08/08/2021.  Family History:  Family History  Problem Relation Age of Onset   Drug abuse Other    Mental illness Other     Social History: I have reviewed social history from progress note on 08/08/2021. Social History   Socioeconomic History   Marital status: Married    Spouse name: Not on file   Number of children: Not on file   Years of education: Not on file   Highest education level: Not on file  Occupational History   Not on file  Tobacco Use   Smoking status: Former    Types: Cigarettes   Smokeless tobacco: Never   Tobacco comments:    1 cig per day reported 03/14/2020  Vaping Use   Vaping status: Some Days  Substance and Sexual Activity   Alcohol use: Yes    Comment: socially  Drug use: Never   Sexual activity: Yes    Birth control/protection: I.U.D.  Other Topics Concern   Not on file  Social History Narrative   ** Merged History Encounter **       Social Determinants of Health   Financial Resource Strain: Low Risk  (01/05/2022)   Overall Financial Resource Strain (CARDIA)    Difficulty of Paying Living Expenses: Not hard at all  Food Insecurity: No Food Insecurity (01/05/2022)   Hunger Vital Sign    Worried About Running Out of Food in the Last Year: Never true    Ran Out of Food in the Last Year: Never true  Transportation Needs: No Transportation Needs (01/05/2022)   PRAPARE - Administrator, Civil Service (Medical): No    Lack of Transportation (Non-Medical): No   Physical Activity: Sufficiently Active (01/03/2021)   Exercise Vital Sign    Days of Exercise per Week: 7 days    Minutes of Exercise per Session: 90 min  Stress: No Stress Concern Present (01/05/2022)   [employer] of Occupational Health - Occupational Stress Questionnaire    Feeling of Stress : Only a little  Social Connections: Moderately Isolated (01/05/2022)   Social Connection and Isolation Panel [NHANES]    Frequency of Communication with Friends and Family: More than three times a week    Frequency of Social Gatherings with Friends and Family: Three times a week    Attends Religious Services: Never    Active Member of Clubs or Organizations: No    Attends Banker Meetings: Never    Marital Status: Married    Allergies:  Allergies  Allergen Reactions   Ibuprofen     Abdominal pain, upset stomach    Metabolic Disorder Labs: Lab Results  Component Value Date   HGBA1C 3.9 (L) 02/14/2021   Lab Results  Component Value Date   PROLACTIN 5.8 02/14/2021   PROLACTIN 4.0 (L) 02/16/2020   Lab Results  Component Value Date   CHOL 150 03/27/2022   TRIG 36 03/27/2022   HDL 64 03/27/2022   LDLCALC 77 03/27/2022   LDLCALC 77 02/14/2021   Lab Results  Component Value Date   TSH 0.830 03/27/2022   TSH 0.709 02/14/2021    Therapeutic Level Labs: No results found for: "LITHIUM" No results found for: "VALPROATE" No results found for: "CBMZ"  Current Medications: Current Outpatient Medications  Medication Sig Dispense Refill   albuterol (VENTOLIN HFA) 108 (90 Base) MCG/ACT inhaler Inhale 2 puffs into the lungs every 4 (four) hours as needed for wheezing or shortness of breath. 8 g 12   budesonide-formoterol (SYMBICORT) 160-4.5 MCG/ACT inhaler Inhale 2 puffs into the lungs 2 (two) times daily. 10.2 g 12   gabapentin (NEURONTIN) 400 MG capsule Take 1 capsule (400 mg total) by mouth 3 (three) times daily. 270 capsule 0   levonorgestrel (LILETTA, 52 MG,) 19.5  MCG/DAY IUD IUD 1 each by Intrauterine route once.     nortriptyline (PAMELOR) 10 MG capsule Take 10 mg by mouth every morning.     nortriptyline (PAMELOR) 50 MG capsule Take 50 mg by mouth at bedtime.     ondansetron (ZOFRAN-ODT) 4 MG disintegrating tablet Take 1 tablet (4 mg total) by mouth every 8 (eight) hours as needed for nausea or vomiting. 20 tablet 0   topiramate (TOPAMAX) 100 MG tablet Take 1 tablet by mouth at bedtime.     Vitamin D, Ergocalciferol, (DRISDOL) 1.25 MG (50000 UNIT) CAPS capsule Take 1  capsule (50,000 Units total) by mouth once a week. 12 capsule 1   zoledronic acid (RECLAST) 5 MG/100ML SOLN injection Inject 5 mg into the vein once. Once yearly     zolmitriptan (ZOMIG) 5 MG tablet Take 5 mg by mouth as needed for migraine.     busPIRone (BUSPAR) 15 MG tablet Take 1 tablet (15 mg total) by mouth 2 (two) times daily. 180 tablet 1   QUEtiapine (SEROQUEL) 25 MG tablet Take 2 tablets (50 mg total) by mouth at bedtime. 180 tablet 1   No current facility-administered medications for this visit.     Musculoskeletal: Strength & Muscle Tone: within normal limits Gait & Station: normal Patient leans: N/A  Psychiatric Specialty Exam: Review of Systems  Psychiatric/Behavioral:  Positive for hallucinations (chronic - does not bother).     Blood pressure 101/68, pulse (!) 102, temperature (!) 97.2 F (36.2 C), temperature source Skin, height 4' 9.25" (1.454 m), weight 98 lb 9.6 oz (44.7 kg).Body mass index is 21.15 kg/m.  General Appearance: Fairly Groomed  Eye Contact:  Fair  Speech:  Normal Rate  Volume:  Normal  Mood:  Euthymic  Affect:  Appropriate  Thought Process:  Goal Directed and Descriptions of Associations: Intact  Orientation:  Full (Time, Place, and Person)  Thought Content: Hallucinations: Auditory Visual hear whispers, sees images of people , does not bother , kind of normal for her  Suicidal Thoughts:  No  Homicidal Thoughts:  No  Memory:  Immediate;    Fair Recent;   Fair Remote;   Fair  Judgement:  Fair  Insight:  Fair  Psychomotor Activity:  Normal  Concentration:  Concentration: Fair and Attention Span: Fair  Recall:  [employer] of Knowledge: Fair  Language: Fair  Akathisia:  No  Handed:  Right  AIMS (if indicated): done  Assets:  Communication Skills Desire for Improvement Housing Social Support  ADL's:  Intact  Cognition: WNL  Sleep:  Fair   Screenings: Geneticist, molecular Office Visit from 02/02/2023 in [facility] Office Visit from 05/13/2022 in [facility] Office Visit from 10/29/2021 in [facility] Video Visit from 08/08/2021 in [facility]  AIMS Total Score 0 0 0 0      AUDIT    Flowsheet Row Clinical Support from 01/05/2022 in [facility]  Alcohol Use Disorder Identification Test Final Score (AUDIT) 4      GAD-7    Flowsheet Row Office Visit from 02/02/2023 in [facility] Office Visit from 05/13/2022 in [facility] Office Visit from 03/27/2022 in [facility] Office Visit from 12/01/2021 in [facility] Office Visit from 08/29/2021 in [facility]  Total GAD-7 Score 1 0 0 0 5      PHQ2-9    Flowsheet Row Office Visit from 02/02/2023 in [facility] Video Visit from 10/07/2022 in [facility] Office Visit from 05/13/2022 in [facility] Office Visit from 03/27/2022 in [facility] Clinical Support from 01/05/2022 in [facility]  PHQ-2 Total Score 0 0 0 0 0  PHQ-9 Total Score -- -- 0 0 4      Flowsheet Row Office Visit  from 02/02/2023 in [facility] Video Visit from 10/07/2022 in [facility] ED from 05/29/2022 in [facility]  C-SSRS RISK CATEGORY Low Risk No Risk No Risk        Assessment and Plan: Sakinah Rosamond is a 32 year old Caucasian female, married, on SSI, lives in [place], has a history of schizoaffective disorder, migraine headaches, seizure-like spells was evaluated in office today.  Patient is currently stable and is interested in tapering down Seroquel, discussed plan as noted below.  Plan Schizoaffective disorder-stable Will reduce Seroquel to 50 mg p.o. nightly Gabapentin 400 mg 3 times daily-prescribed by neurology BuSpar 15 mg p.o. twice daily  High risk medication use pending labs prolactin level, hemoglobin A1c.  Patient agrees to get it done today.  Lab order provided to patient.  Discussed with patient to monitor her symptoms closely and if she notices any worsening mood symptoms or psychosis to go back on Seroquel 75 mg and to schedule a sooner appointment.  Follow-up in clinic in 2 to 3 months or sooner if needed.  Consent: Patient/Guardian gives verbal consent for treatment and assignment of benefits for services provided during this visit. Patient/Guardian expressed understanding and agreed to proceed.  This note was generated in part or whole with voice recognition software. Voice recognition is usually quite accurate but there are transcription errors that can and very often do occur. I apologize for any typographical errors that were not detected and corrected.     Jomarie Longs, MD 02/02/2023, 2:34 PM

## 2023-02-03 LAB — PROLACTIN: Prolactin: 8.8 ng/mL (ref 4.8–33.4)

## 2023-03-10 IMAGING — DX DG FOREARM 2V*L*
2 series · 2 of 2 positions shown · non-contrast
Comparison: None.

CLINICAL DATA: 29-year-old female with dog bite to the left upper
extremity.

EXAM:
LEFT FOREARM - 2 VIEW

[forearm ap]
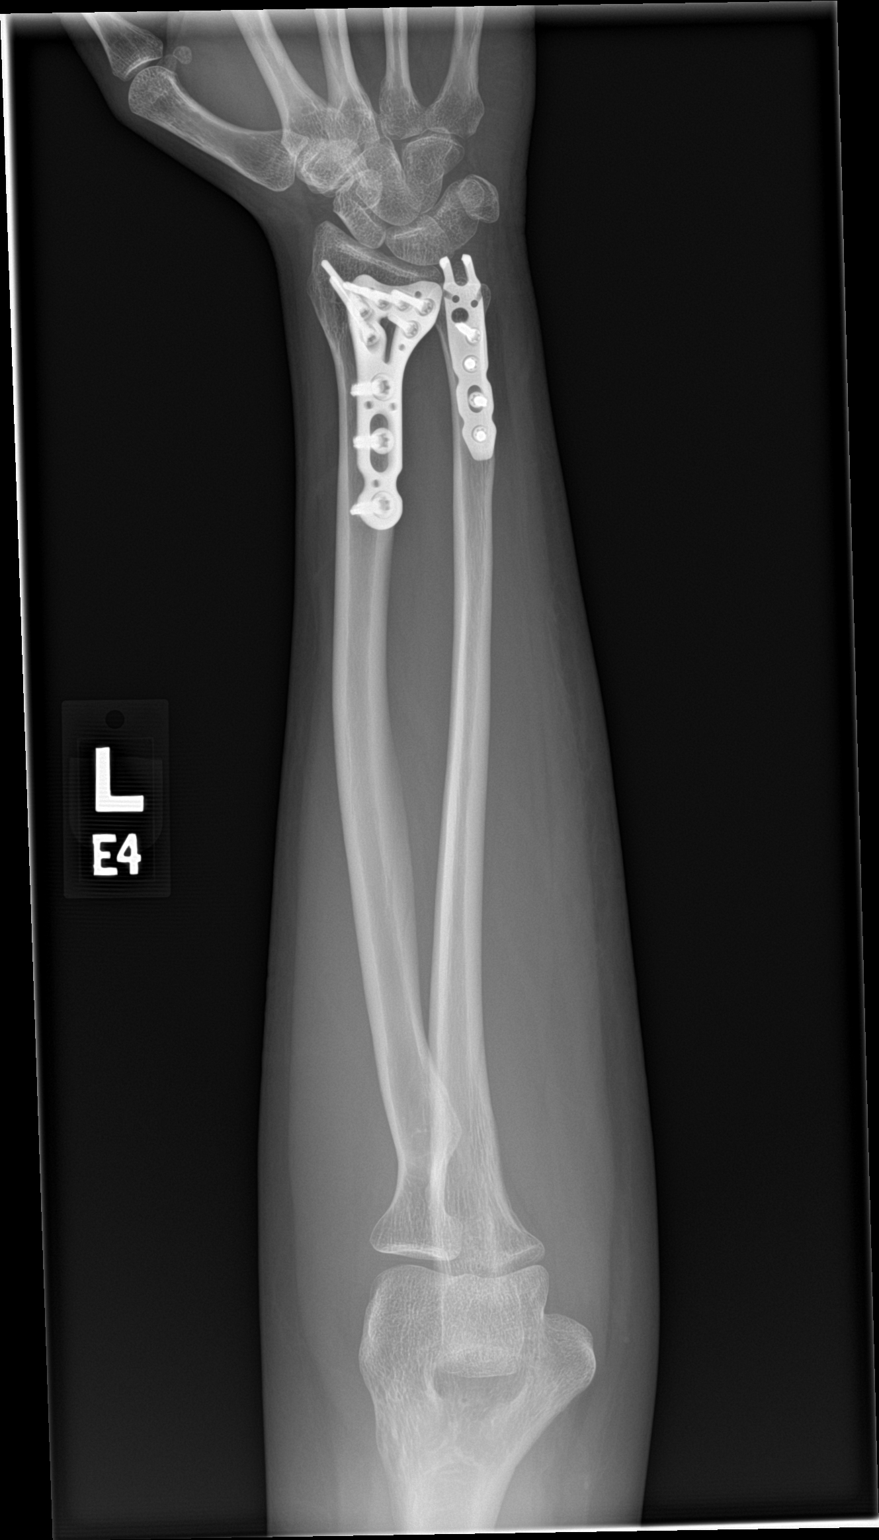

[forearm lat]
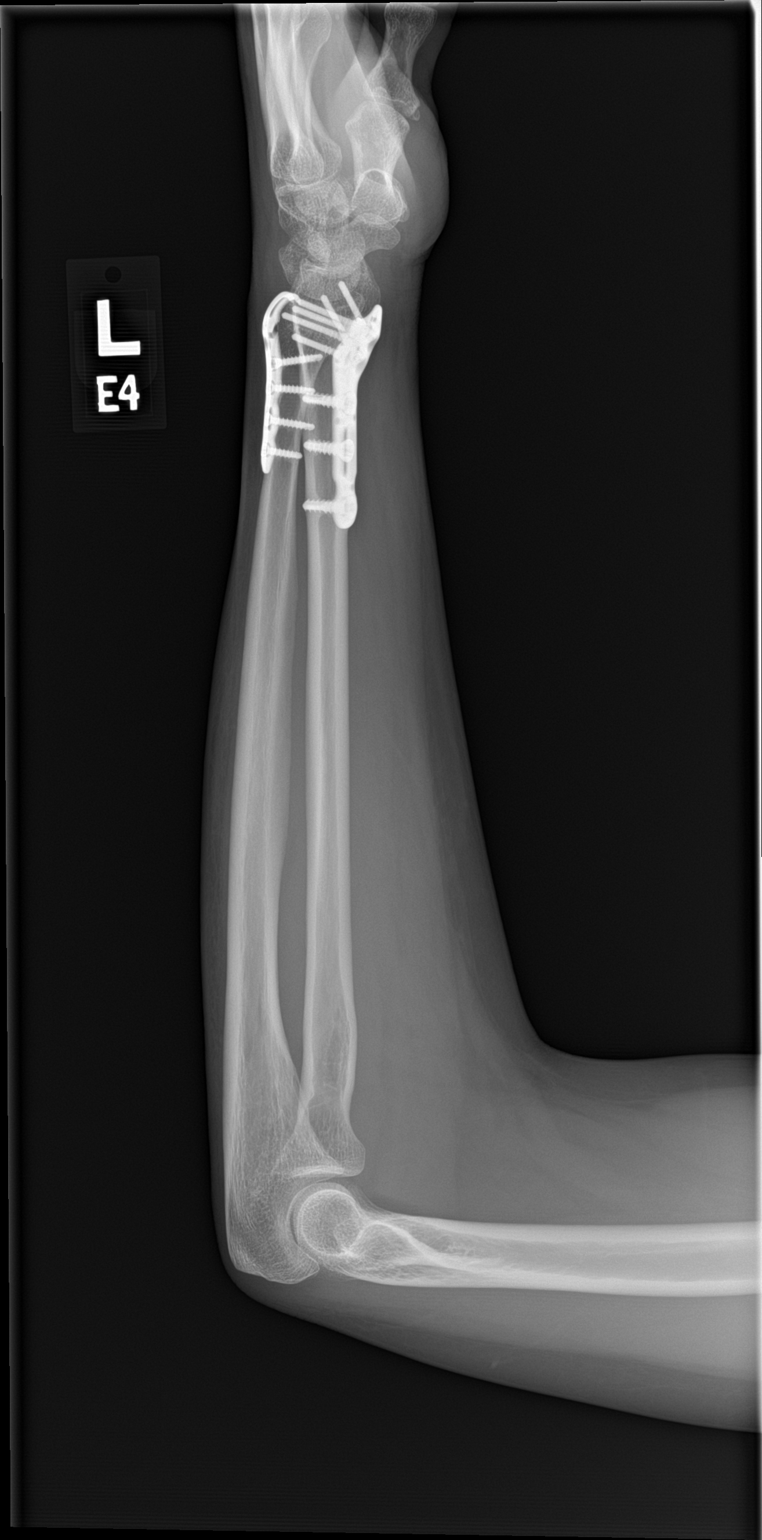

[2 of 2 positions shown; findings below may reference images not displayed]

FINDINGS: Prior internal fixation of distal radius and ulna. The sideplate
fixation hardware and screws appear intact. There is no acute
fracture or dislocation. The bones are well mineralized. No
arthritic changes. The soft tissues unremarkable.
IMPRESSION: No acute fracture or dislocation.

## 2023-03-11 ENCOUNTER — Ambulatory Visit (INDEPENDENT_AMBULATORY_CARE_PROVIDER_SITE_OTHER): Payer: PPO | Admitting: Family Medicine

## 2023-03-11 VITALS — BP 94/56 | HR 111 | Temp 98.0°F | Ht <= 58 in | Wt 97.6 lb

## 2023-03-11 DIAGNOSIS — H6121 Impacted cerumen, right ear: Secondary | ICD-10-CM

## 2023-03-11 NOTE — Progress Notes (Signed)
BP (!) 94/56 (BP Location: Left Arm, Patient Position: Sitting, Cuff Size: Normal)   Pulse (!) 111   Temp 98 F (36.7 C) (Oral)   Ht 4\' 7"  (1.397 m)   Wt 97 lb 9.6 oz (44.3 kg)   SpO2 100%   BMI 22.68 kg/m    Subjective:    Patient ID: Stacy Taylor, female    DOB: 06-Jul-1990, 32 y.o.   MRN: [id number]  HPI: Stacy Taylor is a 32 y.o. female  Chief Complaint  Patient presents with   clogged ears     Has been a problem for the last 2 days, has used ear wax removal, spiral clear tip, water bottle with the spiral tip to help flush ear    EAG CLOGGED Duration: 2 days Involved ear(s):  bilateral Sensation of feeling clogged/plugged: yes Decreased/muffled hearing:yes Ear pain: no Fever: no Otorrhea: no Hearing loss: no Upper respiratory infection symptoms: no Using Q-Tips: yes Status: worse History of cerumenosis: yes Treatments attempted: trying to flush them out  Relevant past medical, surgical, family and social history reviewed and updated as indicated. Interim medical history since our last visit reviewed. Allergies and medications reviewed and updated.  Review of Systems  Constitutional: Negative.   HENT:  Positive for ear pain. Negative for congestion, dental problem, drooling, ear discharge, facial swelling, hearing loss, mouth sores, nosebleeds, postnasal drip, rhinorrhea, sinus pressure, sinus pain, sneezing, sore throat, tinnitus, trouble swallowing and voice change.   Respiratory: Negative.    Cardiovascular: Negative.   Gastrointestinal: Negative.   Musculoskeletal: Negative.   Psychiatric/Behavioral: Negative.      Per HPI unless specifically indicated above     Objective:    BP (!) 94/56 (BP Location: Left Arm, Patient Position: Sitting, Cuff Size: Normal)   Pulse (!) 111   Temp 98 F (36.7 C) (Oral)   Ht 4\' 7"  (1.397 m)   Wt 97 lb 9.6 oz (44.3 kg)   SpO2 100%   BMI 22.68 kg/m   Wt Readings from Last 3 Encounters:  03/11/23 97 lb 9.6  oz (44.3 kg)  05/29/22 100 lb 12 oz (45.7 kg)  03/27/22 98 lb 3.2 oz (44.5 kg)    Physical Exam Vitals and nursing note reviewed.  Constitutional:      General: She is not in acute distress.    Appearance: Normal appearance. She is well-developed.  HENT:     Head: Normocephalic and atraumatic.     Right Ear: Hearing, ear canal and external ear normal. There is impacted cerumen.     Left Ear: Hearing, tympanic membrane, ear canal and external ear normal.     Nose: Nose normal.     Mouth/Throat:     Mouth: Mucous membranes are moist.     Pharynx: Oropharynx is clear.  Eyes:     General: Lids are normal. No scleral icterus.       Right eye: No discharge.        Left eye: No discharge.     Conjunctiva/sclera: Conjunctivae normal.  Pulmonary:     Effort: Pulmonary effort is normal. No respiratory distress.  Musculoskeletal:        General: Normal range of motion.  Skin:    Coloration: Skin is not jaundiced or pale.     Findings: No bruising, erythema, lesion or rash.  Neurological:     General: No focal deficit present.     Mental Status: She is alert and oriented to person, place, and time. Mental status is  at baseline.  Psychiatric:        Mood and Affect: Mood normal.        Speech: Speech normal.        Behavior: Behavior normal.        Thought Content: Thought content normal.        Judgment: Judgment normal.     Results for orders placed or performed during the hospital encounter of 02/02/23  Prolactin  Result Value Ref Range   Prolactin 8.8 4.8 - 33.4 ng/mL  Hemoglobin A1C  Result Value Ref Range   Hgb A1c MFr Bld 3.9 (L) 4.8 - 5.6 %   Mean Plasma Glucose 65.23 mg/dL      Assessment & Plan:   Problem List Items Addressed This Visit   None Visit Diagnoses     Hearing loss due to cerumen impaction, right    -  Primary   Ear flushed today with good results.        Follow up plan: Return if symptoms worsen or fail to improve.

## 2023-03-29 ENCOUNTER — Encounter: Payer: PPO | Admitting: Family Medicine

## 2023-04-13 DIAGNOSIS — F411 Generalized anxiety disorder: Secondary | ICD-10-CM | POA: Diagnosis not present

## 2023-04-13 DIAGNOSIS — R569 Unspecified convulsions: Secondary | ICD-10-CM | POA: Diagnosis not present

## 2023-04-13 DIAGNOSIS — H53149 Visual discomfort, unspecified: Secondary | ICD-10-CM | POA: Diagnosis not present

## 2023-04-13 DIAGNOSIS — G43109 Migraine with aura, not intractable, without status migrainosus: Secondary | ICD-10-CM | POA: Diagnosis not present

## 2023-04-13 DIAGNOSIS — R42 Dizziness and giddiness: Secondary | ICD-10-CM | POA: Diagnosis not present

## 2023-05-03 ENCOUNTER — Telehealth: Payer: PPO | Admitting: Psychiatry

## 2023-05-03 ENCOUNTER — Encounter: Payer: Self-pay | Admitting: Psychiatry

## 2023-05-03 DIAGNOSIS — F250 Schizoaffective disorder, bipolar type: Secondary | ICD-10-CM

## 2023-05-03 NOTE — Progress Notes (Signed)
 Virtual Visit via Video Note  I connected with Stacy Taylor on 05/03/23 at  1:30 PM EST by a video enabled telemedicine application and verified that I am speaking with the correct person using two identifiers.  Location Provider Location : [facility] Patient Location : Car  Participants: Patient , Provider    I discussed the limitations of evaluation and management by telemedicine and the availability of in person appointments. The patient expressed understanding and agreed to proceed.   I discussed the assessment and treatment plan with the patient. The patient was provided an opportunity to ask questions and all were answered. The patient agreed with the plan and demonstrated an understanding of the instructions.   The patient was advised to call back or seek an in-person evaluation if the symptoms worsen or if the condition fails to improve as anticipated.    BH MD OP Progress Note  05/03/2023 1:58 PM Stacy Taylor  MRN:  [id number]  Chief Complaint:  Chief Complaint  Patient presents with   Follow-up   Depression   Hallucinations   Medication Refill   HPI: Stacy Taylor is a 33 year old female, lives in [place], married, has a history of schizoaffective disorder, migraine headaches, seizure-like spells was evaluated by telemedicine today.  The patient, with a known history of schizoaffective disorder, reports overall improvement in her mental health since moving out of [place] . She has been maintaining regular contact with her parents, who have also noticed a significant positive change. The patient continues to experience continuous auditory and visual hallucinations, described as whispers and images of people, respectively. However, these do not bother the patient or affect her functioning at work or in her personal life.  The patient's sleep pattern has been good, with an increase in sleep duration noted recently. She reports a stable appetite and weight. There was a  single episode of dizziness, which occurred at work when changing position rapidly. This was associated with ringing in the ears and visual disturbances, described as seeing 'fireflies.'  The patient denies any recent thoughts of self-harm. Her seizure disorder has been well-controlled, with no recent episodes. The patient is currently on Seroquel  50 mg, Buspar , and Gabapentin , with no recent changes to her medication regimen. She expresses a desire to maintain the current dosage of Seroquel  due to past experiences of backfiring symptoms when the dosage was reduced too quickly.  The patient's overall mood and outlook appear positive, with satisfaction expressed regarding her job and personal life. She has been actively involved in caring for a recently rescued kitten, demonstrating engagement in activities outside of work.    Visit Diagnosis:    ICD-10-CM   1. Schizoaffective disorder, bipolar type (HCC)  F25.0       Past Psychiatric History: I have reviewed past psychiatric history from progress note on 08/08/2021.  Past trials of medications like Zyprexa, gabapentin , multiple others.    Past Medical History:  Past Medical History:  Diagnosis Date   Anxiety    Asthma    Depression    Fetal alcohol syndrome    Migraines    Osteoporosis    Scoliosis    Seizures (HCC)     Past Surgical History:  Procedure Laterality Date   FRACTURE SURGERY     HAND SURGERY     TONSILECTOMY, ADENOIDECTOMY, BILATERAL MYRINGOTOMY AND TUBES     WRIST SURGERY      Family Psychiatric History: I have reviewed family psychiatric history from progress note on 08/08/2021.  Family History:  Family  History  Problem Relation Age of Onset   Drug abuse Other    Mental illness Other     Social History: I have reviewed social history from progress note on 08/08/2021. Social History   Socioeconomic History   Marital status: Married    Spouse name: Not on file   Number of children: Not on file   Years  of education: Not on file   Highest education level: GED or equivalent  Occupational History   Not on file  Tobacco Use   Smoking status: Former    Types: Cigarettes   Smokeless tobacco: Never   Tobacco comments:    1 cig per day reported 03/14/2020  Vaping Use   Vaping status: Some Days  Substance and Sexual Activity   Alcohol use: Yes    Comment: socially   Drug use: Never   Sexual activity: Yes    Birth control/protection: I.U.D.  Other Topics Concern   Not on file  Social History Narrative   ** Merged History Encounter **       Social Drivers of Health   Financial Resource Strain: Low Risk  (03/11/2023)   Overall Financial Resource Strain (CARDIA)    Difficulty of Paying Living Expenses: Not hard at all  Food Insecurity: Food Insecurity Present (03/11/2023)   Hunger Vital Sign    Worried About Running Out of Food in the Last Year: Sometimes true    Ran Out of Food in the Last Year: Sometimes true  Transportation Needs: No Transportation Needs (03/11/2023)   PRAPARE - Administrator, Civil Service (Medical): No    Lack of Transportation (Non-Medical): No  Physical Activity: Insufficiently Active (03/11/2023)   Exercise Vital Sign    Days of Exercise per Week: 3 days    Minutes of Exercise per Session: 40 min  Stress: No Stress Concern Present (03/11/2023)   [employer] of Occupational Health - Occupational Stress Questionnaire    Feeling of Stress : Only a little  Social Connections: Moderately Isolated (03/11/2023)   Social Connection and Isolation Panel [NHANES]    Frequency of Communication with Friends and Family: More than three times a week    Frequency of Social Gatherings with Friends and Family: More than three times a week    Attends Religious Services: Never    Database Administrator or Organizations: No    Attends Engineer, Structural: Not on file    Marital Status: Married    Allergies:  Allergies  Allergen Reactions    Ibuprofen     Abdominal pain, upset stomach    Metabolic Disorder Labs: Lab Results  Component Value Date   HGBA1C 3.9 (L) 02/02/2023   MPG 65.23 02/02/2023   Lab Results  Component Value Date   PROLACTIN 8.8 02/02/2023   PROLACTIN 5.8 02/14/2021   Lab Results  Component Value Date   CHOL 150 03/27/2022   TRIG 36 03/27/2022   HDL 64 03/27/2022   LDLCALC 77 03/27/2022   LDLCALC 77 02/14/2021   Lab Results  Component Value Date   TSH 0.830 03/27/2022   TSH 0.709 02/14/2021    Therapeutic Level Labs: No results found for: LITHIUM No results found for: VALPROATE No results found for: CBMZ  Current Medications: Current Outpatient Medications  Medication Sig Dispense Refill   albuterol  (VENTOLIN  HFA) 108 (90 Base) MCG/ACT inhaler Inhale 2 puffs into the lungs every 4 (four) hours as needed for wheezing or shortness of breath. 8 g 12  budesonide -formoterol  (SYMBICORT ) 160-4.5 MCG/ACT inhaler Inhale 2 puffs into the lungs 2 (two) times daily. 10.2 g 12   busPIRone  (BUSPAR ) 15 MG tablet Take 1 tablet (15 mg total) by mouth 2 (two) times daily. 180 tablet 1   gabapentin  (NEURONTIN ) 400 MG capsule Take 1 capsule (400 mg total) by mouth 3 (three) times daily. 270 capsule 0   levonorgestrel (LILETTA, 52 MG,) 19.5 MCG/DAY IUD IUD 1 each by Intrauterine route once.     nortriptyline (PAMELOR) 10 MG capsule Take 10 mg by mouth every morning.     nortriptyline (PAMELOR) 50 MG capsule Take 50 mg by mouth at bedtime.     ondansetron  (ZOFRAN -ODT) 4 MG disintegrating tablet Take 1 tablet (4 mg total) by mouth every 8 (eight) hours as needed for nausea or vomiting. 20 tablet 0   QUEtiapine  (SEROQUEL ) 25 MG tablet Take 2 tablets (50 mg total) by mouth at bedtime. 180 tablet 1   topiramate (TOPAMAX) 100 MG tablet Take 1 tablet by mouth at bedtime.     Vitamin D , Ergocalciferol , (DRISDOL ) 1.25 MG (50000 UNIT) CAPS capsule Take 1 capsule (50,000 Units total) by mouth once a week. 12  capsule 1   zoledronic  acid (RECLAST ) 5 MG/100ML SOLN injection Inject 5 mg into the vein once. Once yearly     zolmitriptan (ZOMIG) 5 MG tablet Take 5 mg by mouth as needed for migraine.     No current facility-administered medications for this visit.     Musculoskeletal: Strength & Muscle Tone:  UTA Gait & Station:  Seated Patient leans: N/A  Psychiatric Specialty Exam: Review of Systems  Psychiatric/Behavioral:  Positive for hallucinations.     There were no vitals taken for this visit.There is no height or weight on file to calculate BMI.  General Appearance: Fairly Groomed  Eye Contact:  Fair  Speech:  Clear and Coherent  Volume:  Normal  Mood:  Euthymic  Affect:  Appropriate  Thought Process:  Goal Directed and Descriptions of Associations: Intact  Orientation:  Full (Time, Place, and Person)  Thought Content: Hallucinations: Auditory Visual chronic-does not bother her.  Suicidal Thoughts:  No  Homicidal Thoughts:  No  Memory:  Immediate;   Fair Recent;   Fair Remote;   Fair  Judgement:  Fair  Insight:  Fair  Psychomotor Activity:  Normal  Concentration:  Concentration: Fair and Attention Span: Fair  Recall:  [employer] of Knowledge: Fair  Language: Fair  Akathisia:  No  Handed:  Right  AIMS (if indicated): not done  Assets:  Communication Skills Desire for Improvement Housing Social Support  ADL's:  Intact  Cognition: WNL  Sleep:  Fair   Screenings: Geneticist, Molecular Office Visit from 02/02/2023 in [facility] Office Visit from 05/13/2022 in [facility] Office Visit from 10/29/2021 in [facility] Video Visit from 08/08/2021 in [facility]  AIMS Total Score 0 0 0 0      AUDIT    Flowsheet Row Clinical Support from 01/05/2022 in [facility]  Alcohol Use Disorder  Identification Test Final Score (AUDIT) 4      GAD-7    Flowsheet Row Office Visit from 02/02/2023 in [facility] Office Visit from 05/13/2022 in [facility] Office Visit from 03/27/2022 in [facility] Office Visit from 12/01/2021 in [facility] Office Visit from 08/29/2021 in [facility]  Total GAD-7 Score 1 0 0 0 5      PHQ2-9    Flowsheet Row Office Visit from 02/02/2023 in [facility] Video Visit from 10/07/2022 in [facility] Office Visit from 05/13/2022 in [facility] Office Visit from 03/27/2022 in [facility] Clinical Support from 01/05/2022 in [facility]  PHQ-2 Total Score 0 0 0 0 0  PHQ-9 Total Score -- -- 0 0 4      Flowsheet Row Video Visit from 05/03/2023 in [facility] Office Visit from 02/02/2023 in [facility] Video Visit from 10/07/2022 in [facility]  C-SSRS RISK CATEGORY Moderate Risk Low Risk No Risk        Assessment and Plan: Stacy Taylor is a 33 year old Caucasian female, married, on SSI, lives in [place], has a history of schizoaffective disorder, currently stable on current medication regimen, tolerating the lower dosage of Seroquel , discussed assessment and plan as noted below.   Schizoaffective Disorder-stable Condition is well-managed. Reports positive feedback from family. Hallucinations (whispers and visual images) are continuous but do not affect functioning. No recent thoughts of self-harm. Sleep and appetite are good. Prefers to maintain current Seroquel  50 mg regimen to avoid destabilization. Discussed risks of rapid  medication reduction. - Continue Seroquel  50 mg - Continue BuSpar  15 mg twice daily. - Also on gabapentin  as well as nortriptyline prescribed for pain although they are beneficial with her mood symptoms as well. - Follow up in 3 months  Seizure Disorder Well-controlled with no recent seizures. On gabapentin  with a 90-day supply. - Continue current medication regimen - Monitor for any seizure activity  I have reviewed labs and discussed with patient-hemoglobin A1c on 02/02/2023, prolactin level-within normal limits.   General Health Maintenance Blood work shows good prolactin levels and slightly low hemoglobin A1c. No significant weight changes. Primary care appointment scheduled in February for a physical. - Attend primary care appointment in February for physical - Discuss dizziness with primary care physician  Follow-up - Schedule follow-up appointment on April 24th at 4:40 PM for a virtual visit.  Collaboration of Care: Collaboration of Care: Primary Care Provider AEB patient and patient to follow up with primary care provider.  Patient/Guardian was advised Release of Information must be obtained prior to any record release in order to collaborate their care with an outside provider. Patient/Guardian was advised if they have not already done so to contact the registration department to sign all necessary forms in order for us  to release information regarding their care.   Consent: Patient/Guardian gives verbal consent for treatment and assignment of benefits for services provided during this visit. Patient/Guardian expressed understanding and agreed to proceed.   This note was generated in part or whole with voice recognition software. Voice recognition is usually quite accurate but there are transcription errors that can and very often do occur. I apologize for any typographical errors that were not detected and corrected.    Lejon Afzal, MD 05/03/2023, 1:58 PM

## 2023-06-18 ENCOUNTER — Encounter: Payer: Self-pay | Admitting: Nurse Practitioner

## 2023-06-18 ENCOUNTER — Ambulatory Visit: Payer: Self-pay | Admitting: Family Medicine

## 2023-06-18 ENCOUNTER — Ambulatory Visit (INDEPENDENT_AMBULATORY_CARE_PROVIDER_SITE_OTHER): Payer: PPO | Admitting: Nurse Practitioner

## 2023-06-18 ENCOUNTER — Encounter: Payer: Self-pay | Admitting: Family Medicine

## 2023-06-18 VITALS — BP 102/65 | HR 97 | Temp 98.1°F | Ht <= 58 in | Wt 93.0 lb

## 2023-06-18 DIAGNOSIS — R42 Dizziness and giddiness: Secondary | ICD-10-CM

## 2023-06-18 MED ORDER — ONDANSETRON 4 MG PO TBDP: 4.0000 mg | ORAL_TABLET | Freq: Three times a day (TID) | ORAL | 0 refills | Status: DC | PRN

## 2023-06-18 MED ORDER — PREDNISONE 20 MG PO TABS: 40.0000 mg | ORAL_TABLET | Freq: Every day | ORAL | 0 refills | Status: AC

## 2023-06-18 MED ORDER — MECLIZINE HCL 12.5 MG PO TABS: 12.5000 mg | ORAL_TABLET | Freq: Two times a day (BID) | ORAL | 0 refills | Status: DC | PRN

## 2023-06-18 NOTE — Progress Notes (Signed)
BP 102/65   Pulse 97   Temp 98.1 F (36.7 C) (Oral)   Ht 4\' 7"  (1.397 m)   Wt 93 lb (42.2 kg)   SpO2 98%   BMI 21.62 kg/m    Subjective:    Patient ID: Stacy Taylor, female    DOB: [birth year], 33 y.o.   MRN: [id number]  HPI: Stacy Taylor is a 33 y.o. female  Chief Complaint  Patient presents with   Dizziness    Patient states she has been experiencing dizziness for the last few months. States she was tolerating it until yesterday when she started feeling constant dizziness. States she woke up with a migraine morning, which got better, but the dizziness remained. States she has been experiencing ringing in her ears with this as well.     DIZZINESS Has been having dizziness for a few months, yesterday it became worse and she passed out at work, no injuries.  Continues today, will not go away.  Woke-up with migraine yesterday morning.  Normally she can function with dizziness, but this is worse.  No previous episodes of same prior to 3 months ago - although on review of records had some dizziness in December 2023 -- she does endorse getting dizziness with migraines at times, but this is different. During current episodes hands and feet get numb and tingly + currently her hands are ice cold.  Does follow with neurology for migraines, last visit 04/13/23.  Recently saw psychiatry and reports he hemoglobin was low there.  Does not have menstrual cycles, on birth control. Duration: months Description of symptoms: lightheaded and room spinning Duration of episode: hours Dizziness frequency: no history of the same Provoking factors: getting up from sitting, anytime on feet it happens Aggravating factors:  none Triggered by rolling over in bed: no Triggered by bending over: yes Aggravated by head movement: no Aggravated by exertion, coughing, loud noises: no Recent head injury: no Recent or current viral symptoms: no History of vasovagal episodes: no Nausea: yes Vomiting:  no Tinnitus: yes like a white noise sound/muffled Hearing loss: no Aural fullness: no Headache: has migraines at baseline Photophobia/phonophobia: no Unsteady gait: yes Postural instability: no Diplopia, dysarthria, dysphagia or weakness: no Related to exertion: no Pallor: no Diaphoresis: no Dyspnea: no Chest pain: no  Palpitations: heart feels a little faster during episodes at times  Relevant past medical, surgical, family and social history reviewed and updated as indicated. Interim medical history since our last visit reviewed. Allergies and medications reviewed and updated.  Review of Systems  Constitutional:  Negative for activity change, appetite change, diaphoresis, fatigue and fever.  Respiratory:  Negative for cough, chest tightness and shortness of breath.   Cardiovascular:  Negative for chest pain, palpitations and leg swelling.  Gastrointestinal:  Positive for nausea. Negative for abdominal pain, constipation, diarrhea and vomiting.  Neurological:  Positive for dizziness, light-headedness and headaches. Negative for syncope, facial asymmetry, speech difficulty, weakness and numbness.  Psychiatric/Behavioral: Negative.      Per HPI unless specifically indicated above     Objective:    BP 102/65   Pulse 97   Temp 98.1 F (36.7 C) (Oral)   Ht 4\' 7"  (1.397 m)   Wt 93 lb (42.2 kg)   SpO2 98%   BMI 21.62 kg/m   Wt Readings from Last 3 Encounters:  06/18/23 93 lb (42.2 kg)  03/11/23 97 lb 9.6 oz (44.3 kg)  05/29/22 100 lb 12 oz (45.7 kg)    Physical Exam  Vitals and nursing note reviewed.  Constitutional:      General: She is awake. She is not in acute distress.    Appearance: She is well-developed and well-groomed. She is not ill-appearing or toxic-appearing.  HENT:     Head: Normocephalic.     Right Ear: Hearing, ear canal and external ear normal. A middle ear effusion is present. There is no impacted cerumen. Tympanic membrane is not injected.     Left  Ear: Hearing and external ear normal. A middle ear effusion (able to view on exam after irrigation) is present. There is impacted cerumen. Tympanic membrane is not injected.     Ears:     Comments: Left ear irrigated by CMA with overall moderate clearance, scant cerumen left.  Able to view TM.  Tolerated procedure well.    Nose: Nose normal. No rhinorrhea.     Right Sinus: No maxillary sinus tenderness or frontal sinus tenderness.     Left Sinus: No maxillary sinus tenderness or frontal sinus tenderness.     Mouth/Throat:     Mouth: Mucous membranes are moist.     Pharynx: No pharyngeal swelling, oropharyngeal exudate or posterior oropharyngeal erythema.  Eyes:     General: Lids are normal.        Right eye: No discharge.        Left eye: No discharge.     Conjunctiva/sclera: Conjunctivae normal.     Pupils: Pupils are equal, round, and reactive to light.  Neck:     Thyroid: No thyromegaly.     Vascular: No carotid bruit.  Cardiovascular:     Rate and Rhythm: Normal rate and regular rhythm.     Heart sounds: Normal heart sounds. No murmur heard.    No gallop.  Pulmonary:     Effort: Pulmonary effort is normal. No accessory muscle usage or respiratory distress.     Breath sounds: Normal breath sounds.  Abdominal:     General: Bowel sounds are normal. There is no distension.     Palpations: Abdomen is soft.     Tenderness: There is no abdominal tenderness.  Musculoskeletal:     Cervical back: Normal range of motion and neck supple.     Right lower leg: No edema.     Left lower leg: No edema.  Lymphadenopathy:     Head:     Right side of head: No submental, submandibular, tonsillar, preauricular or posterior auricular adenopathy.     Left side of head: No submental, submandibular, tonsillar, preauricular or posterior auricular adenopathy.     Cervical: No cervical adenopathy.  Skin:    General: Skin is warm and dry.  Neurological:     Mental Status: She is alert and oriented to  person, place, and time.     Cranial Nerves: Cranial nerves 2-12 are intact.     Motor: Motor function is intact.     Coordination: Romberg sign positive (some unsteadiness). Finger-Nose-Finger Test and Heel to [place] Test normal.     Gait: Gait is intact.     Deep Tendon Reflexes: Reflexes are normal and symmetric.     Reflex Scores:      Brachioradialis reflexes are 2+ on the right side and 2+ on the left side.      Patellar reflexes are 2+ on the right side and 2+ on the left side. Psychiatric:        Attention and Perception: Attention normal.        Mood and Affect:  Mood normal.        Speech: Speech normal.        Behavior: Behavior normal. Behavior is cooperative.        Thought Content: Thought content normal.    Results for orders placed or performed during the hospital encounter of 02/02/23  Prolactin   Collection Time: 02/02/23  2:47 PM  Result Value Ref Range   Prolactin 8.8 4.8 - 33.4 ng/mL  Hemoglobin A1C   Collection Time: 02/02/23  2:47 PM  Result Value Ref Range   Hgb A1c MFr Bld 3.9 (L) 4.8 - 5.6 %   Mean Plasma Glucose 65.23 mg/dL      Assessment & Plan:   Problem List Items Addressed This Visit       Other   Dizziness - Primary   Ongoing for 3 months, with recent worsening.  Slight off balance with Romberg and left ear with cerumen impaction + effusion both ears.  Suspect dizziness is currently related to inner ear.  No red flags on exam.  Will start Prednisone 40 MG daily for 5 days.  Meclizine as needed for dizziness, not to use if driving or using heavy equipment -- discussed with her.  Refills on Zofran sent.  Left ear irrigated by CMA with moderate clearance.  Obtain labs today, including CBC/iron/ferritin due to her psychiatrist reporting to recent low H/H.  Plan for return in one week, sooner if symptoms worsen.  If no improvement and stable labs will consider imaging and recommend follow-up with neurology.      Relevant Medications   ondansetron  (ZOFRAN-ODT) 4 MG disintegrating tablet   Other Relevant Orders   TSH   Comprehensive metabolic panel   CBC with Differential/Platelet   Ferritin   Iron    Time: 25 minutes, >50% spent counseling/or care coordination   Follow up plan: Return in about 1 week (around 06/25/2023) for Dizziness.

## 2023-06-18 NOTE — Patient Instructions (Signed)
 Dizziness Dizziness is a common problem. It makes you feel unsteady or light-headed. You may feel like you're about to faint. Dizziness can lead to getting hurt if you stumble or fall. It's more common to feel dizzy if you're an older adult. Many things can cause you to feel dizzy. These include: Medicines. Dehydration. This is when there's not enough water in your body. Illness. Follow these instructions at home: Eating and drinking  Drink enough fluid to keep your pee (urine) pale yellow. This helps keep you from getting dehydrated. Try to drink more clear fluids, such as water. Do not drink alcohol. Try to limit how much caffeine you take in. Try to limit how much salt, also called sodium, you take in. Activity Try not to make quick movements. Stand up slowly from sitting in a chair. Steady yourself until you feel okay. In the morning, first sit up on the side of the bed. When you feel okay, hold onto something and slowly stand up. Do this until you know that your balance is okay. If you need to stand in one place for a long time, move your legs often. Tighten and relax the muscles in your legs while you're standing. Do not drive or use machines if you feel dizzy. Avoid bending down if you feel dizzy. Place items in your home so you can reach them without leaning over. Lifestyle Do not smoke, vape, or use products with nicotine or tobacco in them. If you need help quitting, talk with your health care provider. Try to lower your stress level. You can do this by using methods like yoga or meditation. Talk with your provider if you need help. General instructions Watch your dizziness for any changes. Take your medicines only as told by your provider. Talk with your provider if you think you're dizzy because of a medicine you're taking. Tell a friend or a family member that you're feeling dizzy. If they spot any changes in your behavior, have them call your provider. Contact a health care  provider if: Your dizziness doesn't go away, or you have new symptoms. Your dizziness gets worse. You feel like you may vomit. You have trouble hearing. You have a fever. You have neck pain or a stiff neck. You fall or get hurt. Get help right away if: You vomit each time you eat or drink. You have watery poop and can't eat or drink. You have trouble talking, walking, swallowing, or using your arms, hands, or legs. You feel very weak. You're bleeding. You're not thinking clearly, or you have trouble forming sentences. A friend or family member may spot this. Your vision changes, or you get a very bad headache. These symptoms may be an emergency. Call 911 right away. Do not wait to see if the symptoms will go away. Do not drive yourself to the hospital. This information is not intended to replace advice given to you by your health care provider. Make sure you discuss any questions you have with your health care provider. Document Revised: 01/14/2023 Document Reviewed: 05/28/2022 Elsevier Patient Education  2024 [employer].

## 2023-06-18 NOTE — Telephone Encounter (Signed)
  Chief Complaint: dizziness- comes and goes- almost fainted yesterday Symptoms: dizziness- almost fainted- only seconds Frequency: started in November Pertinent Negatives: Patient denies SOB, chest pain, fever Disposition: [] ED /[] Urgent Care (no appt availability in office) / [] Appointment(In office/virtual)/ []  [facility]/ [] Home Care/ [] Refused Recommended Disposition /[] [facility]/ []  Follow-up with PCP Additional Notes: Patient states she has had episodes of dizziness/faintness since November. Possible low Hgb  Copied from CRM [id number]. Topic: Clinical - Red Word Triage >> Jun 18, 2023  8:04 AM Geroge Baseman wrote: Red Word that prompted transfer to Nurse Triage: 100/65 blood pressure, keeps getting very dizzy and passed out at work yesterday. Hemoglobin showed very low at last blood check. Reason for Disposition  [1] MODERATE dizziness (e.g., interferes with normal activities) AND [2] has NOT been evaluated by doctor (or NP/PA) for this  (Exception: Dizziness caused by heat exposure, sudden standing, or poor fluid intake.)  Answer Assessment - Initial Assessment Questions 1. DESCRIPTION: "Describe your dizziness."     Had to sit at work due to dizziness- felt faint, most recent hgb low 2. LIGHTHEADED: "Do you feel lightheaded?" (e.g., somewhat faint, woozy, weak upon standing)     Consistent fog 3. VERTIGO: "Do you feel like either you or the room is spinning or tilting?" (i.e. vertigo)     no 4. SEVERITY: "How bad is it?"  "Do you feel like you are going to faint?" "Can you stand and walk?"   - MILD: Feels slightly dizzy, but walking normally.   - MODERATE: Feels unsteady when walking, but not falling; interferes with normal activities (e.g., school, work).   - SEVERE: Unable to walk without falling, or requires assistance to walk without falling; feels like passing out now.      Can walk normal- comes and goes- can feel it coming 5. ONSET:  "When did the  dizziness begin?"     Couple months- beginning November 6. AGGRAVATING FACTORS: "Does anything make it worse?" (e.g., standing, change in head position)     No- when it happens- has to stop 7. HEART RATE: "Can you tell me your heart rate?" "How many beats in 15 seconds?"  (Note: not all patients can do this)       100/65, 106 8. CAUSE: "What do you think is causing the dizziness?"     Low hgb 9. RECURRENT SYMPTOM: "Have you had dizziness before?" If Yes, ask: "When was the last time?" "What happened that time?"     A little- not this bad 10. OTHER SYMPTOMS: "Do you have any other symptoms?" (e.g., fever, chest pain, vomiting, diarrhea, bleeding)       Some fatigue  Protocols used: Dizziness - Lightheadedness-A-AH

## 2023-06-18 NOTE — Assessment & Plan Note (Addendum)
Ongoing for 3 months, with recent worsening.  Slight off balance with Romberg and left ear with cerumen impaction + effusion both ears.  Suspect dizziness is currently related to inner ear.  No red flags on exam.  Will start Prednisone 40 MG daily for 5 days.  Meclizine as needed for dizziness, not to use if driving or using heavy equipment -- discussed with her.  Refills on Zofran sent.  Left ear irrigated by CMA with moderate clearance.  Obtain labs today, including CBC/iron/ferritin due to her psychiatrist reporting to recent low H/H.  Plan for return in one week, sooner if symptoms worsen.  If no improvement and stable labs will consider imaging and recommend follow-up with neurology.

## 2023-06-19 ENCOUNTER — Encounter: Payer: Self-pay | Admitting: Nurse Practitioner

## 2023-06-19 LAB — CBC WITH DIFFERENTIAL/PLATELET
Basophils Absolute: 0.1 10*3/uL (ref 0.0–0.2)
Basos: 1 %
EOS (ABSOLUTE): 0.1 10*3/uL (ref 0.0–0.4)
Eos: 1 %
Hematocrit: 40.1 % (ref 34.0–46.6)
Hemoglobin: 13.6 g/dL (ref 11.1–15.9)
Immature Grans (Abs): 0 10*3/uL (ref 0.0–0.1)
Immature Granulocytes: 0 %
Lymphocytes Absolute: 2.4 10*3/uL (ref 0.7–3.1)
Lymphs: 36 %
MCH: 33.1 pg — ABNORMAL HIGH (ref 26.6–33.0)
MCHC: 33.9 g/dL (ref 31.5–35.7)
MCV: 98 fL — ABNORMAL HIGH (ref 79–97)
Monocytes Absolute: 0.3 10*3/uL (ref 0.1–0.9)
Monocytes: 5 %
Neutrophils Absolute: 3.7 10*3/uL (ref 1.4–7.0)
Neutrophils: 57 %
Platelets: 157 10*3/uL (ref 150–450)
RBC: 4.11 x10E6/uL (ref 3.77–5.28)
RDW: 12.2 % (ref 11.7–15.4)
WBC: 6.5 10*3/uL (ref 3.4–10.8)

## 2023-06-19 LAB — IRON: Iron: 77 ug/dL (ref 27–159)

## 2023-06-19 LAB — COMPREHENSIVE METABOLIC PANEL
ALT: 9 [IU]/L (ref 0–32)
AST: 14 [IU]/L (ref 0–40)
Albumin: 4.6 g/dL (ref 3.9–4.9)
Alkaline Phosphatase: 50 [IU]/L (ref 44–121)
BUN/Creatinine Ratio: 16 (ref 9–23)
BUN: 16 mg/dL (ref 6–20)
Bilirubin Total: 0.5 mg/dL (ref 0.0–1.2)
CO2: 18 mmol/L — ABNORMAL LOW (ref 20–29)
Calcium: 8.9 mg/dL (ref 8.7–10.2)
Chloride: 108 mmol/L — ABNORMAL HIGH (ref 96–106)
Creatinine, Ser: 0.98 mg/dL (ref 0.57–1.00)
Globulin, Total: 2.3 g/dL (ref 1.5–4.5)
Glucose: 83 mg/dL (ref 70–99)
Potassium: 3.7 mmol/L (ref 3.5–5.2)
Sodium: 142 mmol/L (ref 134–144)
Total Protein: 6.9 g/dL (ref 6.0–8.5)
eGFR: 78 mL/min/{1.73_m2} (ref >59)

## 2023-06-19 LAB — FERRITIN: Ferritin: 148 ng/mL (ref 15–150)

## 2023-06-19 LAB — TSH: TSH: 0.817 u[IU]/mL (ref 0.450–4.500)

## 2023-06-19 NOTE — Progress Notes (Signed)
 Contacted via MyChart   Good evening Stacy Taylor, your labs have returned and overall are stable.  No findings to explain dizziness here.  I do suspect this is more inner ear related.  How are you feeling? Keep being awesome!!  Thank you for allowing me to participate in your care.  I appreciate you. Kindest regards, Oden Lindaman

## 2023-06-21 MED ORDER — MECLIZINE HCL 12.5 MG PO TABS: 12.5000 mg | ORAL_TABLET | Freq: Three times a day (TID) | ORAL | 0 refills | Status: DC | PRN

## 2023-06-21 NOTE — Addendum Note (Signed)
 Addended by: Aura Dials T on: 06/21/2023 09:06 AM   Modules accepted: Orders

## 2023-06-25 ENCOUNTER — Encounter: Payer: Self-pay | Admitting: Family Medicine

## 2023-06-25 ENCOUNTER — Ambulatory Visit (INDEPENDENT_AMBULATORY_CARE_PROVIDER_SITE_OTHER): Payer: PPO | Admitting: Family Medicine

## 2023-06-25 VITALS — BP 102/70 | HR 93 | Temp 98.3°F | Resp 17 | Wt 94.2 lb

## 2023-06-25 DIAGNOSIS — R42 Dizziness and giddiness: Secondary | ICD-10-CM | POA: Diagnosis not present

## 2023-06-25 DIAGNOSIS — H6121 Impacted cerumen, right ear: Secondary | ICD-10-CM

## 2023-06-25 NOTE — Assessment & Plan Note (Signed)
 Appointment for vestibular rehab scheduled for Monday. ?vestibular migraine- sample of nurtec given today. If not starting to do a bit better by Tuesday, will get MRI w/wo of her brain given abnormal eye movements, history of seizures and dizziness. Continue to monitor closely. Call with any concerns.

## 2023-06-25 NOTE — Patient Instructions (Signed)
 [facility] Physical therapy [address] Cathleen Fears [place], [place] [address]  Monday 06/28/23 12 PM

## 2023-06-25 NOTE — Progress Notes (Signed)
 BP 102/70 (BP Location: Left Arm, Patient Position: Sitting, Cuff Size: Normal)   Pulse 93   Temp 98.3 F (36.8 C) (Oral)   Resp 17   Wt 94 lb 3.2 oz (42.7 kg)   SpO2 100%   BMI 21.89 kg/m    Subjective:    Patient ID: Stacy Taylor, female    DOB: [birth year], 33 y.o.   MRN: [id number]  HPI: Stacy Taylor is a 33 y.o. female  Chief Complaint  Patient presents with   Dizziness    Still unable to drive. No word on ENT referral. Feels its gotten worse.    DIZZINESS Duration: months- worse in the last week Description of symptoms: room spinning Duration of episode: minutes Dizziness frequency: recurrent Provoking factors: changing positions Triggered by rolling over in bed: no Triggered by bending over: yes Aggravated by head movement: yes Aggravated by exertion, coughing, loud noises: yes Recent head injury: no Recent or current viral symptoms: no History of vasovagal episodes: yes- last Thursday, was sitting, happened really suddenly Nausea: yes Vomiting: no Tinnitus: yes Hearing loss: no Aural fullness: yes Headache: yes- stable Photophobia/phonophobia: no Unsteady gait: yes Postural instability: yes Diplopia, dysarthria, dysphagia or weakness: no Related to exertion: yes Pallor: no Diaphoresis: no Dyspnea: no Chest pain: no  Relevant past medical, surgical, family and social history reviewed and updated as indicated. Interim medical history since our last visit reviewed. Allergies and medications reviewed and updated.  Review of Systems  Constitutional: Negative.   Respiratory: Negative.    Cardiovascular: Negative.   Musculoskeletal: Negative.   Skin: Negative.   Neurological:  Positive for dizziness and light-headedness. Negative for tremors, seizures, syncope, facial asymmetry, speech difficulty, weakness, numbness and headaches.  Psychiatric/Behavioral: Negative.      Per HPI unless specifically indicated above     Objective:    BP  102/70 (BP Location: Left Arm, Patient Position: Sitting, Cuff Size: Normal)   Pulse 93   Temp 98.3 F (36.8 C) (Oral)   Resp 17   Wt 94 lb 3.2 oz (42.7 kg)   SpO2 100%   BMI 21.89 kg/m   Wt Readings from Last 3 Encounters:  06/25/23 94 lb 3.2 oz (42.7 kg)  06/18/23 93 lb (42.2 kg)  03/11/23 97 lb 9.6 oz (44.3 kg)    Physical Exam Vitals and nursing note reviewed.  Constitutional:      General: She is not in acute distress.    Appearance: Normal appearance. She is normal weight. She is not ill-appearing, toxic-appearing or diaphoretic.  HENT:     Head: Normocephalic and atraumatic.     Right Ear: Tympanic membrane, ear canal and external ear normal.     Left Ear: External ear normal. There is impacted cerumen.     Nose: Nose normal. No congestion or rhinorrhea.     Mouth/Throat:     Mouth: Mucous membranes are moist.     Pharynx: Oropharynx is clear. No oropharyngeal exudate or posterior oropharyngeal erythema.  Eyes:     General: No scleral icterus.       Right eye: No discharge.        Left eye: No discharge.     Extraocular Movements: Extraocular movements intact.     Conjunctiva/sclera: Conjunctivae normal.     Pupils: Pupils are equal, round, and reactive to light.     Comments: + nystagmus to the L, abnormal eye movements with looking L and up- eyes drop down and have downward nystgmus  Cardiovascular:  Rate and Rhythm: Normal rate and regular rhythm.     Pulses: Normal pulses.     Heart sounds: Normal heart sounds. No murmur heard.    No friction rub. No gallop.  Pulmonary:     Effort: Pulmonary effort is normal. No respiratory distress.     Breath sounds: Normal breath sounds. No stridor. No wheezing, rhonchi or rales.  Chest:     Chest wall: No tenderness.  Musculoskeletal:        General: Normal range of motion.     Cervical back: Normal range of motion and neck supple.  Skin:    General: Skin is warm and dry.     Capillary Refill: Capillary refill takes  less than 2 seconds.     Coloration: Skin is not jaundiced or pale.     Findings: No bruising, erythema, lesion or rash.  Neurological:     General: No focal deficit present.     Mental Status: She is alert and oriented to person, place, and time. Mental status is at baseline.  Psychiatric:        Mood and Affect: Mood normal.        Behavior: Behavior normal.        Thought Content: Thought content normal.        Judgment: Judgment normal.     Results for orders placed or performed in visit on 06/18/23  TSH   Collection Time: 06/18/23 11:22 AM  Result Value Ref Range   TSH 0.817 0.450 - 4.500 uIU/mL  Comprehensive metabolic panel   Collection Time: 06/18/23 11:22 AM  Result Value Ref Range   Glucose 83 70 - 99 mg/dL   BUN 16 6 - 20 mg/dL   Creatinine, Ser 1.61 0.57 - 1.00 mg/dL   eGFR 78 >09 UE/AVW/0.98   BUN/Creatinine Ratio 16 9 - 23   Sodium 142 134 - 144 mmol/L   Potassium 3.7 3.5 - 5.2 mmol/L   Chloride 108 (H) 96 - 106 mmol/L   CO2 18 (L) 20 - 29 mmol/L   Calcium 8.9 8.7 - 10.2 mg/dL   Total Protein 6.9 6.0 - 8.5 g/dL   Albumin 4.6 3.9 - 4.9 g/dL   Globulin, Total 2.3 1.5 - 4.5 g/dL   Bilirubin Total 0.5 0.0 - 1.2 mg/dL   Alkaline Phosphatase 50 44 - 121 IU/L   AST 14 0 - 40 IU/L   ALT 9 0 - 32 IU/L  CBC with Differential/Platelet   Collection Time: 06/18/23 11:22 AM  Result Value Ref Range   WBC 6.5 3.4 - 10.8 x10E3/uL   RBC 4.11 3.77 - 5.28 x10E6/uL   Hemoglobin 13.6 11.1 - 15.9 g/dL   Hematocrit 11.9 14.7 - 46.6 %   MCV 98 (H) 79 - 97 fL   MCH 33.1 (H) 26.6 - 33.0 pg   MCHC 33.9 31.5 - 35.7 g/dL   RDW 82.9 56.2 - 13.0 %   Platelets 157 150 - 450 x10E3/uL   Neutrophils 57 Not Estab. %   Lymphs 36 Not Estab. %   Monocytes 5 Not Estab. %   Eos 1 Not Estab. %   Basos 1 Not Estab. %   Neutrophils Absolute 3.7 1.4 - 7.0 x10E3/uL   Lymphocytes Absolute 2.4 0.7 - 3.1 x10E3/uL   Monocytes Absolute 0.3 0.1 - 0.9 x10E3/uL   EOS (ABSOLUTE) 0.1 0.0 - 0.4  x10E3/uL   Basophils Absolute 0.1 0.0 - 0.2 x10E3/uL   Immature Granulocytes 0 Not Estab. %  Immature Grans (Abs) 0.0 0.0 - 0.1 x10E3/uL  Ferritin   Collection Time: 06/18/23 11:22 AM  Result Value Ref Range   Ferritin 148 15 - 150 ng/mL  Iron   Collection Time: 06/18/23 11:22 AM  Result Value Ref Range   Iron 77 27 - 159 ug/dL      Assessment & Plan:   Problem List Items Addressed This Visit       Other   Dizziness - Primary   Appointment for vestibular rehab scheduled for Monday. ?vestibular migraine- sample of nurtec given today. If not starting to do a bit better by Tuesday, will get MRI w/wo of her brain given abnormal eye movements, history of seizures and dizziness. Continue to monitor closely. Call with any concerns.       Relevant Orders   Ambulatory referral to Physical Therapy   Ambulatory referral to ENT   Other Visit Diagnoses       Vertigo       Relevant Orders   Ambulatory referral to Physical Therapy   Ambulatory referral to ENT     Hearing loss due to cerumen impaction, right       Relevant Orders   Ambulatory referral to ENT        Follow up plan: Return in about 2 weeks (around 07/09/2023).

## 2023-06-28 ENCOUNTER — Telehealth: Payer: Self-pay

## 2023-06-28 DIAGNOSIS — R42 Dizziness and giddiness: Secondary | ICD-10-CM

## 2023-06-28 DIAGNOSIS — H81392 Other peripheral vertigo, left ear: Secondary | ICD-10-CM | POA: Diagnosis not present

## 2023-06-28 DIAGNOSIS — G43009 Migraine without aura, not intractable, without status migrainosus: Secondary | ICD-10-CM

## 2023-06-28 DIAGNOSIS — Z87898 Personal history of other specified conditions: Secondary | ICD-10-CM

## 2023-06-28 NOTE — Telephone Encounter (Signed)
 Copied from CRM [id number]. Topic: Clinical - Medical Advice >> Jun 28, 2023  1:24 PM Elle L wrote: Reason for CRM: The patient states she saw Dr. Laural Benes on 2/28 and was advised to let her know if Physical Therapy did not help her symptoms as she may order a MRI. The patient's Physical Therapist also advised her to request a doctor's note for the rest of the week in order to have Physical Therapy twice this week. The Physical Therapy did not help her symptoms. Her call back number is [phone].

## 2023-06-28 NOTE — Telephone Encounter (Signed)
 Forwarding to PCP to order imaging.   Care plan from last office visit: Appointment for vestibular rehab scheduled for Monday. ?vestibular migraine- sample of nurtec given today. If not starting to do a bit better by Tuesday, will get MRI w/wo of her brain given abnormal eye movements, history of seizures and dizziness. Continue to monitor closely. Call with any concerns.   Patient is also requesting a note for work to allow her to be off so she can complete her PT this week and also work on the home rehab portion. Was a recommendation by her PT to allow her to try and get some resolve.

## 2023-06-29 NOTE — Telephone Encounter (Signed)
 Patient is needing a following up call today regarding the below. Caller states the Dr. Note needs to refect her out of work starting tomorrow 06/30/2023 through 07/04/2023.

## 2023-06-30 NOTE — Telephone Encounter (Signed)
 Called to follow up with patient. Letter written in chart, unsure if letter was given to the patient or not. Patient states that it was and she is all good now.

## 2023-07-01 DIAGNOSIS — H81392 Other peripheral vertigo, left ear: Secondary | ICD-10-CM | POA: Diagnosis not present

## 2023-07-02 ENCOUNTER — Telehealth: Payer: Self-pay

## 2023-07-02 ENCOUNTER — Ambulatory Visit
Admission: RE | Admit: 2023-07-02 | Discharge: 2023-07-02 | Disposition: A | Discharge status: Home / Self Care | Source: Ambulatory Visit | Attending: Family Medicine | Admitting: Family Medicine

## 2023-07-02 DIAGNOSIS — Z87898 Personal history of other specified conditions: Secondary | ICD-10-CM

## 2023-07-02 DIAGNOSIS — R42 Dizziness and giddiness: Secondary | ICD-10-CM

## 2023-07-02 DIAGNOSIS — G40909 Epilepsy, unspecified, not intractable, without status epilepticus: Secondary | ICD-10-CM | POA: Diagnosis not present

## 2023-07-02 DIAGNOSIS — G43009 Migraine without aura, not intractable, without status migrainosus: Secondary | ICD-10-CM

## 2023-07-02 DIAGNOSIS — R55 Syncope and collapse: Secondary | ICD-10-CM | POA: Diagnosis not present

## 2023-07-02 MED ORDER — GADOBUTROL 1 MMOL/ML IV SOLN
4.0000 mL | Freq: Once | INTRAVENOUS | Status: AC | PRN
  Administered 2023-07-02: 4 mL via INTRAVENOUS

## 2023-07-02 NOTE — Telephone Encounter (Signed)
 Routing to provider. Can work note be extended for the patient?  Copied from CRM [id number]. Topic: General - Other >> Jul 02, 2023  3:39 PM Yolanda T wrote: Reason for CRM: patient called stated she is still dizzy but doing PT 2x4w. She stated her work note ends on Monday but she is still super dizzy and will not see the ENT until 3/14. Patient is requesting and extension on her work note. Please f/u with patient

## 2023-07-05 ENCOUNTER — Encounter: Payer: Self-pay | Admitting: Family Medicine

## 2023-07-06 DIAGNOSIS — H81392 Other peripheral vertigo, left ear: Secondary | ICD-10-CM | POA: Diagnosis not present

## 2023-07-07 ENCOUNTER — Encounter: Payer: Self-pay | Admitting: Family Medicine

## 2023-07-08 DIAGNOSIS — H81392 Other peripheral vertigo, left ear: Secondary | ICD-10-CM | POA: Diagnosis not present

## 2023-07-09 ENCOUNTER — Ambulatory Visit (INDEPENDENT_AMBULATORY_CARE_PROVIDER_SITE_OTHER): Payer: PPO | Admitting: Family Medicine

## 2023-07-09 VITALS — BP 95/63 | HR 101 | Temp 98.5°F | Resp 18 | Ht <= 58 in | Wt 94.2 lb

## 2023-07-09 DIAGNOSIS — R42 Dizziness and giddiness: Secondary | ICD-10-CM | POA: Diagnosis not present

## 2023-07-09 NOTE — Assessment & Plan Note (Signed)
 No better. No improvement with nurtec. MRI normal. Mild improvement with PT, but not significant. Will get her back into see her neurologist and and await ENT input. Out of work for 1 month at this time. We will fill out FMLA- await paperwork. Call with any concerns.

## 2023-07-09 NOTE — Patient Instructions (Signed)
 Stacy Taylor at [facility] neurology July 21, 2023 9AM

## 2023-07-09 NOTE — Progress Notes (Signed)
 BP 95/63 (BP Location: Left Arm, Patient Position: Sitting, Cuff Size: Normal)   Pulse (!) 101   Temp 98.5 F (36.9 C) (Oral)   Resp 18   Ht 4\' 7"  (1.397 m)   Wt 94 lb 3.2 oz (42.7 kg)   SpO2 98%   BMI 21.89 kg/m    Subjective:    Patient ID: Stacy Taylor, female    DOB: [birth year], 33 y.o.   MRN: [id number]  HPI: Stacy Taylor is a 33 y.o. female  Chief Complaint  Patient presents with   Dizziness    No improvement, about the same and affecting her mental health. Twice weekly PT   PT seems like it is helping a little bit. She notes that she has a long way to go. She is not doing well. She notes that she can't go to the grocery store because of the lights. She can't read because moving her eyes is making her more dizzy. Nurtec didn't help at all. She is seeing ENT next Friday. Considering putting her 2 weeks in at her job. This is starting to really effect her mood. She is not feeling well. No other concerns or complaints at this time.   Relevant past medical, surgical, family and social history reviewed and updated as indicated. Interim medical history since our last visit reviewed. Allergies and medications reviewed and updated.  Review of Systems  Constitutional: Negative.   HENT: Negative.    Respiratory: Negative.    Cardiovascular: Negative.   Gastrointestinal: Negative.   Neurological:  Positive for dizziness and headaches. Negative for tremors, seizures, syncope, facial asymmetry, speech difficulty, weakness, light-headedness and numbness.  Psychiatric/Behavioral: Negative.      Per HPI unless specifically indicated above     Objective:    BP 95/63 (BP Location: Left Arm, Patient Position: Sitting, Cuff Size: Normal)   Pulse (!) 101   Temp 98.5 F (36.9 C) (Oral)   Resp 18   Ht 4\' 7"  (1.397 m)   Wt 94 lb 3.2 oz (42.7 kg)   SpO2 98%   BMI 21.89 kg/m   Wt Readings from Last 3 Encounters:  07/09/23 94 lb 3.2 oz (42.7 kg)  06/25/23 94 lb 3.2 oz  (42.7 kg)  06/18/23 93 lb (42.2 kg)    Physical Exam Vitals and nursing note reviewed.  Constitutional:      General: She is not in acute distress.    Appearance: Normal appearance. She is not ill-appearing, toxic-appearing or diaphoretic.  HENT:     Head: Normocephalic and atraumatic.     Right Ear: External ear normal.     Left Ear: External ear normal.     Nose: Nose normal.     Mouth/Throat:     Mouth: Mucous membranes are moist.     Pharynx: Oropharynx is clear.  Eyes:     General: No scleral icterus.       Right eye: No discharge.        Left eye: No discharge.     Extraocular Movements: Extraocular movements intact.     Conjunctiva/sclera: Conjunctivae normal.     Pupils: Pupils are equal, round, and reactive to light.  Cardiovascular:     Rate and Rhythm: Normal rate and regular rhythm.     Pulses: Normal pulses.     Heart sounds: Normal heart sounds. No murmur heard.    No friction rub. No gallop.  Pulmonary:     Effort: Pulmonary effort is normal. No respiratory distress.  Breath sounds: Normal breath sounds. No stridor. No wheezing, rhonchi or rales.  Chest:     Chest wall: No tenderness.  Musculoskeletal:        General: Normal range of motion.     Cervical back: Normal range of motion and neck supple.  Skin:    General: Skin is warm and dry.     Capillary Refill: Capillary refill takes less than 2 seconds.     Coloration: Skin is not jaundiced or pale.     Findings: No bruising, erythema, lesion or rash.  Neurological:     General: No focal deficit present.     Mental Status: She is alert and oriented to person, place, and time. Mental status is at baseline.  Psychiatric:        Mood and Affect: Mood normal.        Behavior: Behavior normal.        Thought Content: Thought content normal.        Judgment: Judgment normal.     Results for orders placed or performed in visit on 06/18/23  TSH   Collection Time: 06/18/23 11:22 AM  Result Value Ref  Range   TSH 0.817 0.450 - 4.500 uIU/mL  Comprehensive metabolic panel   Collection Time: 06/18/23 11:22 AM  Result Value Ref Range   Glucose 83 70 - 99 mg/dL   BUN 16 6 - 20 mg/dL   Creatinine, Ser 1.91 0.57 - 1.00 mg/dL   eGFR 78 >47 WG/NFA/2.13   BUN/Creatinine Ratio 16 9 - 23   Sodium 142 134 - 144 mmol/L   Potassium 3.7 3.5 - 5.2 mmol/L   Chloride 108 (H) 96 - 106 mmol/L   CO2 18 (L) 20 - 29 mmol/L   Calcium 8.9 8.7 - 10.2 mg/dL   Total Protein 6.9 6.0 - 8.5 g/dL   Albumin 4.6 3.9 - 4.9 g/dL   Globulin, Total 2.3 1.5 - 4.5 g/dL   Bilirubin Total 0.5 0.0 - 1.2 mg/dL   Alkaline Phosphatase 50 44 - 121 IU/L   AST 14 0 - 40 IU/L   ALT 9 0 - 32 IU/L  CBC with Differential/Platelet   Collection Time: 06/18/23 11:22 AM  Result Value Ref Range   WBC 6.5 3.4 - 10.8 x10E3/uL   RBC 4.11 3.77 - 5.28 x10E6/uL   Hemoglobin 13.6 11.1 - 15.9 g/dL   Hematocrit 08.6 57.8 - 46.6 %   MCV 98 (H) 79 - 97 fL   MCH 33.1 (H) 26.6 - 33.0 pg   MCHC 33.9 31.5 - 35.7 g/dL   RDW 46.9 62.9 - 52.8 %   Platelets 157 150 - 450 x10E3/uL   Neutrophils 57 Not Estab. %   Lymphs 36 Not Estab. %   Monocytes 5 Not Estab. %   Eos 1 Not Estab. %   Basos 1 Not Estab. %   Neutrophils Absolute 3.7 1.4 - 7.0 x10E3/uL   Lymphocytes Absolute 2.4 0.7 - 3.1 x10E3/uL   Monocytes Absolute 0.3 0.1 - 0.9 x10E3/uL   EOS (ABSOLUTE) 0.1 0.0 - 0.4 x10E3/uL   Basophils Absolute 0.1 0.0 - 0.2 x10E3/uL   Immature Granulocytes 0 Not Estab. %   Immature Grans (Abs) 0.0 0.0 - 0.1 x10E3/uL  Ferritin   Collection Time: 06/18/23 11:22 AM  Result Value Ref Range   Ferritin 148 15 - 150 ng/mL  Iron   Collection Time: 06/18/23 11:22 AM  Result Value Ref Range   Iron 77 27 - 159 ug/dL  Assessment & Plan:   Problem List Items Addressed This Visit       Other   Dizziness - Primary   No better. No improvement with nurtec. MRI normal. Mild improvement with PT, but not significant. Will get her back into see her  neurologist and and await ENT input. Out of work for 1 month at this time. We will fill out FMLA- await paperwork. Call with any concerns.         Follow up plan: Return in about 3 weeks (around 07/30/2023).

## 2023-07-12 DIAGNOSIS — H81392 Other peripheral vertigo, left ear: Secondary | ICD-10-CM | POA: Diagnosis not present

## 2023-07-14 DIAGNOSIS — H81392 Other peripheral vertigo, left ear: Secondary | ICD-10-CM | POA: Diagnosis not present

## 2023-07-16 DIAGNOSIS — H903 Sensorineural hearing loss, bilateral: Secondary | ICD-10-CM | POA: Diagnosis not present

## 2023-07-16 DIAGNOSIS — R42 Dizziness and giddiness: Secondary | ICD-10-CM | POA: Diagnosis not present

## 2023-07-16 DIAGNOSIS — H6123 Impacted cerumen, bilateral: Secondary | ICD-10-CM | POA: Diagnosis not present

## 2023-07-20 DIAGNOSIS — H81392 Other peripheral vertigo, left ear: Secondary | ICD-10-CM | POA: Diagnosis not present

## 2023-07-21 DIAGNOSIS — F0781 Postconcussional syndrome: Secondary | ICD-10-CM | POA: Diagnosis not present

## 2023-07-21 DIAGNOSIS — R42 Dizziness and giddiness: Secondary | ICD-10-CM | POA: Diagnosis not present

## 2023-07-21 DIAGNOSIS — F411 Generalized anxiety disorder: Secondary | ICD-10-CM | POA: Diagnosis not present

## 2023-07-21 DIAGNOSIS — H53149 Visual discomfort, unspecified: Secondary | ICD-10-CM | POA: Diagnosis not present

## 2023-07-21 DIAGNOSIS — G43109 Migraine with aura, not intractable, without status migrainosus: Secondary | ICD-10-CM | POA: Diagnosis not present

## 2023-07-21 DIAGNOSIS — R569 Unspecified convulsions: Secondary | ICD-10-CM | POA: Diagnosis not present

## 2023-07-22 DIAGNOSIS — R42 Dizziness and giddiness: Secondary | ICD-10-CM | POA: Diagnosis not present

## 2023-07-23 DIAGNOSIS — H81392 Other peripheral vertigo, left ear: Secondary | ICD-10-CM | POA: Diagnosis not present

## 2023-07-26 ENCOUNTER — Other Ambulatory Visit: Payer: Self-pay | Admitting: Student

## 2023-07-26 DIAGNOSIS — R42 Dizziness and giddiness: Secondary | ICD-10-CM

## 2023-07-27 DIAGNOSIS — H81392 Other peripheral vertigo, left ear: Secondary | ICD-10-CM | POA: Diagnosis not present

## 2023-07-28 ENCOUNTER — Ambulatory Visit
Admission: RE | Admit: 2023-07-28 | Discharge: 2023-07-28 | Disposition: A | Discharge status: Home / Self Care | Source: Ambulatory Visit | Attending: Student | Admitting: Student

## 2023-07-28 DIAGNOSIS — R42 Dizziness and giddiness: Secondary | ICD-10-CM | POA: Diagnosis not present

## 2023-07-30 DIAGNOSIS — H81392 Other peripheral vertigo, left ear: Secondary | ICD-10-CM | POA: Diagnosis not present

## 2023-08-02 DIAGNOSIS — H81392 Other peripheral vertigo, left ear: Secondary | ICD-10-CM | POA: Diagnosis not present

## 2023-08-05 DIAGNOSIS — H81392 Other peripheral vertigo, left ear: Secondary | ICD-10-CM | POA: Diagnosis not present

## 2023-08-11 DIAGNOSIS — H81392 Other peripheral vertigo, left ear: Secondary | ICD-10-CM | POA: Diagnosis not present

## 2023-08-12 NOTE — Progress Notes (Signed)
 Stacy Taylor is a 33 y.o. female who is seen in consultation at the request of None Per Patient Pcp for evaluation of dizziness. The patient reports she experienced a sudden onset of dizziness at work about 8 weeks ago, which has since been persistent. She denies any precipitating events. She describes the dizziness as a floating sensation, as if she is looking down on things from above. She endorses associated non-pulsatile tinnitus in both ears. She was started on high-dose steroids by her local ENT soon after symptoms began, which reportedly made her feel worse. She has been using a cane for ambulation and is no longer working. She does have a history of migraines and develops headaches daily, though neurology believes this to be unrelated to the dizziness.   Past medical history, family history and ROS reviewed on attached notes.  BP 103/73   Pulse 102   Temp 36.9 C (98.4 F)   Ht 139.7 cm (4' 7)   Wt 43.6 kg (96 lb 3.2 oz)   BMI 22.36 kg/m   General: well appearing, stated age, no distress  Communicates age appropriate, responds to speech well Head - atraumatic, normocephalic  Face - no surface abnormalities, no tenderness over sinuses  Facial Strength - AD 1/6, AS 1/6 Eyes - no conjunctivitis, no scleritis/iritis, EOM full  Ears - External ear- normal, no lesions, no malformations  Otoscopy -normal bilaterally, pneumatic otoscopy without response bilaterally Tuning fork test - Weber midline Nose - external nose without deformity, anterior rhinoscopy - turbinates, mucosa normal  Oral Cavity - lips without lesions, dentition nl, alveolus normal, hard palate normal, mucosa floor of mouth, buccal mucosa, retromolar trigone mucosa all normal  Oropharynx - no mucosal lesions, tonsil, soft palate, posterior pharynx  Salivary Glands - no mass or assymmetry, no tenderness  Neck - no lymphadenopathy, no thyromegaly  Lymphatic - no neck lymphadenopathy  Cardiovascular - heart regular rate  and rhythym, no murmurs appreciated Pulmonary - lungs clear to auscultation bilaterally Neurologic - cranial nerves 2-12 intact, patient oriented x3, appropriate mood  Imaging: CT temporal bones (07/28/23) was reviewed. This shows a dominant and high-riding jugular bulb on the left, without dehiscence, and is otherwise unremarkable. MRI Brain w contrast (07/28/23) was also reviewed and showed no evidence of retrocochlear pathology. Normal inner ear morphology.   All outside records were personally reviewed and summarized above.   Assessment/Plan: Stacy Taylor is a 33 y.o. 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   Plan is to proceed with starting a selective serotonin reuptake inhibitor, as this is the preferred medication for treatment of this condition.SABRA  She will communicate with her psychiatrist to determine the best medication to start on.

## 2023-08-13 DIAGNOSIS — R42 Dizziness and giddiness: Secondary | ICD-10-CM | POA: Diagnosis not present

## 2023-08-16 ENCOUNTER — Encounter: Payer: Self-pay | Admitting: Family Medicine

## 2023-08-16 DIAGNOSIS — R42 Dizziness and giddiness: Secondary | ICD-10-CM

## 2023-08-19 ENCOUNTER — Telehealth: Payer: Self-pay | Admitting: Psychiatry

## 2023-08-19 ENCOUNTER — Encounter: Payer: Self-pay | Admitting: Psychiatry

## 2023-08-19 DIAGNOSIS — F250 Schizoaffective disorder, bipolar type: Secondary | ICD-10-CM | POA: Diagnosis not present

## 2023-08-19 DIAGNOSIS — R42 Dizziness and giddiness: Secondary | ICD-10-CM

## 2023-08-19 NOTE — Progress Notes (Signed)
 Virtual Visit via Video Note  I connected with Stacy Taylor on 08/19/23 at  4:40 PM EDT by a video enabled telemedicine application and verified that I am speaking with the correct person using two identifiers.  Location Provider Location : [facility] Patient Location : Home  Participants: Patient , Provider    I discussed the limitations of evaluation and management by telemedicine and the availability of in person appointments. The patient expressed understanding and agreed to proceed.   I discussed the assessment and treatment plan with the patient. The patient was provided an opportunity to ask questions and all were answered. The patient agreed with the plan and demonstrated an understanding of the instructions.   The patient was advised to call back or seek an in-person evaluation if the symptoms worsen or if the condition fails to improve as anticipated.   BH MD OP Progress Note  08/20/2023 7:24 AM Stacy Taylor  MRN:  [id number]  Chief Complaint:  Chief Complaint  Patient presents with   Follow-up   Depression   Anxiety   Medication Refill   Discussed the use of AI scribe software for clinical note transcription with the patient, who gave verbal consent to proceed.  History of Present Illness Stacy Taylor is a 33 year old Caucasian female, lives in [place], married, has a history of schizoaffective disorder, migraine headaches, seizure-like spells who presents with persistent dizziness and tinnitus.  She has been experiencing persistent dizziness and tinnitus since June 25, 2023. The dizziness began suddenly at work without any preceding injury or head trauma, leading to her inability to drive and forcing her to quit her job. She now uses a cane for mobility due to difficulty standing or walking unaided.  She has consulted two ENT specialists and undergone multiple diagnostic tests, including an MRI of the brain.  She has been in physical therapy for eight  weeks without improvement.  The first ENT provider suggested hearing aids for the tinnitus, while the second ENT provider diagnosed her with persistent postural perceptual dizziness and recommended a selective serotonin reuptake inhibitor (SSRI). She has completed two rounds of steroids prescribed by a local ENT, which worsened her dizziness without affecting the tinnitus. The tinnitus is described as 'obnoxious' and 'ear piercing,' causing sleep disturbances and hearing loss.  Her current medications include Seroquel , BuSpar , nortriptyline prescribed by neurology for headaches, which she has been taking for several months prior to the onset of dizziness, and a monthly injection for migraines started after the dizziness began. She also takes Zofran  as needed for nausea associated with severe dizziness and receives an annual Reclast  injection for osteoporosis.  She has a strong support system, including family members who assist with transportation and social activities. Financially, she is stable due to a settlement from a past injury, which she has saved for emergencies.  No major mood swings, depression, or anxiety. She reports persistent auditory hallucinations, which she describes as always present but less bothersome than the tinnitus.  Denies thoughts of self-harm or paranoia.  She is currently interested in continuing her current psychotropic medications including Seroquel , BuSpar  although she is aware that these medications could also cause dizziness .  She is not interested in referral for psychotherapy sessions at this time.  Visit Diagnosis:    ICD-10-CM   1. Schizoaffective disorder, bipolar type (HCC)  F25.0     2. Dizziness  R42       Past Psychiatric History: I have reviewed past psychiatric history from progress note on  08/08/2021.  Past trials of medications like Zyprexa, gabapentin , multiple others.  Past Medical History:  Past Medical History:  Diagnosis Date   Anxiety     Asthma    Depression    Fetal alcohol syndrome    Migraines    Osteoporosis    Scoliosis    Seizures (HCC)     Past Surgical History:  Procedure Laterality Date   FRACTURE SURGERY     HAND SURGERY     TONSILECTOMY, ADENOIDECTOMY, BILATERAL MYRINGOTOMY AND TUBES     WRIST SURGERY      Family Psychiatric History: I have reviewed family psychiatric history from progress note on 08/08/2021.  Family History:  Family History  Problem Relation Age of Onset   Drug abuse Other    Mental illness Other     Social History: I have reviewed social history from progress note on 08/08/2021. Social History   Socioeconomic History   Marital status: Married    Spouse name: Not on file   Number of children: Not on file   Years of education: Not on file   Highest education level: GED or equivalent  Occupational History   Not on file  Tobacco Use   Smoking status: Former    Types: Cigarettes   Smokeless tobacco: Never   Tobacco comments:    1 cig per day reported 03/14/2020  Vaping Use   Vaping status: Some Days  Substance and Sexual Activity   Alcohol use: Yes    Comment: socially   Drug use: Never   Sexual activity: Yes    Birth control/protection: I.U.D.  Other Topics Concern   Not on file  Social History Narrative   ** Merged History Encounter **       Social Drivers of Taylor   Financial Resource Strain: Low Risk  (07/09/2023)   Overall Financial Resource Strain (CARDIA)    Difficulty of Paying Living Expenses: Not hard at all  Food Insecurity: No Food Insecurity (07/09/2023)   Hunger Vital Sign    Worried About Running Out of Food in the Last Year: Never true    Ran Out of Food in the Last Year: Never true  Transportation Needs: No Transportation Needs (07/09/2023)   PRAPARE - Administrator, Civil Service (Medical): No    Lack of Transportation (Non-Medical): No  Physical Activity: Insufficiently Active (07/09/2023)   Exercise Vital Sign    Days of  Exercise per Week: 1 day    Minutes of Exercise per Session: 10 min  Stress: Stress Concern Present (07/09/2023)   Stacy Taylor - Occupational Stress Questionnaire    Feeling of Stress : To some extent  Social Connections: Moderately Isolated (07/09/2023)   Social Connection and Isolation Panel [NHANES]    Frequency of Communication with Friends and Family: More than three times a week    Frequency of Social Gatherings with Friends and Family: Once a week    Attends Religious Services: Never    Database administrator or Organizations: No    Attends Engineer, structural: Not on file    Marital Status: Married    Allergies:  Allergies  Allergen Reactions   Ibuprofen     Abdominal pain, upset stomach    Metabolic Disorder Labs: Lab Results  Component Value Date   HGBA1C 3.9 (L) 02/02/2023   MPG 65.23 02/02/2023   Lab Results  Component Value Date   PROLACTIN 8.8 02/02/2023   PROLACTIN 5.8 02/14/2021  Lab Results  Component Value Date   CHOL 150 03/27/2022   TRIG 36 03/27/2022   HDL 64 03/27/2022   LDLCALC 77 03/27/2022   LDLCALC 77 02/14/2021   Lab Results  Component Value Date   TSH 0.817 06/18/2023   TSH 0.830 03/27/2022    Therapeutic Level Labs: No results found for: "LITHIUM" No results found for: "VALPROATE" No results found for: "CBMZ"  Current Medications: Current Outpatient Medications  Medication Sig Dispense Refill   albuterol  (VENTOLIN  HFA) 108 (90 Base) MCG/ACT inhaler Inhale 2 puffs into the lungs every 4 (four) hours as needed for wheezing or shortness of breath. 8 g 12   budesonide -formoterol  (SYMBICORT ) 160-4.5 MCG/ACT inhaler Inhale 2 puffs into the lungs 2 (two) times daily. 10.2 g 12   busPIRone  (BUSPAR ) 15 MG tablet Take 1 tablet (15 mg total) by mouth 2 (two) times daily. 180 tablet 1   EMGALITY 120 MG/ML SOAJ ADMINISTER 120 MG UNDER THE SKIN MONTHLY     gabapentin  (NEURONTIN ) 400 MG capsule Take 1  capsule (400 mg total) by mouth 3 (three) times daily. 270 capsule 0   levonorgestrel (LILETTA, 52 MG,) 19.5 MCG/DAY IUD IUD 1 each by Intrauterine route once.     meclizine  (ANTIVERT ) 12.5 MG tablet Take 1 tablet (12.5 mg total) by mouth 3 (three) times daily as needed for dizziness. 90 tablet 0   nortriptyline (PAMELOR) 10 MG capsule Take 10 mg by mouth every morning.     nortriptyline (PAMELOR) 50 MG capsule Take 50 mg by mouth at bedtime.     QUEtiapine  (SEROQUEL ) 25 MG tablet Take 2 tablets (50 mg total) by mouth at bedtime. 180 tablet 1   topiramate (TOPAMAX) 100 MG tablet Take 1 tablet by mouth at bedtime.     Vitamin D , Ergocalciferol , (DRISDOL ) 1.25 MG (50000 UNIT) CAPS capsule Take 1 capsule (50,000 Units total) by mouth once a week. 12 capsule 1   zoledronic  acid (RECLAST ) 5 MG/100ML SOLN injection Inject 5 mg into the vein once. Once yearly     zolmitriptan (ZOMIG) 5 MG tablet Take 5 mg by mouth as needed for migraine.     ondansetron  (ZOFRAN ) 4 MG tablet Take 4 mg by mouth.     No current facility-administered medications for this visit.     Musculoskeletal: Strength & Muscle Tone:  UTA Gait & Station:  Seated Patient leans: N/A  Psychiatric Specialty Exam: Review of Systems  HENT:  Positive for tinnitus.   Neurological:  Positive for dizziness.  Psychiatric/Behavioral:  The patient is nervous/anxious.     There were no vitals taken for this visit.There is no height or weight on file to calculate BMI.  General Appearance: Casual  Eye Contact:  Fair  Speech:  Clear and Coherent  Volume:  Normal  Mood:  Anxious  Affect:  Congruent  Thought Process:  Goal Directed and Descriptions of Associations: Intact  Orientation:  Full (Time, Place, and Person)  Thought Content: Logical   Suicidal Thoughts:  No  Homicidal Thoughts:  No  Memory:  Immediate;   Fair Recent;   Fair Remote;   Fair  Judgement:  Fair  Insight:  Fair  Psychomotor Activity:  Normal  Concentration:   Concentration: Fair and Attention Span: Fair  Recall:  [employer] of Knowledge: Fair  Language: Fair  Akathisia:  No  Handed:  Right  AIMS (if indicated): not done  Assets:  Desire for Improvement Housing Social Support Transportation  ADL's:  Intact  Cognition:  WNL  Sleep:  Fair   Screenings: AIMS    Flowsheet Row Office Visit from 02/02/2023 in [facility] Office Visit from 05/13/2022 in [facility] Office Visit from 10/29/2021 in [facility] Video Visit from 08/08/2021 in [facility]  AIMS Total Score 0 0 0 0      AUDIT    Flowsheet Row Clinical Support from 01/05/2022 in [facility]  Alcohol Use Disorder Identification Test Final Score (AUDIT) 4      GAD-7    Flowsheet Row Office Visit from 07/09/2023 in [facility] Office Visit from 06/18/2023 in [facility] Office Visit from 02/02/2023 in [facility] Office Visit from 05/13/2022 in [facility] Office Visit from 03/27/2022 in [facility]  Total GAD-7 Score 2 0 1 0 0      PHQ2-9    Flowsheet Row Office Visit from 07/09/2023 in [facility] Office Visit from 06/18/2023 in [facility] Office Visit from 02/02/2023 in [facility] Video Visit from 10/07/2022 in [facility] Office Visit from 05/13/2022 in [facility]  PHQ-2 Total Score 2 0 0 0 0  PHQ-9 Total Score 6 4 -- -- 0      Flowsheet Row Video Visit from 08/19/2023 in [facility] Video Visit from 05/03/2023 in [facility] Office Visit from 02/02/2023 in [facility]  C-SSRS RISK CATEGORY Moderate Risk Moderate Risk Low Risk        Assessment and Plan:Mozell Sporrer is a 33 year old Caucasian female, married, on SSI, lives in [place], has a history of schizoaffective disorder, recent dizziness with tinnitus affecting her daily mobility and functioning, discussed assessment and plan as noted below.  Assessment & Plan Dizziness likely persistent postural-perceptual dizziness per review of recent ENT records-unstable Persistent dizziness since June 25, 2023, with associated tinnitus and hearing loss. Multiple evaluations, including MRI and CT scans, have not identified a clear etiology. ENT specialists suggest persistent postural-perceptual dizziness and recommend SSRIs, but there is reluctance to start due to potential side effects and impact on mood stability. Concerns about SSRIs exacerbating tinnitus. Cognitive behavioral therapy is also recommended. - Refered to specialty in [place] upcoming appointment. - Consider cognitive behavioral therapy while awaiting evaluation. - Discuss potential SSRI treatment after evaluation if still interested.  Schizoaffective disorder-stable Currently well-managed on current medication regimen. - Continue Seroquel  50 mg at bedtime. - Continue BuSpar  15 mg twice daily. - Discussed the effect of Seroquel  as well as BuSpar  for dizziness and tinnitus.  Patient declines any medication changes at this time and would like to continue the current medication regimen.   Follow-up Follow-up in clinic in 2 months or sooner in person.     Collaboration of Care: Collaboration of Care: Patient refused AEB patient declined referral for CBT. I have reviewed notes per [facility] ENT-Dr. Brown-patient with perceptual postural dizziness recommended initiation of SSRI.   Patient/Guardian was advised Release  of Information must be obtained prior to any record release in order to collaborate their care with an outside provider. Patient/Guardian was advised if they have not already done so to contact the registration department to sign all necessary forms in  order for us  to release information regarding their care.   Consent: Patient/Guardian gives verbal consent for treatment and assignment of benefits for services provided during this visit. Patient/Guardian expressed understanding and agreed to proceed.   This note was generated in part or whole with voice recognition software. Voice recognition is usually quite accurate but there are transcription errors that can and very often do occur. I apologize for any typographical errors that were not detected and corrected.    Maddux Vanscyoc, MD 08/20/2023, 7:24 AM

## 2023-08-20 ENCOUNTER — Telehealth: Payer: Self-pay

## 2023-08-20 ENCOUNTER — Encounter: Payer: Self-pay | Admitting: Family Medicine

## 2023-08-20 ENCOUNTER — Ambulatory Visit (INDEPENDENT_AMBULATORY_CARE_PROVIDER_SITE_OTHER): Admitting: Family Medicine

## 2023-08-20 VITALS — BP 100/68 | HR 108 | Ht <= 58 in | Wt 94.2 lb

## 2023-08-20 DIAGNOSIS — R42 Dizziness and giddiness: Secondary | ICD-10-CM | POA: Diagnosis not present

## 2023-08-20 DIAGNOSIS — H903 Sensorineural hearing loss, bilateral: Secondary | ICD-10-CM | POA: Insufficient documentation

## 2023-08-20 MED ORDER — TRAZODONE HCL 50 MG PO TABS: 25.0000 mg | ORAL_TABLET | Freq: Every evening | ORAL | 3 refills | Status: DC | PRN

## 2023-08-20 NOTE — Progress Notes (Signed)
 BP 100/68 (BP Location: Left Arm, Patient Position: Sitting, Cuff Size: Small)   Pulse (!) 108   Ht 4\' 7"  (1.397 m)   Wt 94 lb 3.2 oz (42.7 kg)   BMI 21.89 kg/m    Subjective:    Patient ID: Stacy Taylor, female    DOB: 1991-02-25, 33 y.o.   MRN: [id number]  HPI: Stacy Taylor is a 33 y.o. female  Chief Complaint  Patient presents with   Dizziness    Pt states that he dizziness has gotten worse so now she has to use a cane to get around.    Dizziness has been getting worse. Now using a cane to help with her balance. She notes that she has left her job. She did PT for 7 weeks. It was helping at first, but hasn't gotten better. She is continuing to do her exercises at home. She has seen 2 ENTs and her neurologist and her psychiatrist. The first ENT put her on steroid burst which made her dizziness worse and discussed hearing aids for the tinnitus. They did [employer] and VNG. 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 She discussed this with her psychiatrist, but because her mood is so stable, she didn't want to make any changes there. Her psychiatrist thought it may be due to her meds, but she's been on them an extended period and has been so stable that she didn't want to change them if she doesn't have to. Her neurologist did not think her dizziness was related to her migraines and thought it may be related to her inner ear. She follows up with them in 2 weeks. She note that it gets triggered really easily by lights in grocery stores, moving her eyes and sweeping the floor. She feels like she's getting worse rather than better. She is very frustrated. She has a referral to a 3rd ENT who specializes in balance issues. She does not have that appointment yet. She is otherwise OK. No other concerns or complaints at this time.    Relevant past medical, surgical, family and social history reviewed and updated as indicated. Interim medical history since our  last visit reviewed. Allergies and medications reviewed and updated.  Review of Systems  Constitutional: Negative.   HENT:  Positive for tinnitus. Negative for congestion, dental problem, drooling, ear discharge, ear pain, facial swelling, hearing loss, mouth sores, nosebleeds, postnasal drip, rhinorrhea, sinus pressure, sinus pain, sneezing, sore throat, trouble swallowing and voice change.   Respiratory: Negative.    Cardiovascular: Negative.   Neurological:  Positive for dizziness, light-headedness and headaches. Negative for tremors, seizures, syncope, facial asymmetry, speech difficulty, weakness and numbness.  Psychiatric/Behavioral: Negative.      Per HPI unless specifically indicated above     Objective:    BP 100/68 (BP Location: Left Arm, Patient Position: Sitting, Cuff Size: Small)   Pulse (!) 108   Ht 4\' 7"  (1.397 m)   Wt 94 lb 3.2 oz (42.7 kg)   BMI 21.89 kg/m   Wt Readings from Last 3 Encounters:  08/20/23 94 lb 3.2 oz (42.7 kg)  07/09/23 94 lb 3.2 oz (42.7 kg)  06/25/23 94 lb 3.2 oz (42.7 kg)    Physical Exam Vitals and nursing note reviewed.  Constitutional:      General: She is not in acute distress.    Appearance: Normal appearance. She is well-developed.  HENT:     Head: Normocephalic and atraumatic.  Right Ear: Hearing and external ear normal.     Left Ear: Hearing and external ear normal.     Nose: Nose normal.     Mouth/Throat:     Mouth: Mucous membranes are moist.     Pharynx: Oropharynx is clear.  Eyes:     General: Lids are normal. No scleral icterus.       Right eye: No discharge.        Left eye: No discharge.     Conjunctiva/sclera: Conjunctivae normal.  Pulmonary:     Effort: Pulmonary effort is normal. No respiratory distress.  Musculoskeletal:        General: Normal range of motion.  Skin:    Coloration: Skin is not jaundiced or pale.     Findings: No bruising, erythema, lesion or rash.  Neurological:     Mental Status: She is  alert. Mental status is at baseline. She is disoriented.  Psychiatric:        Mood and Affect: Mood normal.        Speech: Speech normal.        Behavior: Behavior normal.        Thought Content: Thought content normal.        Judgment: Judgment normal.     Results for orders placed or performed in visit on 06/18/23  TSH   Collection Time: 06/18/23 11:22 AM  Result Value Ref Range   TSH 0.817 0.450 - 4.500 uIU/mL  Comprehensive metabolic panel   Collection Time: 06/18/23 11:22 AM  Result Value Ref Range   Glucose 83 70 - 99 mg/dL   BUN 16 6 - 20 mg/dL   Creatinine, Ser 9.52 0.57 - 1.00 mg/dL   eGFR 78 >84 XL/KGM/0.10   BUN/Creatinine Ratio 16 9 - 23   Sodium 142 134 - 144 mmol/L   Potassium 3.7 3.5 - 5.2 mmol/L   Chloride 108 (H) 96 - 106 mmol/L   CO2 18 (L) 20 - 29 mmol/L   Calcium 8.9 8.7 - 10.2 mg/dL   Total Protein 6.9 6.0 - 8.5 g/dL   Albumin 4.6 3.9 - 4.9 g/dL   Globulin, Total 2.3 1.5 - 4.5 g/dL   Bilirubin Total 0.5 0.0 - 1.2 mg/dL   Alkaline Phosphatase 50 44 - 121 IU/L   AST 14 0 - 40 IU/L   ALT 9 0 - 32 IU/L  CBC with Differential/Platelet   Collection Time: 06/18/23 11:22 AM  Result Value Ref Range   WBC 6.5 3.4 - 10.8 x10E3/uL   RBC 4.11 3.77 - 5.28 x10E6/uL   Hemoglobin 13.6 11.1 - 15.9 g/dL   Hematocrit 27.2 53.6 - 46.6 %   MCV 98 (H) 79 - 97 fL   MCH 33.1 (H) 26.6 - 33.0 pg   MCHC 33.9 31.5 - 35.7 g/dL   RDW 64.4 03.4 - 74.2 %   Platelets 157 150 - 450 x10E3/uL   Neutrophils 57 Not Estab. %   Lymphs 36 Not Estab. %   Monocytes 5 Not Estab. %   Eos 1 Not Estab. %   Basos 1 Not Estab. %   Neutrophils Absolute 3.7 1.4 - 7.0 x10E3/uL   Lymphocytes Absolute 2.4 0.7 - 3.1 x10E3/uL   Monocytes Absolute 0.3 0.1 - 0.9 x10E3/uL   EOS (ABSOLUTE) 0.1 0.0 - 0.4 x10E3/uL   Basophils Absolute 0.1 0.0 - 0.2 x10E3/uL   Immature Granulocytes 0 Not Estab. %   Immature Grans (Abs) 0.0 0.0 - 0.1 x10E3/uL  Ferritin  Collection Time: 06/18/23 11:22 AM  Result  Value Ref Range   Ferritin 148 15 - 150 ng/mL  Iron   Collection Time: 06/18/23 11:22 AM  Result Value Ref Range   Iron 77 27 - 159 ug/dL      Assessment & Plan:   Problem List Items Addressed This Visit       Nervous and Auditory   Bilateral sensorineural hearing loss   Referral to audiology for ?hearing aids placed today.       Relevant Orders   Ambulatory referral to Audiology     Other   Dizziness - Primary   Continues to have significant problems. 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 She notes that her tinnitus makes the dizziness significantly worse- referral to audiologist for ?hearing aides placed today. She has a follow up with neurology scheduled for about 2 weeks from now. They didn't think it was migraine related, however her VNG with ENT had significant eye movement issues. Will refer to neuro-opthlamology. She is aware that it will be a longer wait to see them. Continue vestibular exercises at home. Call with any concerns. Referral to balance disorder ENT placed yesterday. Recheck in about 3 months.       Relevant Orders   Ambulatory referral to Neurology     Follow up plan: Return in about 3 months (around 11/19/2023).   >25 minutes spent with patient today

## 2023-08-20 NOTE — Assessment & Plan Note (Signed)
 Referral to audiology for ?hearing aids placed today.

## 2023-08-20 NOTE — Assessment & Plan Note (Signed)
 Continues to have significant problems. 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 She notes that her tinnitus makes the dizziness significantly worse- referral to audiologist for ?hearing aides placed today. She has a follow up with neurology scheduled for about 2 weeks from now. They didn't think it was migraine related, however her VNG with ENT had significant eye movement issues. Will refer to neuro-opthlamology. She is aware that it will be a longer wait to see them. Continue vestibular exercises at home. Call with any concerns. Referral to balance disorder ENT placed yesterday. Recheck in about 3 months.

## 2023-08-25 ENCOUNTER — Telehealth: Payer: Self-pay | Admitting: Family Medicine

## 2023-08-25 NOTE — Telephone Encounter (Signed)
 Copied from CRM [id number]. Topic: General - Other >> Aug 24, 2023  3:47 PM Emylou G wrote: Reason for CRM: Patient inquiring on referrals for hearing aids.. Number on file good.. wasn't sure if she got the address that she sent to doctor

## 2023-09-01 ENCOUNTER — Telehealth: Payer: Self-pay | Admitting: Family Medicine

## 2023-09-01 DIAGNOSIS — H903 Sensorineural hearing loss, bilateral: Secondary | ICD-10-CM | POA: Diagnosis not present

## 2023-09-01 NOTE — Telephone Encounter (Signed)
 Copied from CRM [id number]. Topic: Referral - Question >> Aug 31, 2023 12:03 PM Star East wrote: Reason for CRM: [facility] does not take faxed referrals, Dr Gandolfi needs it called in to give a verbal referral due to being outside referral.  Patient had called over for a status of referral.

## 2023-09-03 NOTE — Telephone Encounter (Signed)
 Forwarding to referral's team for assistance.

## 2023-09-13 ENCOUNTER — Telehealth: Payer: Self-pay | Admitting: Family Medicine

## 2023-09-13 NOTE — Telephone Encounter (Signed)
 Copied from CRM [id number]. Topic: Medicare AWV >> Sep 13, 2023 10:49 AM Juliana Ocean wrote: Reason for CRM: LVM 09/13/2023 to schedule AWV. Please schedule office or virtual visits  Rosalee Collins; Care Guide Ambulatory Clinical Support [facility] Direct Dial: [phone]

## 2023-09-14 NOTE — Telephone Encounter (Signed)
 error

## 2023-10-07 ENCOUNTER — Other Ambulatory Visit: Payer: Self-pay | Admitting: Psychiatry

## 2023-10-07 DIAGNOSIS — F250 Schizoaffective disorder, bipolar type: Secondary | ICD-10-CM

## 2023-10-19 ENCOUNTER — Other Ambulatory Visit: Payer: Self-pay

## 2023-10-19 ENCOUNTER — Ambulatory Visit: Admitting: Psychiatry

## 2023-10-19 ENCOUNTER — Encounter: Payer: Self-pay | Admitting: Psychiatry

## 2023-10-19 VITALS — BP 99/68 | HR 102 | Temp 97.5°F | Ht <= 58 in | Wt 95.8 lb

## 2023-10-19 DIAGNOSIS — F250 Schizoaffective disorder, bipolar type: Secondary | ICD-10-CM | POA: Diagnosis not present

## 2023-10-19 DIAGNOSIS — G4700 Insomnia, unspecified: Secondary | ICD-10-CM | POA: Diagnosis not present

## 2023-10-19 MED ORDER — TRAZODONE HCL 50 MG PO TABS: 75.0000 mg | ORAL_TABLET | Freq: Every evening | ORAL | 1 refills | Status: DC | PRN

## 2023-10-19 NOTE — Progress Notes (Signed)
 [facility] MD OP Progress Note  10/19/2023 1:30 PM Stacy Taylor  MRN:  [id number]  Chief Complaint:  Chief Complaint  Patient presents with   Follow-up   Depression   Hallucinations   Medication Refill   HPI: Stacy Taylor is a 33 year old Caucasian female, lives in [place], married, has a history of schizoaffective disorder, migraine headaches, seizure-like spells, dizziness, tinnitus, was evaluated in office today for a follow-up appointment  She is currently getting better with regards to her dizziness and tinnitus.  She is currently using hearing aids and that seems to have resolved the problem.  She reports the past couple of days it has been more warm outside and the heat seems to exacerbate her dizziness.  She has has been using a cane for assistance.  She however reports prior to that she has not needed the cane for a very long time and her dizziness had significantly improved.  She continues to have hallucinations of hearing whispers although it is chronic and it does not bother her.  She has been coping with her anxiety regarding her health issues better than before.  She denies any significant depression symptoms.  She does struggle with sleep.  Previously she used to sleep 6-1/2 hours and most recently she has been getting only 5 hours.  Although it does not affect her much she is interested in a dosage increase of trazodone  if possible.  She does take nortriptyline for headaches however she reports it does not help her with her sleep.  She is aware of drug-drug interaction between nortriptyline and trazodone  including serotonin syndrome.  Currently denies any side effects.  She is currently compliant on her other medications which includes Seroquel , BuSpar  which is prescribed as 15 mg twice a day although she has been taking only once a day dosage of 15 mg.  She denies any suicidality, homicidality or perceptual disturbances.  She looks forward to taking a trip to [place]  to  be with her family for a month or so.  She denies any other concerns today.   Visit Diagnosis:    ICD-10-CM   1. Schizoaffective disorder, bipolar type (HCC)  F25.0     2. Insomnia, unspecified type  G47.00 traZODone  (DESYREL ) 50 MG tablet      Past Psychiatric History: I have reviewed past psychiatric history from progress note on 08/08/2021.  Past trials of medications like Zyprexa, gabapentin , multiple others.  Past Medical History:  Past Medical History:  Diagnosis Date   Anxiety    Asthma    Depression    Fetal alcohol syndrome    Migraines    Osteoporosis    Scoliosis    Seizures (HCC)     Past Surgical History:  Procedure Laterality Date   FRACTURE SURGERY     HAND SURGERY     TONSILECTOMY, ADENOIDECTOMY, BILATERAL MYRINGOTOMY AND TUBES     WRIST SURGERY      Family Psychiatric History: I have reviewed family psychiatric history from progress note on 08/08/2021.  Family History:  Family History  Problem Relation Age of Onset   Drug abuse Other    Mental illness Other     Social History: I have reviewed social history from progress note on 08/08/2021.   Social History   Socioeconomic History   Marital status: Married    Spouse name: Not on file   Number of children: Not on file   Years of education: Not on file   Highest education level: GED or equivalent  Occupational  History   Not on file  Tobacco Use   Smoking status: Former    Types: Cigarettes   Smokeless tobacco: Never   Tobacco comments:    1 cig per day reported 03/14/2020  Vaping Use   Vaping status: Some Days  Substance and Sexual Activity   Alcohol use: Yes    Comment: socially   Drug use: Never   Sexual activity: Yes    Birth control/protection: I.U.D.  Other Topics Concern   Not on file  Social History Narrative   ** Merged History Encounter **       Social Drivers of Health   Financial Resource Strain: Low Risk  (07/09/2023)   Overall Financial Resource Strain (CARDIA)     Difficulty of Paying Living Expenses: Not hard at all  Food Insecurity: No Food Insecurity (07/09/2023)   Hunger Vital Sign    Worried About Running Out of Food in the Last Year: Never true    Ran Out of Food in the Last Year: Never true  Transportation Needs: No Transportation Needs (07/09/2023)   PRAPARE - Administrator, Civil Service (Medical): No    Lack of Transportation (Non-Medical): No  Physical Activity: Insufficiently Active (07/09/2023)   Exercise Vital Sign    Days of Exercise per Week: 1 day    Minutes of Exercise per Session: 10 min  Stress: Stress Concern Present (07/09/2023)   [employer] of Occupational Health - Occupational Stress Questionnaire    Feeling of Stress : To some extent  Social Connections: Moderately Isolated (07/09/2023)   Social Connection and Isolation Panel    Frequency of Communication with Friends and Family: More than three times a week    Frequency of Social Gatherings with Friends and Family: Once a week    Attends Religious Services: Never    Database administrator or Organizations: No    Attends Engineer, structural: Not on file    Marital Status: Married    Allergies:  Allergies  Allergen Reactions   Ibuprofen Itching and Nausea And Vomiting    Abdominal pain, upset stomach  History of ulcer  Abdominal pain, upset stomach    History of ulcer    Abdominal pain, upset stomach    Metabolic Disorder Labs: Lab Results  Component Value Date   HGBA1C 3.9 (L) 02/02/2023   MPG 65.23 02/02/2023   Lab Results  Component Value Date   PROLACTIN 8.8 02/02/2023   PROLACTIN 5.8 02/14/2021   Lab Results  Component Value Date   CHOL 150 03/27/2022   TRIG 36 03/27/2022   HDL 64 03/27/2022   LDLCALC 77 03/27/2022   LDLCALC 77 02/14/2021   Lab Results  Component Value Date   TSH 0.817 06/18/2023   TSH 0.830 03/27/2022    Therapeutic Level Labs: No results found for: LITHIUM No results found for:  VALPROATE No results found for: CBMZ  Current Medications: Current Outpatient Medications  Medication Sig Dispense Refill   albuterol  (VENTOLIN  HFA) 108 (90 Base) MCG/ACT inhaler Inhale 2 puffs into the lungs every 4 (four) hours as needed for wheezing or shortness of breath. 8 g 12   budesonide -formoterol  (SYMBICORT ) 160-4.5 MCG/ACT inhaler Inhale 2 puffs into the lungs 2 (two) times daily. 10.2 g 12   busPIRone  (BUSPAR ) 15 MG tablet TAKE 1 TABLET(15 MG) BY MOUTH TWICE DAILY 180 tablet 1   EMGALITY 120 MG/ML SOAJ ADMINISTER 120 MG UNDER THE SKIN MONTHLY     gabapentin  (NEURONTIN ) 400 MG  capsule Take 1 capsule (400 mg total) by mouth 3 (three) times daily. 270 capsule 0   levonorgestrel (LILETTA, 52 MG,) 19.5 MCG/DAY IUD IUD 1 each by Intrauterine route once.     meclizine  (ANTIVERT ) 12.5 MG tablet Take 1 tablet (12.5 mg total) by mouth 3 (three) times daily as needed for dizziness. 90 tablet 0   nortriptyline (PAMELOR) 10 MG capsule Take 10 mg by mouth every morning.     nortriptyline (PAMELOR) 50 MG capsule Take 50 mg by mouth at bedtime.     ondansetron  (ZOFRAN ) 4 MG tablet Take 4 mg by mouth.     QUEtiapine  (SEROQUEL ) 25 MG tablet Take 2 tablets (50 mg total) by mouth at bedtime. 180 tablet 1   topiramate (TOPAMAX) 100 MG tablet Take 1 tablet by mouth at bedtime.     Vitamin D , Ergocalciferol , (DRISDOL ) 1.25 MG (50000 UNIT) CAPS capsule Take 1 capsule (50,000 Units total) by mouth once a week. 12 capsule 1   zoledronic  acid (RECLAST ) 5 MG/100ML SOLN injection Inject 5 mg into the vein once. Once yearly     zolmitriptan (ZOMIG) 5 MG tablet Take 5 mg by mouth as needed for migraine.     traZODone  (DESYREL ) 50 MG tablet Take 1.5 tablets (75 mg total) by mouth at bedtime as needed for sleep. 45 tablet 1   No current facility-administered medications for this visit.     Musculoskeletal: Strength & Muscle Tone: within normal limits Gait & Station: normal, has a cane with her for  support Patient leans: N/A  Psychiatric Specialty Exam: Review of Systems  Psychiatric/Behavioral:  Positive for hallucinations and sleep disturbance. The patient is nervous/anxious.     Blood pressure 99/68, pulse (!) 102, temperature (!) 97.5 F (36.4 C), temperature source Temporal, height 4' 7 (1.397 m), weight 95 lb 12.8 oz (43.5 kg).Body mass index is 22.27 kg/m.  General Appearance: Casual  Eye Contact:  Fair  Speech:  Clear and Coherent  Volume:  Normal  Mood:  Anxious  Affect:  Congruent  Thought Process:  Goal Directed and Descriptions of Associations: Intact  Orientation:  Full (Time, Place, and Person)  Thought Content: Hallucinations: Auditory - whispering - chronic  Suicidal Thoughts:  No  Homicidal Thoughts:  No  Memory:  Immediate;   Fair Recent;   Fair Remote;   Fair  Judgement:  Fair  Insight:  Fair  Psychomotor Activity:  Normal  Concentration:  Concentration: Fair and Attention Span: Fair  Recall:  [employer] of Knowledge: Fair  Language: Fair  Akathisia:  No  Handed:  Right  AIMS (if indicated): done  Assets:  Communication Skills Desire for Improvement Housing Social Support Talents/Skills Transportation  ADL's:  Intact  Cognition: WNL  Sleep:  Poor   Screenings: Geneticist, molecular Office Visit from 10/19/2023 in [facility] Office Visit from 02/02/2023 in [facility] Office Visit from 05/13/2022 in [facility] Office Visit from 10/29/2021 in [facility] Video Visit from 08/08/2021 in [facility]  AIMS Total Score 0 0 0 0 0   AUDIT    Flowsheet Row Clinical Support from 01/05/2022 in [facility]  Alcohol Use Disorder Identification Test Final Score (AUDIT) 4   GAD-7    Flowsheet Row Office Visit from 08/20/2023 in  [facility] Office Visit from 07/09/2023 in [facility] Office Visit from 06/18/2023 in [facility] Office Visit from 02/02/2023 in [facility] Office Visit from 05/13/2022 in [facility]  Total GAD-7 Score 2 2 0 1 0   PHQ2-9    Flowsheet Row Office Visit from 08/20/2023 in [facility] Office Visit from 07/09/2023 in [facility] Office Visit from 06/18/2023 in [facility] Office Visit from 02/02/2023 in [facility] Video Visit from 10/07/2022 in [facility]  PHQ-2 Total Score 0 2 0 0 0  PHQ-9 Total Score 4 6 4  -- --   Flowsheet Row Office Visit from 10/19/2023 in [facility] Video Visit from 08/19/2023 in [facility] Video Visit from 05/03/2023 in [facility]  C-SSRS RISK CATEGORY Moderate Risk Moderate Risk Moderate Risk     Assessment and Plan: Athanasia Stanwood is a 33 year old Caucasian female, married, on SSI, lives in [place], has a history of schizoaffective disorder, was evaluated in office today for a follow-up appointment.  Schizoaffective disorder-stable Currently well managed on current medication regimen. Continue Seroquel  50 mg at bedtime Continue BuSpar  15 mg twice a day, currently uses it as once a day.  Insomnia-unstable Currently struggling with sleep problems since the past couple of weeks. Increase Trazodone  to 75 mg at bedtime as needed. She does have Nortriptyline prescribed for migraine headaches per neurology.  Discussed drug to drug interaction including serotonin syndrome. Continue sleep hygiene techniques.  Follow-up Follow-up in clinic in 3  months or sooner if needed.    Consent: Patient/Guardian gives verbal consent for treatment and assignment of benefits for services provided during this visit. Patient/Guardian expressed understanding and agreed to proceed.   This note was generated in part or whole with voice recognition software. Voice recognition is usually quite accurate but there are transcription errors that can and very often do occur. I apologize for any typographical errors that were not detected and corrected.    Mushka Laconte, MD 10/19/2023, 1:30 PM

## 2023-10-19 NOTE — Patient Instructions (Signed)
Serotonin Syndrome Serotonin is a chemical that helps to control several functions in the body. This chemical is also called a neurotransmitter. It controls: Brain and nerve cell function. Mood and emotions. Memory. Eating. Sleeping. Sexual activity. Stress response. Having too much serotonin in your body can cause serotonin syndrome. This condition can be harmful to your brain and nerve cells. This can be a life-threatening condition. What are the causes? This condition may be caused by taking medicines or drugs that increase the level of serotonin in your body, such as: Antidepressant medicines. Migraine medicines. Certain pain medicines. Certain drugs, including ecstasy, LSD, cocaine, and amphetamines. Over-the-counter cough or cold medicines that contain dextromethorphan. Certain herbal supplements, including St. John's wort, ginseng, and nutmeg. This condition usually occurs when you take these medicines or drugs together, but it can also happen with a high dose of a single medicine or drug. What increases the risk? You are more likely to develop this condition if: You just started taking a medicine or drug that increases the level of serotonin in the body. You recently increased the dose of a medicine or drug that increases the level of serotonin in the body. You take more than one medicine or drug that increases the level of serotonin in the body. What are the signs or symptoms? Symptoms of this condition usually start within several hours of taking a medicine or drug. Symptoms may be mild or severe. Mild symptoms include: Sweating. Restlessness or agitation. Muscle twitching or stiffness. Rapid heart rate. Nausea, vomiting, or diarrhea. Shivering or goose bumps. Confusion. Severe symptoms include: Irregular heartbeat. Seizures. Loss of consciousness. High fever. How is this diagnosed? This condition may be diagnosed based on: Your medical history. A physical  exam. Your prior use of drugs and medicines. Blood or urine tests. These may be used to rule out other causes of your symptoms. How is this treated? The treatment for this condition depends on the severity of your symptoms. For mild cases, stopping the medicine or drug that caused your condition is usually all that is needed. For moderate to severe cases, treatment in a hospital may be needed to prevent or treat life-threatening symptoms. Treatment may include: Medicines to control your symptoms. IV fluids. Actions to support your breathing. Treatments to control your body temperature. Follow these instructions at home: Medicines  Take over-the-counter and prescription medicines only as told by your health care provider. Check with your health care provider before you start taking any new prescriptions, over-the-counter medicines, herbs, or supplements. Do not combine any medicines that can cause this condition. Lifestyle  Maintain a healthy lifestyle. Eat a healthy diet that includes plenty of vegetables, fruits, whole grains, low-fat dairy products, and lean protein. Do not eat a lot of foods that are high in fat, added sugars, or salt. Get the right amount and quality of sleep. Most adults need 7-9 hours of sleep each night. Make time to exercise, even if it is only for short periods of time. Most adults should exercise for at least 150 minutes each week. Do not drink alcohol. Do not use illegal drugs. Do not take medicines for reasons other than they are prescribed. General instructions Do not use any products that contain nicotine or tobacco. These products include cigarettes, chewing tobacco, and vaping devices, such as e-cigarettes. If you need help quitting, ask your health care provider. Contact a health care provider if: Your symptoms do not improve or they get worse. Get help right away if: You have worsening  confusion, severe headache, chest pain, high fever, seizures, or  loss of consciousness. You experience serious side effects of medicine, such as swelling of your face, lips, tongue, or throat. These symptoms may be an emergency. Get help right away. Call 911. Do not wait to see if the symptoms will go away. Do not drive yourself to the hospital. Also, get help right away if: You have serious thoughts about hurting yourself or others. Take one of these steps if you feel like you may hurt yourself or others, or have thoughts about taking your own life: Go to your nearest emergency room. Call 911. Call the National Suicide Prevention Lifeline at 276-283-0524 or 988. This is open 24 hours a day. Text the Crisis Text Line at 337-730-6713. Summary Serotonin is a chemical that helps to control several functions in the body. High levels of serotonin in the body can cause serotonin syndrome, which can be life-threatening. This condition may be caused by taking medicines or drugs that increase the level of serotonin in your body. Treatment depends on the severity of your symptoms. For mild cases, stopping the medicine or drug that caused your condition is usually all that is needed. Check with your health care provider before you start taking any new prescriptions, over-the-counter medicines, herbs, or supplements. This information is not intended to replace advice given to you by your health care provider. Make sure you discuss any questions you have with your health care provider. Document Revised: 07/03/2021 Document Reviewed: 07/03/2021 Elsevier Patient Education  2024 [employer].

## 2023-10-26 ENCOUNTER — Encounter: Payer: Self-pay | Admitting: Family Medicine

## 2023-10-26 NOTE — Telephone Encounter (Signed)
 Virtual visit if in [place] (ok to double book at 3PM today) If not in [place] will need to go to [facility]

## 2023-11-04 ENCOUNTER — Telehealth: Payer: Self-pay

## 2023-11-04 DIAGNOSIS — F250 Schizoaffective disorder, bipolar type: Secondary | ICD-10-CM

## 2023-11-04 NOTE — Telephone Encounter (Signed)
 pharmacy called states that pt needs refills on the quetiapine  90 day supply. pt was last seen on 6-24 next appt 9-22

## 2023-11-05 MED ORDER — QUETIAPINE FUMARATE 25 MG PO TABS: 50.0000 mg | ORAL_TABLET | Freq: Every day | ORAL | 3 refills | Status: AC

## 2023-11-05 NOTE — Telephone Encounter (Signed)
 left message that rx was sent to the pharmacy

## 2023-11-05 NOTE — Telephone Encounter (Signed)
 I have sent Seroquel to pharmacy as requested.

## 2023-11-09 ENCOUNTER — Encounter: Payer: Self-pay | Admitting: Family Medicine

## 2023-11-16 ENCOUNTER — Telehealth: Payer: Self-pay | Admitting: Family Medicine

## 2023-11-16 ENCOUNTER — Ambulatory Visit: Payer: Self-pay

## 2023-11-16 DIAGNOSIS — T161XXA Foreign body in right ear, initial encounter: Secondary | ICD-10-CM | POA: Diagnosis not present

## 2023-11-16 DIAGNOSIS — H60331 Swimmer's ear, right ear: Secondary | ICD-10-CM | POA: Diagnosis not present

## 2023-11-16 NOTE — Telephone Encounter (Signed)
 Copied from CRM [id number]. Topic: Referral - Question >> Nov 16, 2023 10:17 AM Larissa RAMAN wrote: Reason for CRM: Elvie, with [facility] hearing and speech needs referral and notes for patient. Patient is scheduled for an appointment on tomorrow, 7/23 and they need documents faxed over as soon as possible.  Fax # [phone] Callback # [phone]

## 2023-11-16 NOTE — Telephone Encounter (Signed)
 FYI Only or Action Required?: FYI only for provider.  Patient was last seen in primary care on 08/20/2023 by Vicci Duwaine SQUIBB, DO.  Called Nurse Triage reporting Foreign Body in Ear.  Symptoms began today.  Interventions attempted: Other: confirmed object is stuck with ear camera.  Symptoms are: right ear foreign bodyunchanged.  Triage Disposition: See Physician Within 24 Hours  Patient/caregiver understands and will follow disposition?: Yes             Copied from CRM [id number]. Topic: Clinical - Red Word Triage >> Nov 16, 2023  8:31 AM Larissa RAMAN wrote: [facility] that prompted transfer to Nurse Triage: RT ear- hearing aid stuck in ear canal Reason for Disposition  [1] Foreign body AND [2] can't remove at home  Answer Assessment - Initial Assessment Questions 1. OBJECT: What do you think it is?      Tip of hearing aid- silicone cap/piece. Right ear.  2. PAIN: Is it causing any pain? If Yes, ask: How bad is the pain?  (Scale 0-10; or none, mild, moderate, severe)     No pain or discomfort.  3. ONSET: How long do you think it's been in there?     An hour. Patient states she did not try to remove the object, she just confirmed with her ear camera it is stuck.  4. DISCHARGE: Has there been any discharge or bleeding from the ear? If Yes, ask: Please describe it. (e.g., clear, cloudy, blood)     No.  5. OTHER SYMPTOMS: Do you have any other symptoms? (e.g., decreased hearing, dizziness)     Ringing in ears, little dizziness cause of the ear ringing. Patient states the ear ringing has been an issue since February and is treated by wearing her hearing aids.  6. PREGNANCY: Is there any chance you are pregnant? When was your last menstrual period?     No, patient states she has an IUD.  Protocols used: Ear - Foreign Body-A-AH

## 2023-11-17 DIAGNOSIS — Z974 Presence of external hearing-aid: Secondary | ICD-10-CM | POA: Diagnosis not present

## 2023-11-17 DIAGNOSIS — G43809 Other migraine, not intractable, without status migrainosus: Secondary | ICD-10-CM | POA: Diagnosis not present

## 2023-11-17 DIAGNOSIS — R42 Dizziness and giddiness: Secondary | ICD-10-CM | POA: Diagnosis not present

## 2023-11-17 DIAGNOSIS — H9313 Tinnitus, bilateral: Secondary | ICD-10-CM | POA: Diagnosis not present

## 2023-11-17 NOTE — Telephone Encounter (Signed)
 Faxed over 7/23@1 :34pm

## 2023-11-18 ENCOUNTER — Telehealth: Payer: Self-pay | Admitting: Family Medicine

## 2023-11-18 NOTE — Telephone Encounter (Signed)
 Copied from CRM #[id number]. Topic: Medicare AWV >> Nov 18, 2023  2:55 PM Nathanel DEL wrote: Reason for CRM: Called LVM 11/18/2023 to schedule AWV. Please schedule Virtual or Telehealth visits ONLY.   Nathanel Paschal; Care Guide Ambulatory Clinical Support [place] [facility] Direct Dial: [phone]

## 2023-11-24 ENCOUNTER — Ambulatory Visit (INDEPENDENT_AMBULATORY_CARE_PROVIDER_SITE_OTHER): Admitting: Family Medicine

## 2023-11-24 VITALS — BP 91/63 | HR 89 | Temp 97.7°F | Ht <= 58 in | Wt 96.0 lb

## 2023-11-24 DIAGNOSIS — Z0000 Encounter for general adult medical examination without abnormal findings: Secondary | ICD-10-CM

## 2023-11-24 DIAGNOSIS — K5909 Other constipation: Secondary | ICD-10-CM

## 2023-11-24 DIAGNOSIS — Z789 Other specified health status: Secondary | ICD-10-CM

## 2023-11-24 DIAGNOSIS — M810 Age-related osteoporosis without current pathological fracture: Secondary | ICD-10-CM | POA: Diagnosis not present

## 2023-11-24 DIAGNOSIS — Z136 Encounter for screening for cardiovascular disorders: Secondary | ICD-10-CM | POA: Diagnosis not present

## 2023-11-24 DIAGNOSIS — E559 Vitamin D deficiency, unspecified: Secondary | ICD-10-CM

## 2023-11-24 DIAGNOSIS — R42 Dizziness and giddiness: Secondary | ICD-10-CM

## 2023-11-24 MED ORDER — BUDESONIDE-FORMOTEROL FUMARATE 160-4.5 MCG/ACT IN AERO: 2.0000 | INHALATION_SPRAY | Freq: Two times a day (BID) | RESPIRATORY_TRACT | 12 refills | Status: AC

## 2023-11-24 MED ORDER — LUBIPROSTONE 24 MCG PO CAPS: 24.0000 ug | ORAL_CAPSULE | Freq: Two times a day (BID) | ORAL | 3 refills | Status: DC

## 2023-11-24 MED ORDER — ALBUTEROL SULFATE HFA 108 (90 BASE) MCG/ACT IN AERS: 2.0000 | INHALATION_SPRAY | RESPIRATORY_TRACT | 12 refills | Status: AC | PRN

## 2023-11-24 NOTE — Assessment & Plan Note (Signed)
 Rechecking labs today. Await results. Treat as needed.

## 2023-11-24 NOTE — Patient Instructions (Addendum)
 Please call to schedule your mammogram and/or bone density: [facility]  Address: [address], [place], [place] [address] Phone: [phone]  [facility] [address] [place],  [place]  [address] Phone: [phone]    Ms. Stacy Taylor , Thank you for taking time to come for your Medicare Wellness Visit. I appreciate your ongoing commitment to your health goals. Please review the following plan we discussed and let me know if I can assist you in the future.   These are the goals we discussed:  Goals      Patient Stated     01/03/2021, no goals     Patient Stated     Would like to get a promotion at work        This is a list of the screening recommended for you and due dates:  Health Maintenance  Topic Date Due   Hepatitis B Vaccine (1 of 3 - 19+ 3-dose series) Never done   Flu Shot  11/26/2023   Medicare Annual Wellness Visit  11/23/2024   Pap with HPV screening  03/14/2026   DTaP/Tdap/Td vaccine (2 - Td or Tdap) 08/18/2030   Hepatitis C Screening  Completed   HIV Screening  Completed   Meningitis B Vaccine  Aged Out   Pneumococcal Vaccination  Discontinued   HPV Vaccine  Discontinued   COVID-19 Vaccine  Discontinued   Preventative Services:  Health Risk Assessment and Personalized Prevention Plan: done today Bone Mass Measurements: ordered today Breast Cancer Screening: N/A CVD Screening: done today Cervical Cancer Screening: up to date Colon Cancer Screening: N/A Depression Screening: done today Diabetes Screening: done today Glaucoma Screening: see your eye doctor Hepatitis B vaccine: Checking status today Hepatitis C screening: up to date HIV Screening: up to date Flu Vaccine: get in the fall Lung cancer Screening: N/A Obesity Screening: done today Pneumonia Vaccines (2): up to date STI Screening: N/A

## 2023-11-24 NOTE — Assessment & Plan Note (Signed)
DEXA ordered today. Await results.  

## 2023-11-24 NOTE — Progress Notes (Signed)
 BP 91/63   Pulse 89   Temp 97.7 F (36.5 C) (Oral)   Ht 4' 7 (1.397 m)   Wt 96 lb (43.5 kg)   SpO2 99%   BMI 22.31 kg/m    Subjective:    Patient ID: Stacy Taylor, female    DOB: 03/14/1991, 33 y.o.   MRN: [id number]  HPI: Stacy Taylor is a 33 y.o. female presenting on 11/24/2023 for comprehensive medical examination. Current medical complaints include:  Dizziness is not any better. Has been following with the specialists. Still not able to walk downstairs or drive on her own. Still not able to work.   ABDOMINAL ISSUES Duration: months Nature: cramping, constipation, bloating Location: diffuse  Severity: moderate  Radiation: no Frequency: constant Alleviating factors: OTC laxitives, but then comes back Aggravating factors: nothing Treatments attempted: laxatives, miralax, probiotics, mag citrate, colace, ducalax Constipation: yes Diarrhea: no Mucous in the stool: no Heartburn: no Bloating:yes Flatulence: no Nausea: no Vomiting: no Melena or hematochezia: no Rash: no Jaundice: no Fever: no Weight loss: no  Menopausal Symptoms: no  Functional Status Survey: Is the patient deaf or have difficulty hearing?: No Does the patient have difficulty seeing, even when wearing glasses/contacts?: No Does the patient have difficulty concentrating, remembering, or making decisions?: No Does the patient have difficulty walking or climbing stairs?: No Does the patient have difficulty dressing or bathing?: No Does the patient have difficulty doing errands alone such as visiting a doctor's office or shopping?: No     11/24/2023    3:02 PM 07/09/2023   10:40 AM 03/27/2022    1:53 PM 01/05/2022    1:05 PM 01/03/2021    1:49 PM  Fall Risk   Falls in the past year? 1 0 0 0 0  Number falls in past yr: 1 0 0 0   Injury with Fall? 1 0 0 0   Risk for fall due to : History of fall(s);Impaired balance/gait Impaired balance/gait No Fall Risks  Medication side effect  Follow up  Falls evaluation completed Falls evaluation completed Falls evaluation completed  Falls evaluation completed;Education provided;Falls prevention discussed  Falls evaluation completed;Education provided;Falls prevention discussed      Data saved with a previous flowsheet row definition    Depression Screen    11/24/2023    2:27 PM 08/20/2023   10:15 AM 07/09/2023   10:40 AM 06/18/2023   10:34 AM 02/02/2023    2:22 PM  Depression screen PHQ 2/9  Decreased Interest 0 0 1 0   Down, Depressed, Hopeless 0 0 1 0   PHQ - 2 Score 0 0 2 0   Altered sleeping 0 2 0 0   Tired, decreased energy 0 0 1 2   Change in appetite 0 0 0 0   Feeling bad or failure about yourself  0 2 2 0   Trouble concentrating 0 0 1 1   Moving slowly or fidgety/restless 0 0 0 1   Suicidal thoughts  0 0 0   PHQ-9 Score 0 4 6 4    Difficult doing work/chores Not difficult at all Somewhat difficult  Somewhat difficult      Information is confidential and restricted. Go to Review Flowsheets to unlock data.     Advanced Directives Does patient have a HCPOA?    no If yes, name and contact information:  Does patient have a living will or MOST form?  no  Past Medical History:  Past Medical History:  Diagnosis Date   Anxiety  Asthma    Depression    Fetal alcohol syndrome    Heart murmur    Migraines    Osteoporosis    Scoliosis    Seizures (HCC)     Surgical History:  Past Surgical History:  Procedure Laterality Date   FRACTURE SURGERY     HAND SURGERY     TONSILECTOMY, ADENOIDECTOMY, BILATERAL MYRINGOTOMY AND TUBES     WRIST SURGERY      Medications:  Current Outpatient Medications on File Prior to Visit  Medication Sig   busPIRone  (BUSPAR ) 15 MG tablet TAKE 1 TABLET(15 MG) BY MOUTH TWICE DAILY   EMGALITY 120 MG/ML SOAJ ADMINISTER 120 MG UNDER THE SKIN MONTHLY   gabapentin  (NEURONTIN ) 400 MG capsule Take 1 capsule (400 mg total) by mouth 3 (three) times daily.   levonorgestrel (LILETTA, 52 MG,) 19.5  MCG/DAY IUD IUD 1 each by Intrauterine route once.   meclizine  (ANTIVERT ) 12.5 MG tablet Take 1 tablet (12.5 mg total) by mouth 3 (three) times daily as needed for dizziness.   nortriptyline (PAMELOR) 10 MG capsule Take 10 mg by mouth every morning.   nortriptyline (PAMELOR) 50 MG capsule Take 50 mg by mouth at bedtime.   ondansetron  (ZOFRAN ) 4 MG tablet Take 4 mg by mouth.   QUEtiapine  (SEROQUEL ) 25 MG tablet Take 2 tablets (50 mg total) by mouth at bedtime.   topiramate (TOPAMAX) 100 MG tablet Take 1 tablet by mouth at bedtime.   traZODone  (DESYREL ) 50 MG tablet Take 1.5 tablets (75 mg total) by mouth at bedtime as needed for sleep.   Vitamin D , Ergocalciferol , (DRISDOL ) 1.25 MG (50000 UNIT) CAPS capsule Take 1 capsule (50,000 Units total) by mouth once a week.   zoledronic  acid (RECLAST ) 5 MG/100ML SOLN injection Inject 5 mg into the vein once. Once yearly   zolmitriptan (ZOMIG) 5 MG tablet Take 5 mg by mouth as needed for migraine.   No current facility-administered medications on file prior to visit.    Allergies:  Allergies  Allergen Reactions   Ibuprofen Itching and Nausea And Vomiting    Abdominal pain, upset stomach  History of ulcer  Abdominal pain, upset stomach    History of ulcer    Abdominal pain, upset stomach    Social History:  Social History   Socioeconomic History   Marital status: Married    Spouse name: Not on file   Number of children: Not on file   Years of education: Not on file   Highest education level: GED or equivalent  Occupational History   Not on file  Tobacco Use   Smoking status: Former    Types: Cigarettes   Smokeless tobacco: Never   Tobacco comments:    1 cig per day reported 03/14/2020  Vaping Use   Vaping status: Some Days  Substance and Sexual Activity   Alcohol use: Yes    Comment: socially   Drug use: Never   Sexual activity: Yes    Birth control/protection: I.U.D.  Other Topics Concern   Not on file  Social History  Narrative   ** Merged History Encounter **       Social Drivers of Health   Financial Resource Strain: Low Risk  (11/24/2023)   Overall Financial Resource Strain (CARDIA)    Difficulty of Paying Living Expenses: Not very hard  Food Insecurity: Food Insecurity Present (11/24/2023)   Hunger Vital Sign    Worried About Running Out of Food in the Last Year: Sometimes true  Ran Out of Food in the Last Year: Sometimes true  Transportation Needs: No Transportation Needs (11/24/2023)   PRAPARE - Administrator, Civil Service (Medical): No    Lack of Transportation (Non-Medical): No  Physical Activity: Inactive (11/24/2023)   Exercise Vital Sign    Days of Exercise per Week: 0 days    Minutes of Exercise per Session: Not on file  Stress: No Stress Concern Present (11/24/2023)   [employer] of Occupational Health - Occupational Stress Questionnaire    Feeling of Stress: Not at all  Social Connections: Moderately Isolated (11/24/2023)   Social Connection and Isolation Panel    Frequency of Communication with Friends and Family: More than three times a week    Frequency of Social Gatherings with Friends and Family: Once a week    Attends Religious Services: Never    Database administrator or Organizations: No    Attends Engineer, structural: Not on file    Marital Status: Married  Catering manager Violence: Not At Risk (11/24/2023)   Humiliation, Afraid, Rape, and Kick questionnaire    Fear of Current or Ex-Partner: No    Emotionally Abused: No    Physically Abused: No    Sexually Abused: No   Social History   Tobacco Use  Smoking Status Former   Types: Cigarettes  Smokeless Tobacco Never  Tobacco Comments   1 cig per day reported 03/14/2020   Social History   Substance and Sexual Activity  Alcohol Use Yes   Comment: socially    Family History:  Family History  Problem Relation Age of Onset   Drug abuse Other    Mental illness Other     Past  medical history, surgical history, medications, allergies, family history and social history reviewed with patient today and changes made to appropriate areas of the chart.   Review of Systems  Constitutional: Negative.   HENT: Negative.         Itchy R ear- comes and goes   Eyes: Negative.   Respiratory: Negative.    Cardiovascular: Negative.   Gastrointestinal:  Positive for abdominal pain and constipation. Negative for blood in stool, diarrhea, heartburn, melena, nausea and vomiting.  Genitourinary: Negative.   Musculoskeletal: Negative.   Skin: Negative.   Neurological:  Positive for dizziness. Negative for tingling, tremors, sensory change, speech change, focal weakness, seizures, loss of consciousness, weakness and headaches.  Endo/Heme/Allergies:  Positive for polydipsia. Negative for environmental allergies. Bruises/bleeds easily.  Psychiatric/Behavioral: Negative.      All other ROS negative except what is listed above and in the HPI.      Objective:    BP 91/63   Pulse 89   Temp 97.7 F (36.5 C) (Oral)   Ht 4' 7 (1.397 m)   Wt 96 lb (43.5 kg)   SpO2 99%   BMI 22.31 kg/m   Wt Readings from Last 3 Encounters:  11/24/23 96 lb (43.5 kg)  08/20/23 94 lb 3.2 oz (42.7 kg)  07/09/23 94 lb 3.2 oz (42.7 kg)     Physical Exam Vitals and nursing note reviewed.  Constitutional:      General: She is not in acute distress.    Appearance: Normal appearance. She is not ill-appearing, toxic-appearing or diaphoretic.  HENT:     Head: Normocephalic and atraumatic.     Right Ear: Tympanic membrane, ear canal and external ear normal. There is no impacted cerumen.     Left Ear: Tympanic membrane, ear  canal and external ear normal. There is no impacted cerumen.     Nose: Nose normal. No congestion or rhinorrhea.     Mouth/Throat:     Mouth: Mucous membranes are moist.     Pharynx: Oropharynx is clear. No oropharyngeal exudate or posterior oropharyngeal erythema.  Eyes:      General: No scleral icterus.       Right eye: No discharge.        Left eye: No discharge.     Extraocular Movements: Extraocular movements intact.     Conjunctiva/sclera: Conjunctivae normal.     Pupils: Pupils are equal, round, and reactive to light.  Neck:     Vascular: No carotid bruit.  Cardiovascular:     Rate and Rhythm: Normal rate and regular rhythm.     Pulses: Normal pulses.     Heart sounds: No murmur heard.    No friction rub. No gallop.  Pulmonary:     Effort: Pulmonary effort is normal. No respiratory distress.     Breath sounds: Normal breath sounds. No stridor. No wheezing, rhonchi or rales.  Chest:     Chest wall: No tenderness.  Abdominal:     General: Abdomen is flat. Bowel sounds are normal. There is no distension.     Palpations: Abdomen is soft. There is no mass.     Tenderness: There is no abdominal tenderness. There is no right CVA tenderness, left CVA tenderness, guarding or rebound.     Hernia: No hernia is present.  Genitourinary:    Comments: Breast and pelvic exams deferred with shared decision making Musculoskeletal:        General: No swelling, tenderness, deformity or signs of injury.     Cervical back: Normal range of motion and neck supple. No rigidity. No muscular tenderness.     Right lower leg: No edema.     Left lower leg: No edema.  Lymphadenopathy:     Cervical: No cervical adenopathy.  Skin:    General: Skin is warm and dry.     Capillary Refill: Capillary refill takes less than 2 seconds.     Coloration: Skin is not jaundiced or pale.     Findings: No bruising, erythema, lesion or rash.  Neurological:     General: No focal deficit present.     Mental Status: She is alert and oriented to person, place, and time. Mental status is at baseline.     Cranial Nerves: No cranial nerve deficit.     Sensory: No sensory deficit.     Motor: No weakness.     Coordination: Coordination normal.     Gait: Gait normal.     Deep Tendon Reflexes:  Reflexes normal.  Psychiatric:        Mood and Affect: Mood normal.        Behavior: Behavior normal.        Thought Content: Thought content normal.        Judgment: Judgment normal.        11/24/2023    2:30 PM 01/05/2022    1:07 PM 01/03/2021    1:51 PM  6CIT Screen  What Year? 0 points 0 points 0 points  What month? 0 points 0 points 0 points  What time? 0 points 0 points 0 points  Count back from 20 0 points 0 points 0 points  Months in reverse 2 points 2 points 0 points  Repeat phrase 0 points 0 points 4 points  Total Score 2  points 2 points 4 points    Results for orders placed or performed in visit on 06/18/23  TSH   Collection Time: 06/18/23 11:22 AM  Result Value Ref Range   TSH 0.817 0.450 - 4.500 uIU/mL  Comprehensive metabolic panel   Collection Time: 06/18/23 11:22 AM  Result Value Ref Range   Glucose 83 70 - 99 mg/dL   BUN 16 6 - 20 mg/dL   Creatinine, Ser 9.01 0.57 - 1.00 mg/dL   eGFR 78 >40 fO/fpw/8.26   BUN/Creatinine Ratio 16 9 - 23   Sodium 142 134 - 144 mmol/L   Potassium 3.7 3.5 - 5.2 mmol/L   Chloride 108 (H) 96 - 106 mmol/L   CO2 18 (L) 20 - 29 mmol/L   Calcium 8.9 8.7 - 10.2 mg/dL   Total Protein 6.9 6.0 - 8.5 g/dL   Albumin 4.6 3.9 - 4.9 g/dL   Globulin, Total 2.3 1.5 - 4.5 g/dL   Bilirubin Total 0.5 0.0 - 1.2 mg/dL   Alkaline Phosphatase 50 44 - 121 IU/L   AST 14 0 - 40 IU/L   ALT 9 0 - 32 IU/L  CBC with Differential/Platelet   Collection Time: 06/18/23 11:22 AM  Result Value Ref Range   WBC 6.5 3.4 - 10.8 x10E3/uL   RBC 4.11 3.77 - 5.28 x10E6/uL   Hemoglobin 13.6 11.1 - 15.9 g/dL   Hematocrit 59.8 65.9 - 46.6 %   MCV 98 (H) 79 - 97 fL   MCH 33.1 (H) 26.6 - 33.0 pg   MCHC 33.9 31.5 - 35.7 g/dL   RDW 87.7 88.2 - 84.5 %   Platelets 157 150 - 450 x10E3/uL   Neutrophils 57 Not Estab. %   Lymphs 36 Not Estab. %   Monocytes 5 Not Estab. %   Eos 1 Not Estab. %   Basos 1 Not Estab. %   Neutrophils Absolute 3.7 1.4 - 7.0 x10E3/uL    Lymphocytes Absolute 2.4 0.7 - 3.1 x10E3/uL   Monocytes Absolute 0.3 0.1 - 0.9 x10E3/uL   EOS (ABSOLUTE) 0.1 0.0 - 0.4 x10E3/uL   Basophils Absolute 0.1 0.0 - 0.2 x10E3/uL   Immature Granulocytes 0 Not Estab. %   Immature Grans (Abs) 0.0 0.0 - 0.1 x10E3/uL  Ferritin   Collection Time: 06/18/23 11:22 AM  Result Value Ref Range   Ferritin 148 15 - 150 ng/mL  Iron   Collection Time: 06/18/23 11:22 AM  Result Value Ref Range   Iron 77 27 - 159 ug/dL      Assessment & Plan:   Problem List Items Addressed This Visit       Digestive   Chronic constipation   On OTC laxatives and still having BM every 10 days. Will start her on amitiza . Call with any concerns.         Musculoskeletal and Integument   Osteoporosis without current pathological fracture   DEXA ordered today. Await results.       Relevant Orders   DG Bone Density     Other   Dizziness   Continue to follow with specialists. Stable. Continue to monitor.       Relevant Orders   CBC with Differential/Platelet   Comprehensive metabolic panel with GFR   TSH   Vitamin D  deficiency   Rechecking labs today. Await results. Treat as needed.       Relevant Orders   VITAMIN D  25 Hydroxy (Vit-D Deficiency, Fractures)   Other Visit Diagnoses  Encounter for Medicare annual wellness exam    -  Primary   Preventative care discussed today as below.     Routine general medical examination at a health care facility       Vaccines up to date/declined. Screening labs checked today. Pap up to date. DEXA ordered. Continue diet and exercise. Call with any concerns.     Hepatitis B vaccination status unknown       Labs drawn today. Await results.   Relevant Orders   Hepatitis B surface antibody,quantitative     Screening for cardiovascular condition       Labs drawn today. Await results.   Relevant Orders   Lipid Panel w/o Chol/HDL Ratio        Preventative Services:  Health Risk Assessment and Personalized  Prevention Plan: done today Bone Mass Measurements: ordered today Breast Cancer Screening: N/A CVD Screening: done today Cervical Cancer Screening: up to date Colon Cancer Screening: N/A Depression Screening: done today Diabetes Screening: done today Glaucoma Screening: see your eye doctor Hepatitis B vaccine: Checking status today Hepatitis C screening: up to date HIV Screening: up to date Flu Vaccine: get in the fall Lung cancer Screening: N/A Obesity Screening: done today Pneumonia Vaccines (2): up to date STI Screening: N/A  Follow up plan: Return 4-6 weeks follow up constipation.   LABORATORY TESTING:  - Pap smear: up to date  IMMUNIZATIONS:   - Tdap: Tetanus vaccination status reviewed: last tetanus booster within 10 years. - Influenza: Postponed to flu season - Pneumovax: Up to date - Prevnar: Refused - Zostavax vaccine: Not applicable  SCREENING: - Bone Density: Ordered today   PATIENT COUNSELING:   Advised to take 1 mg of folate supplement per day if capable of pregnancy.   Sexuality: Discussed sexually transmitted diseases, partner selection, use of condoms, avoidance of unintended pregnancy  and contraceptive alternatives.   Advised to avoid cigarette smoking.  I discussed with the patient that most people either abstain from alcohol or drink within safe limits (<=14/week and <=4 drinks/occasion for males, <=7/weeks and <= 3 drinks/occasion for females) and that the risk for alcohol disorders and other health effects rises proportionally with the number of drinks per week and how often a drinker exceeds daily limits.  Discussed cessation/primary prevention of drug use and availability of treatment for abuse.   Diet: Encouraged to adjust caloric intake to maintain  or achieve ideal body weight, to reduce intake of dietary saturated fat and total fat, to limit sodium intake by avoiding high sodium foods and not adding table salt, and to maintain adequate dietary  potassium and calcium preferably from fresh fruits, vegetables, and low-fat dairy products.    stressed the importance of regular exercise  Injury prevention: Discussed safety belts, safety helmets, smoke detector, smoking near bedding or upholstery.   Dental health: Discussed importance of regular tooth brushing, flossing, and dental visits.    NEXT PREVENTATIVE PHYSICAL DUE IN 1 YEAR. Return 4-6 weeks follow up constipation.

## 2023-11-24 NOTE — Assessment & Plan Note (Signed)
 Continue to follow with specialists. Stable. Continue to monitor.

## 2023-11-24 NOTE — Assessment & Plan Note (Signed)
 On OTC laxatives and still having BM every 10 days. Will start her on amitiza . Call with any concerns.

## 2023-11-24 NOTE — Progress Notes (Signed)
 Subjective:   Stacy Taylor is a 33 y.o. female who presents for Medicare Annual (Subsequent) preventive examination.  Visit Complete: In person  Patient Medicare AWV questionnaire was completed by the patient on 11/24/23; I have confirmed that all information answered by patient is correct and no changes since this date.  Cardiac Risk Factors include: smoking/ tobacco exposure     Objective:    Today's Vitals   11/24/23 1430 11/24/23 1436  BP: 91/63 91/63  Pulse: 89 89  Temp: 97.7 F (36.5 C) 97.7 F (36.5 C)  TempSrc: Oral Oral  SpO2: 99% 99%  Weight: 96 lb (43.5 kg) 96 lb (43.5 kg)  Height: 4' 7 (1.397 m)   PainSc: 0-No pain 0-No pain   Body mass index is 22.31 kg/m.     11/24/2023    3:03 PM 05/29/2022    1:15 PM 08/27/2021   10:41 AM 04/29/2021    9:00 AM 01/03/2021    1:48 PM 08/21/2020    9:03 AM 08/20/2020    3:40 PM  Advanced Directives  Does Patient Have a Medical Advance Directive? No No No No No No Unable to assess, patient is non-responsive or altered mental status;No  Would patient like information on creating a medical advance directive? Yes (MAU/Ambulatory/Procedural Areas - Information given)  No - Patient declined No - Patient declined  No - Patient declined No - Patient declined    Current Medications (verified) Outpatient Encounter Medications as of 11/24/2023  Medication Sig   busPIRone  (BUSPAR ) 15 MG tablet TAKE 1 TABLET(15 MG) BY MOUTH TWICE DAILY   EMGALITY 120 MG/ML SOAJ ADMINISTER 120 MG UNDER THE SKIN MONTHLY   gabapentin  (NEURONTIN ) 400 MG capsule Take 1 capsule (400 mg total) by mouth 3 (three) times daily.   levonorgestrel (LILETTA, 52 MG,) 19.5 MCG/DAY IUD IUD 1 each by Intrauterine route once.   lubiprostone  (AMITIZA ) 24 MCG capsule Take 1 capsule (24 mcg total) by mouth 2 (two) times daily with a meal.   meclizine  (ANTIVERT ) 12.5 MG tablet Take 1 tablet (12.5 mg total) by mouth 3 (three) times daily as needed for dizziness.    nortriptyline (PAMELOR) 10 MG capsule Take 10 mg by mouth every morning.   nortriptyline (PAMELOR) 50 MG capsule Take 50 mg by mouth at bedtime.   ondansetron  (ZOFRAN ) 4 MG tablet Take 4 mg by mouth.   QUEtiapine  (SEROQUEL ) 25 MG tablet Take 2 tablets (50 mg total) by mouth at bedtime.   topiramate (TOPAMAX) 100 MG tablet Take 1 tablet by mouth at bedtime.   traZODone  (DESYREL ) 50 MG tablet Take 1.5 tablets (75 mg total) by mouth at bedtime as needed for sleep.   Vitamin D , Ergocalciferol , (DRISDOL ) 1.25 MG (50000 UNIT) CAPS capsule Take 1 capsule (50,000 Units total) by mouth once a week.   zoledronic  acid (RECLAST ) 5 MG/100ML SOLN injection Inject 5 mg into the vein once. Once yearly   zolmitriptan (ZOMIG) 5 MG tablet Take 5 mg by mouth as needed for migraine.   [DISCONTINUED] albuterol  (VENTOLIN  HFA) 108 (90 Base) MCG/ACT inhaler Inhale 2 puffs into the lungs every 4 (four) hours as needed for wheezing or shortness of breath.   [DISCONTINUED] budesonide -formoterol  (SYMBICORT ) 160-4.5 MCG/ACT inhaler Inhale 2 puffs into the lungs 2 (two) times daily.   albuterol  (VENTOLIN  HFA) 108 (90 Base) MCG/ACT inhaler Inhale 2 puffs into the lungs every 4 (four) hours as needed for wheezing or shortness of breath.   budesonide -formoterol  (SYMBICORT ) 160-4.5 MCG/ACT inhaler Inhale 2 puffs into  the lungs 2 (two) times daily.   No facility-administered encounter medications on file as of 11/24/2023.    Allergies (verified) Ibuprofen   History: Past Medical History:  Diagnosis Date   Anxiety    Asthma    Depression    Fetal alcohol syndrome    Heart murmur    Migraines    Osteoporosis    Scoliosis    Seizures (HCC)    Past Surgical History:  Procedure Laterality Date   FRACTURE SURGERY     HAND SURGERY     TONSILECTOMY, ADENOIDECTOMY, BILATERAL MYRINGOTOMY AND TUBES     WRIST SURGERY     Family History  Problem Relation Age of Onset   Drug abuse Other    Mental illness Other    Social  History   Socioeconomic History   Marital status: Married    Spouse name: Not on file   Number of children: Not on file   Years of education: Not on file   Highest education level: GED or equivalent  Occupational History   Not on file  Tobacco Use   Smoking status: Former    Types: Cigarettes   Smokeless tobacco: Never   Tobacco comments:    1 cig per day reported 03/14/2020  Vaping Use   Vaping status: Some Days  Substance and Sexual Activity   Alcohol use: Yes    Comment: socially   Drug use: Never   Sexual activity: Yes    Birth control/protection: I.U.D.  Other Topics Concern   Not on file  Social History Narrative   ** Merged History Encounter **       Social Drivers of Health   Financial Resource Strain: Low Risk  (11/24/2023)   Overall Financial Resource Strain (CARDIA)    Difficulty of Paying Living Expenses: Not very hard  Food Insecurity: Food Insecurity Present (11/24/2023)   Hunger Vital Sign    Worried About Running Out of Food in the Last Year: Sometimes true    Ran Out of Food in the Last Year: Sometimes true  Transportation Needs: No Transportation Needs (11/24/2023)   PRAPARE - Administrator, Civil Service (Medical): No    Lack of Transportation (Non-Medical): No  Physical Activity: Inactive (11/24/2023)   Exercise Vital Sign    Days of Exercise per Week: 0 days    Minutes of Exercise per Session: Not on file  Stress: No Stress Concern Present (11/24/2023)   [employer] of Occupational Health - Occupational Stress Questionnaire    Feeling of Stress: Not at all  Social Connections: Moderately Isolated (11/24/2023)   Social Connection and Isolation Panel    Frequency of Communication with Friends and Family: More than three times a week    Frequency of Social Gatherings with Friends and Family: Once a week    Attends Religious Services: Never    Database administrator or Organizations: No    Attends Engineer, structural: Not  on file    Marital Status: Married    Tobacco Counseling Counseling given: Not Answered Tobacco comments: 1 cig per day reported 03/14/2020   Clinical Intake:     Pain : No/denies pain Pain Score: 0-No pain     Diabetes: No  How often do you need to have someone help you when you read instructions, pamphlets, or other written materials from your doctor or pharmacy?: 1 - Never  Interpreter Needed?: No      Activities of Daily Living  11/24/2023    2:31 PM  In your present state of health, do you have any difficulty performing the following activities:  Hearing? 0  Vision? 0  Difficulty concentrating or making decisions? 0  Walking or climbing stairs? 0  Dressing or bathing? 0  Doing errands, shopping? 0  Preparing Food and eating ? N  Using the Toilet? N  In the past six months, have you accidently leaked urine? N  Do you have problems with loss of bowel control? N  Managing your Medications? N  Managing your Finances? N  Housekeeping or managing your Housekeeping? N    Patient Care Team: Vicci Duwaine SQUIBB, DO as PCP - General (Family Medicine) Cindie Ole DASEN, MD as PCP - Electrophysiology (Cardiology) Stuart Vernell Norris, PA-C (Family Medicine)  Indicate any recent Medical Services you may have received from other than [facility] providers in the past year (date may be approximate).     Assessment:   This is a routine wellness examination for Keysi.  Hearing/Vision screen No results found.   Goals Addressed   None    Depression Screen    11/24/2023    2:27 PM 08/20/2023   10:15 AM 07/09/2023   10:40 AM 06/18/2023   10:34 AM 02/02/2023    2:22 PM 10/07/2022    2:40 PM 05/13/2022    4:53 PM  PHQ 2/9 Scores  PHQ - 2 Score 0 0 2 0     PHQ- 9 Score 0 4 6 4         Information is confidential and restricted. Go to Review Flowsheets to unlock data.    Fall Risk    11/24/2023    3:02 PM 07/09/2023   10:40 AM 03/27/2022    1:53 PM 01/05/2022     1:05 PM 01/03/2021    1:49 PM  Fall Risk   Falls in the past year? 1 0 0 0 0  Number falls in past yr: 1 0 0 0   Injury with Fall? 1 0 0 0   Risk for fall due to : History of fall(s);Impaired balance/gait Impaired balance/gait No Fall Risks  Medication side effect  Follow up Falls evaluation completed Falls evaluation completed Falls evaluation completed  Falls evaluation completed;Education provided;Falls prevention discussed  Falls evaluation completed;Education provided;Falls prevention discussed      Data saved with a previous flowsheet row definition    MEDICARE RISK AT HOME:    TIMED UP AND GO:  Was the test performed?  Yes  Length of time to ambulate 10 feet: 8 sec Gait steady and fast without use of assistive device    Cognitive Function:        11/24/2023    2:30 PM 01/05/2022    1:07 PM 01/03/2021    1:51 PM  6CIT Screen  What Year? 0 points 0 points 0 points  What month? 0 points 0 points 0 points  What time? 0 points 0 points 0 points  Count back from 20 0 points 0 points 0 points  Months in reverse 2 points 2 points 0 points  Repeat phrase 0 points 0 points 4 points  Total Score 2 points 2 points 4 points    Immunizations Immunization History  Administered Date(s) Administered   Influenza,inj,Quad PF,6+ Mos 02/14/2021, 03/27/2022   Influenza-Unspecified 02/14/2020   PFIZER(Purple Top)SARS-COV-2 Vaccination 09/03/2019, 09/24/2019   Pneumococcal Polysaccharide-23 01/26/2020   Tdap 08/17/2020    TDAP status: Up to date  Get Flu in the Fall  Pneumococcal vaccine  status: Up to date  Covid-19 vaccine status: Declined, Education has been provided regarding the importance of this vaccine but patient still declined. Advised may receive this vaccine at local pharmacy or Health Dept.or vaccine clinic. Aware to provide a copy of the vaccination record if obtained from local pharmacy or Health Dept. Verbalized acceptance and understanding.  Qualifies for Shingles  Vaccine? No   Zostavax completed No    Screening Tests Health Maintenance  Topic Date Due   Hepatitis B Vaccines (1 of 3 - 19+ 3-dose series) Never done   INFLUENZA VACCINE  11/26/2023   Medicare Annual Wellness (AWV)  11/23/2024   Cervical Cancer Screening (HPV/Pap Cotest)  03/14/2026   DTaP/Tdap/Td (2 - Td or Tdap) 08/18/2030   Hepatitis C Screening  Completed   HIV Screening  Completed   Meningococcal B Vaccine  Aged Out   Pneumococcal Vaccine 63-62 Years old  Discontinued   HPV VACCINES  Discontinued   COVID-19 Vaccine  Discontinued    Health Maintenance  Health Maintenance Due  Topic Date Due   Hepatitis B Vaccines (1 of 3 - 19+ 3-dose series) Never done    Colorectal screening not indicated due to age  Mammogram not indicated due to age  Bone Density status: Ordered 11/24/23. Pt provided with contact info and advised to call to schedule appt.  Lung Cancer Screening: (Low Dose CT Chest recommended if Age 75-80 years, 20 pack-year currently smoking OR have quit w/in 15years.) does not qualify.    Additional Screening:  Hepatitis C Screening: does qualify; Completed 02/14/21  Dental Screening: Recommended annual dental exams for proper oral hygiene  Community Resource Referral / Chronic Care Management: CRR required this visit?  No   CCM required this visit?  No     Plan:     I have personally reviewed and noted the following in the patient's chart:   Medical and social history Use of alcohol, tobacco or illicit drugs  Current medications and supplements including opioid prescriptions. Patient is not currently taking opioid prescriptions. Functional ability and status Nutritional status Physical activity Advanced directives List of other physicians Hospitalizations, surgeries, and ER visits in previous 12 months Vitals Screenings to include cognitive, depression, and falls Referrals and appointments  In addition, I have reviewed and discussed with  patient certain preventive protocols, quality metrics, and best practice recommendations. A written personalized care plan for preventive services as well as general preventive health recommendations were provided to patient.     Duwaine Louder, DO   11/24/2023   After Visit Summary: (In Person-Printed) AVS printed and given to the patient

## 2023-11-25 ENCOUNTER — Encounter: Payer: Self-pay | Admitting: Family Medicine

## 2023-11-25 ENCOUNTER — Ambulatory Visit: Payer: Self-pay | Admitting: Family Medicine

## 2023-11-25 LAB — COMPREHENSIVE METABOLIC PANEL WITH GFR
ALT: 6 IU/L (ref 0–32)
AST: 8 IU/L (ref 0–40)
Albumin: 4.2 g/dL (ref 3.9–4.9)
Alkaline Phosphatase: 50 IU/L (ref 44–121)
BUN/Creatinine Ratio: 12 (ref 9–23)
BUN: 11 mg/dL (ref 6–20)
Bilirubin Total: 0.4 mg/dL (ref 0.0–1.2)
CO2: 18 mmol/L — ABNORMAL LOW (ref 20–29)
Calcium: 8.5 mg/dL — ABNORMAL LOW (ref 8.7–10.2)
Chloride: 109 mmol/L — ABNORMAL HIGH (ref 96–106)
Creatinine, Ser: 0.93 mg/dL (ref 0.57–1.00)
Globulin, Total: 2.1 g/dL (ref 1.5–4.5)
Glucose: 82 mg/dL (ref 70–99)
Potassium: 3.5 mmol/L (ref 3.5–5.2)
Sodium: 140 mmol/L (ref 134–144)
Total Protein: 6.3 g/dL (ref 6.0–8.5)
eGFR: 83 mL/min/1.73 (ref >59)

## 2023-11-25 LAB — CBC WITH DIFFERENTIAL/PLATELET
Basophils Absolute: 0 x10E3/uL (ref 0.0–0.2)
Basos: 1 %
EOS (ABSOLUTE): 0.1 x10E3/uL (ref 0.0–0.4)
Eos: 2 %
Hematocrit: 38 % (ref 34.0–46.6)
Hemoglobin: 12.8 g/dL (ref 11.1–15.9)
Immature Grans (Abs): 0 x10E3/uL (ref 0.0–0.1)
Immature Granulocytes: 0 %
Lymphocytes Absolute: 2.3 x10E3/uL (ref 0.7–3.1)
Lymphs: 38 %
MCH: 33.8 pg — ABNORMAL HIGH (ref 26.6–33.0)
MCHC: 33.7 g/dL (ref 31.5–35.7)
MCV: 100 fL — ABNORMAL HIGH (ref 79–97)
Monocytes Absolute: 0.4 x10E3/uL (ref 0.1–0.9)
Monocytes: 6 %
Neutrophils Absolute: 3.3 x10E3/uL (ref 1.4–7.0)
Neutrophils: 52 %
Platelets: 153 x10E3/uL (ref 150–450)
RBC: 3.79 x10E6/uL (ref 3.77–5.28)
RDW: 12.5 % (ref 11.7–15.4)
WBC: 6.1 x10E3/uL (ref 3.4–10.8)

## 2023-11-25 LAB — LIPID PANEL W/O CHOL/HDL RATIO
Cholesterol, Total: 125 mg/dL (ref 100–199)
HDL: 40 mg/dL (ref >39)
LDL Chol Calc (NIH): 72 mg/dL (ref 0–99)
Triglycerides: 60 mg/dL (ref 0–149)
VLDL Cholesterol Cal: 13 mg/dL (ref 5–40)

## 2023-11-25 LAB — HEPATITIS B SURFACE ANTIBODY, QUANTITATIVE: Hepatitis B Surf Ab Quant: <3.5 m[IU]/mL — ABNORMAL LOW

## 2023-11-25 LAB — VITAMIN D 25 HYDROXY (VIT D DEFICIENCY, FRACTURES): Vit D, 25-Hydroxy: 23.9 ng/mL — ABNORMAL LOW (ref 30.0–100.0)

## 2023-11-25 LAB — TSH: TSH: 0.983 u[IU]/mL (ref 0.450–4.500)

## 2023-11-25 MED ORDER — VITAMIN D (ERGOCALCIFEROL) 1.25 MG (50000 UNIT) PO CAPS: 50000.0000 [IU] | ORAL_CAPSULE | ORAL | 1 refills | Status: DC

## 2023-11-26 ENCOUNTER — Other Ambulatory Visit: Payer: Self-pay | Admitting: Family Medicine

## 2023-11-26 DIAGNOSIS — J45909 Unspecified asthma, uncomplicated: Secondary | ICD-10-CM | POA: Diagnosis not present

## 2023-11-26 DIAGNOSIS — F209 Schizophrenia, unspecified: Secondary | ICD-10-CM | POA: Diagnosis not present

## 2023-11-26 DIAGNOSIS — G43909 Migraine, unspecified, not intractable, without status migrainosus: Secondary | ICD-10-CM | POA: Diagnosis not present

## 2023-11-26 DIAGNOSIS — F419 Anxiety disorder, unspecified: Secondary | ICD-10-CM | POA: Diagnosis not present

## 2023-11-26 DIAGNOSIS — G40909 Epilepsy, unspecified, not intractable, without status epilepticus: Secondary | ICD-10-CM | POA: Diagnosis not present

## 2023-11-26 DIAGNOSIS — F411 Generalized anxiety disorder: Secondary | ICD-10-CM | POA: Diagnosis not present

## 2023-11-26 DIAGNOSIS — E559 Vitamin D deficiency, unspecified: Secondary | ICD-10-CM | POA: Diagnosis not present

## 2023-11-26 DIAGNOSIS — G4700 Insomnia, unspecified: Secondary | ICD-10-CM | POA: Diagnosis not present

## 2023-11-26 DIAGNOSIS — F3342 Major depressive disorder, recurrent, in full remission: Secondary | ICD-10-CM | POA: Diagnosis not present

## 2023-11-26 DIAGNOSIS — F17200 Nicotine dependence, unspecified, uncomplicated: Secondary | ICD-10-CM | POA: Diagnosis not present

## 2023-11-26 DIAGNOSIS — G8929 Other chronic pain: Secondary | ICD-10-CM | POA: Diagnosis not present

## 2023-11-26 DIAGNOSIS — K5900 Constipation, unspecified: Secondary | ICD-10-CM | POA: Diagnosis not present

## 2023-11-26 LAB — HM DEXA SCAN

## 2023-11-26 MED ORDER — LINACLOTIDE 145 MCG PO CAPS: 145.0000 ug | ORAL_CAPSULE | Freq: Every day | ORAL | 6 refills | Status: AC

## 2023-12-06 ENCOUNTER — Encounter: Payer: Self-pay | Admitting: Family Medicine

## 2023-12-06 MED ORDER — ONDANSETRON HCL 4 MG PO TABS: 4.0000 mg | ORAL_TABLET | Freq: Three times a day (TID) | ORAL | 3 refills | Status: DC | PRN

## 2023-12-09 ENCOUNTER — Other Ambulatory Visit (HOSPITAL_COMMUNITY): Payer: Self-pay

## 2023-12-16 ENCOUNTER — Other Ambulatory Visit (HOSPITAL_COMMUNITY): Payer: Self-pay

## 2023-12-16 ENCOUNTER — Telehealth: Payer: Self-pay | Admitting: Pharmacy Technician

## 2023-12-16 NOTE — Telephone Encounter (Addendum)
 Pharmacy Patient Advocate Encounter  Received notification from [facility] [employer] that Prior Authorization for Ondansetron  4mg  has been DENIED.  Full denial letter will be uploaded to the media tab. See denial reason below.  We denied this request under Medicare Part D because: Drug not prescribed for a medically accepted indication.- (Ondansetron  is only FDA approved for certain types (dues to certain situations) of Nausea/Vomiting. the requested drug must meet the following therapeutic criteria: diagnosis of chemotherapy or radiation induced nausea/vomiting or prophylaxis of post-surgical nausea/vomiting, before induction of anesthesia or shortly after surgery.  However, she can get it through one of the [facility] for less than $20 without using her insurance. I think she can get a 30 count for $15 on our discount program, but the whole #60 was $16 something when I checked it, just using our usual cash pricing system. This price could fluctuate some depending on what we have in stock at the time and how the cost fluctuates from time to time.

## 2023-12-17 NOTE — Telephone Encounter (Signed)
 Recommend getting out of pocket

## 2023-12-29 ENCOUNTER — Other Ambulatory Visit: Payer: Self-pay | Admitting: Medical Genetics

## 2024-01-03 ENCOUNTER — Other Ambulatory Visit
Admission: RE | Admit: 2024-01-03 | Discharge: 2024-01-03 | Disposition: A | Discharge status: Home / Self Care | Payer: Self-pay | Source: Ambulatory Visit | Attending: Medical Genetics | Admitting: Medical Genetics

## 2024-01-05 ENCOUNTER — Ambulatory Visit (INDEPENDENT_AMBULATORY_CARE_PROVIDER_SITE_OTHER): Admitting: Family Medicine

## 2024-01-05 ENCOUNTER — Encounter: Payer: Self-pay | Admitting: Family Medicine

## 2024-01-05 VITALS — BP 102/70 | HR 108 | Temp 97.5°F | Ht <= 58 in | Wt 96.2 lb

## 2024-01-05 DIAGNOSIS — M2550 Pain in unspecified joint: Secondary | ICD-10-CM | POA: Diagnosis not present

## 2024-01-05 DIAGNOSIS — M810 Age-related osteoporosis without current pathological fracture: Secondary | ICD-10-CM | POA: Diagnosis not present

## 2024-01-05 DIAGNOSIS — R42 Dizziness and giddiness: Secondary | ICD-10-CM | POA: Diagnosis not present

## 2024-01-05 DIAGNOSIS — Z23 Encounter for immunization: Secondary | ICD-10-CM

## 2024-01-05 DIAGNOSIS — K5909 Other constipation: Secondary | ICD-10-CM | POA: Diagnosis not present

## 2024-01-05 MED ORDER — MECLIZINE HCL 25 MG PO TABS: 25.0000 mg | ORAL_TABLET | Freq: Three times a day (TID) | ORAL | 1 refills | Status: AC | PRN

## 2024-01-05 MED ORDER — ONDANSETRON HCL 4 MG PO TABS: 4.0000 mg | ORAL_TABLET | Freq: Three times a day (TID) | ORAL | 6 refills | Status: AC | PRN

## 2024-01-05 MED ORDER — DICLOFENAC SODIUM 1 % EX GEL: 4.0000 g | Freq: Four times a day (QID) | CUTANEOUS | 1 refills | Status: AC

## 2024-01-05 NOTE — Patient Instructions (Signed)
Please call to schedule your bone density: [facility]  Address: [address], [place], [place] [address] Phone: [phone]  [facility] [place] [address] [place],  [place]  [address] Phone: [phone]   

## 2024-01-05 NOTE — Progress Notes (Signed)
 BP 102/70 (BP Location: Left Arm, Patient Position: Sitting, Cuff Size: Normal)   Pulse (!) 108   Temp (!) 97.5 F (36.4 C) (Oral)   Ht 4' 7 (1.397 m)   Wt 96 lb 3.2 oz (43.6 kg)   BMI 22.36 kg/m    Subjective:    Patient ID: Stacy Taylor, female    DOB: 02-23-1991, 33 y.o.   MRN: [id number]  HPI: Stacy Taylor is a 33 y.o. female  Chief Complaint  Patient presents with   Follow-up    States nothing is better, suppose to bone scan and stated no one called    Arthritis    Knees and hands are always cold and knees pop when walking    Having a normal BM every 4 days. No bloating. No pain. Feeling much much better.   Still dizzy and having issues with going up stairs, driving on bigger roads and other activities. Work up was pretty normal- it's improving, but slowly. She is going to start work again. She's looking forward to that.  Has been having coldness and snapping in her hands and her knees. Starting to hurt. Will get lighter on the skin, but no significant color change. Unsure what makes them better, worse with moving them and cold. No other concerns or complaints at this time.   Relevant past medical, surgical, family and social history reviewed and updated as indicated. Interim medical history since our last visit reviewed. Allergies and medications reviewed and updated.  Review of Systems  Constitutional:  Positive for fatigue. Negative for activity change, appetite change, chills, diaphoresis, fever and unexpected weight change.  Respiratory: Negative.    Cardiovascular: Negative.   Musculoskeletal: Negative.   Skin: Negative.   Neurological:  Positive for dizziness and weakness. Negative for tremors, seizures, syncope, facial asymmetry, speech difficulty, light-headedness, numbness and headaches.  Psychiatric/Behavioral: Negative.      Per HPI unless specifically indicated above     Objective:    BP 102/70 (BP Location: Left Arm, Patient Position:  Sitting, Cuff Size: Normal)   Pulse (!) 108   Temp (!) 97.5 F (36.4 C) (Oral)   Ht 4' 7 (1.397 m)   Wt 96 lb 3.2 oz (43.6 kg)   BMI 22.36 kg/m   Wt Readings from Last 3 Encounters:  01/05/24 96 lb 3.2 oz (43.6 kg)  11/24/23 96 lb (43.5 kg)  08/20/23 94 lb 3.2 oz (42.7 kg)    Physical Exam Vitals and nursing note reviewed.  Constitutional:      General: She is not in acute distress.    Appearance: Normal appearance. She is normal weight. She is not ill-appearing, toxic-appearing or diaphoretic.  HENT:     Head: Normocephalic and atraumatic.     Right Ear: External ear normal.     Left Ear: External ear normal.     Nose: Nose normal.     Mouth/Throat:     Mouth: Mucous membranes are moist.     Pharynx: Oropharynx is clear.  Eyes:     General: No scleral icterus.       Right eye: No discharge.        Left eye: No discharge.     Extraocular Movements: Extraocular movements intact.     Conjunctiva/sclera: Conjunctivae normal.     Pupils: Pupils are equal, round, and reactive to light.  Cardiovascular:     Rate and Rhythm: Normal rate and regular rhythm.     Pulses: Normal pulses.  Heart sounds: Normal heart sounds. No murmur heard.    No friction rub. No gallop.  Pulmonary:     Effort: Pulmonary effort is normal. No respiratory distress.     Breath sounds: Normal breath sounds. No stridor. No wheezing, rhonchi or rales.  Chest:     Chest wall: No tenderness.  Musculoskeletal:        General: Normal range of motion.     Cervical back: Normal range of motion and neck supple.  Skin:    General: Skin is warm and dry.     Capillary Refill: Capillary refill takes less than 2 seconds.     Coloration: Skin is not jaundiced or pale.     Findings: No bruising, erythema, lesion or rash.  Neurological:     General: No focal deficit present.     Mental Status: She is alert and oriented to person, place, and time. Mental status is at baseline.  Psychiatric:        Mood and  Affect: Mood normal.        Behavior: Behavior normal.        Thought Content: Thought content normal.        Judgment: Judgment normal.     Results for orders placed or performed in visit on 12/28/23  HM DEXA SCAN   Collection Time: 11/26/23 12:05 PM  Result Value Ref Range   HM Dexa Scan Osteoporosis       Assessment & Plan:   Problem List Items Addressed This Visit       Digestive   Chronic constipation - Primary   Significantly better on the linzess . Continue linzess . Continue to monitor. Call with any concerns.         Musculoskeletal and Integument   Osteoporosis without current pathological fracture   Due for DEXA. Information to call and schedule given today.         Other   Dizziness   Better, but not resolved. Continue meclizine . Going back to work. Call with any concerns.       Other Visit Diagnoses       Arthralgia, unspecified joint       Will check x-rays. Await results. Start voltaren . Call with any concerns.   Relevant Orders   DG Knee Complete 4 Views Left   DG Knee Complete 4 Views Right   DG Hand Complete Left   DG Hand Complete Right     Needs flu shot       Flu shot given today.   Relevant Orders   Flu vaccine trivalent PF, 6mos and older(Flulaval,Afluria,Fluarix,Fluzone) (Completed)        Follow up plan: Return in about 5 months (around 06/06/2024).

## 2024-01-05 NOTE — Assessment & Plan Note (Signed)
 Better, but not resolved. Continue meclizine . Going back to work. Call with any concerns.

## 2024-01-05 NOTE — Assessment & Plan Note (Signed)
 Due for DEXA. Information to call and schedule given today.

## 2024-01-05 NOTE — Assessment & Plan Note (Signed)
 Significantly better on the linzess . Continue linzess . Continue to monitor. Call with any concerns.

## 2024-01-11 LAB — GENECONNECT MOLECULAR SCREEN: Genetic Analysis Overall Interpretation: NEGATIVE

## 2024-01-17 ENCOUNTER — Telehealth: Admitting: Psychiatry

## 2024-01-19 ENCOUNTER — Encounter: Payer: Self-pay | Admitting: Psychiatry

## 2024-01-19 ENCOUNTER — Telehealth: Admitting: Psychiatry

## 2024-01-19 DIAGNOSIS — G4701 Insomnia due to medical condition: Secondary | ICD-10-CM | POA: Diagnosis not present

## 2024-01-19 DIAGNOSIS — F250 Schizoaffective disorder, bipolar type: Secondary | ICD-10-CM

## 2024-01-19 NOTE — Progress Notes (Signed)
 Virtual Visit via Video Note  I connected with Stacy Taylor on 01/19/24 at  4:00 PM EDT by a video enabled telemedicine application and verified that I am speaking with the correct person using two identifiers.  Location Provider Location : [facility] Patient Location : Home  Participants: Patient , Provider   I discussed the limitations of evaluation and management by telemedicine and the availability of in person appointments. The patient expressed understanding and agreed to proceed.   I discussed the assessment and treatment plan with the patient. The patient was provided an opportunity to ask questions and all were answered. The patient agreed with the plan and demonstrated an understanding of the instructions.   The patient was advised to call back or seek an in-person evaluation if the symptoms worsen or if the condition fails to improve as anticipated.    BH MD OP Progress Note  01/20/2024 4:14 PM Stacy Taylor  MRN:  [id number]  Chief Complaint:  Chief Complaint  Patient presents with   Follow-up   Depression   Medication Refill   Insomnia   Discussed the use of AI scribe software for clinical note transcription with the patient, who gave verbal consent to proceed.  History of Present Illness Stacy Taylor is a 33 year old Caucasian female, lives in [place], married, has a history of schizoaffective disorder, migraine headaches, seizure-like spells, dizziness, tinnitus was evaluated by telemedicine today.  She reports significant improvement in her overall functioning, stating that she has returned to work at [employer] part-time for the past 2 weeks and is managing well. She describes her workplace as supportive and accommodating of her needs.  A longstanding sleep pattern includes going to bed around 10:00-10:30 PM and waking at 3:00-4:00 AM, averaging 5 to 6 hours of sleep per night. She reports waking feeling rested, energetic, and able to function normally  during the day without fatigue or lethargy. Her current medications for sleep include Trazodone  and Seroquel  50 mg at night, along with Buspar  and Nortriptyline.  She does report hair loss unknown if this is a side effect of any of her current medications.  She is planning to talk to her primary care provider regarding this soon.  She is not interested in changing or readjusting any of her medications to address this problem at this time.  She denies any thoughts about hurting herself or others.  She does have a history of chronic hallucinations which are currently manageable and does not distressful.  She denies any other concerns today.   Visit Diagnosis:    ICD-10-CM   1. Schizoaffective disorder, bipolar type (HCC)  F25.0     2. Insomnia due to medical condition  G47.01    Mood symptoms      Past Psychiatric History: I have reviewed past psychiatric history from progress note on 08/08/2021.  Past trials of medications like Zyprexa, gabapentin , multiple others.  Past Medical History:  Past Medical History:  Diagnosis Date   Anxiety    Asthma    Depression    Fetal alcohol syndrome    Heart murmur    Migraines    Osteoporosis    Scoliosis    Seizures (HCC)     Past Surgical History:  Procedure Laterality Date   FRACTURE SURGERY     HAND SURGERY     TONSILECTOMY, ADENOIDECTOMY, BILATERAL MYRINGOTOMY AND TUBES     WRIST SURGERY      Family Psychiatric History: I have reviewed family psychiatric history from progress note on 08/08/2021.  Family History:  Family History  Problem Relation Age of Onset   Drug abuse Other    Mental illness Other     Social History: I have reviewed social history from progress note on 08/08/2021. Social History   Socioeconomic History   Marital status: Married    Spouse name: Not on file   Number of children: Not on file   Years of education: Not on file   Highest education level: GED or equivalent  Occupational History   Not on file   Tobacco Use   Smoking status: Former    Types: Cigarettes   Smokeless tobacco: Never   Tobacco comments:    1 cig per day reported 03/14/2020  Vaping Use   Vaping status: Some Days  Substance and Sexual Activity   Alcohol use: Yes    Comment: socially   Drug use: Never   Sexual activity: Yes    Birth control/protection: I.U.D.  Other Topics Concern   Not on file  Social History Narrative   ** Merged History Encounter **       Social Drivers of Health   Financial Resource Strain: Low Risk  (11/24/2023)   Overall Financial Resource Strain (CARDIA)    Difficulty of Paying Living Expenses: Not very hard  Food Insecurity: Food Insecurity Present (11/24/2023)   Hunger Vital Sign    Worried About Running Out of Food in the Last Year: Sometimes true    Ran Out of Food in the Last Year: Sometimes true  Transportation Needs: No Transportation Needs (11/24/2023)   PRAPARE - Administrator, Civil Service (Medical): No    Lack of Transportation (Non-Medical): No  Physical Activity: Inactive (11/24/2023)   Exercise Vital Sign    Days of Exercise per Week: 0 days    Minutes of Exercise per Session: Not on file  Stress: No Stress Concern Present (11/24/2023)   [employer] of Occupational Health - Occupational Stress Questionnaire    Feeling of Stress: Not at all  Social Connections: Moderately Isolated (11/24/2023)   Social Connection and Isolation Panel    Frequency of Communication with Friends and Family: More than three times a week    Frequency of Social Gatherings with Friends and Family: Once a week    Attends Religious Services: Never    Database Administrator or Organizations: No    Attends Engineer, Structural: Not on file    Marital Status: Married    Allergies:  Allergies  Allergen Reactions   Ibuprofen Itching and Nausea And Vomiting    Abdominal pain, upset stomach  History of ulcer  Abdominal pain, upset stomach    History of ulcer     Abdominal pain, upset stomach    Metabolic Disorder Labs: Lab Results  Component Value Date   HGBA1C 3.9 (L) 02/02/2023   MPG 65.23 02/02/2023   Lab Results  Component Value Date   PROLACTIN 8.8 02/02/2023   PROLACTIN 5.8 02/14/2021   Lab Results  Component Value Date   CHOL 125 11/24/2023   TRIG 60 11/24/2023   HDL 40 11/24/2023   LDLCALC 72 11/24/2023   LDLCALC 77 03/27/2022   Lab Results  Component Value Date   TSH 0.983 11/24/2023   TSH 0.817 06/18/2023    Therapeutic Level Labs: No results found for: LITHIUM No results found for: VALPROATE No results found for: CBMZ  Current Medications: Current Outpatient Medications  Medication Sig Dispense Refill   albuterol  (VENTOLIN  HFA) 108 (90 Base)  MCG/ACT inhaler Inhale 2 puffs into the lungs every 4 (four) hours as needed for wheezing or shortness of breath. 8 g 12   budesonide -formoterol  (SYMBICORT ) 160-4.5 MCG/ACT inhaler Inhale 2 puffs into the lungs 2 (two) times daily. 10.2 g 12   busPIRone  (BUSPAR ) 15 MG tablet TAKE 1 TABLET(15 MG) BY MOUTH TWICE DAILY 180 tablet 1   diclofenac  Sodium (VOLTAREN ) 1 % GEL Apply 4 g topically 4 (four) times daily. 350 g 1   EMGALITY 120 MG/ML SOAJ ADMINISTER 120 MG UNDER THE SKIN MONTHLY     gabapentin  (NEURONTIN ) 400 MG capsule Take 1 capsule (400 mg total) by mouth 3 (three) times daily. 270 capsule 0   levonorgestrel (LILETTA, 52 MG,) 19.5 MCG/DAY IUD IUD 1 each by Intrauterine route once.     linaclotide  (LINZESS ) 145 MCG CAPS capsule Take 1 capsule (145 mcg total) by mouth daily before breakfast. 30 capsule 6   meclizine  (ANTIVERT ) 25 MG tablet Take 1 tablet (25 mg total) by mouth 3 (three) times daily as needed for dizziness. 270 tablet 1   nortriptyline (PAMELOR) 10 MG capsule Take 10 mg by mouth every morning.     nortriptyline (PAMELOR) 50 MG capsule Take 50 mg by mouth at bedtime.     ondansetron  (ZOFRAN ) 4 MG tablet Take 1 tablet (4 mg total) by mouth every 8 (eight)  hours as needed for nausea or vomiting. 60 tablet 6   QUEtiapine  (SEROQUEL ) 25 MG tablet Take 2 tablets (50 mg total) by mouth at bedtime. 180 tablet 3   topiramate (TOPAMAX) 100 MG tablet Take 1 tablet by mouth at bedtime.     traZODone  (DESYREL ) 50 MG tablet Take 1.5 tablets (75 mg total) by mouth at bedtime as needed for sleep. 45 tablet 1   Vitamin D , Ergocalciferol , (DRISDOL ) 1.25 MG (50000 UNIT) CAPS capsule Take 1 capsule (50,000 Units total) by mouth once a week. 12 capsule 1   zoledronic  acid (RECLAST ) 5 MG/100ML SOLN injection Inject 5 mg into the vein once. Once yearly     zolmitriptan (ZOMIG) 5 MG tablet Take 5 mg by mouth as needed for migraine.     No current facility-administered medications for this visit.     Musculoskeletal: Strength & Muscle Tone: UTA Gait & Station: Seated Patient leans: N/A  Psychiatric Specialty Exam: Review of Systems  Psychiatric/Behavioral:  Positive for hallucinations.     There were no vitals taken for this visit.There is no height or weight on file to calculate BMI.  General Appearance: Casual  Eye Contact:  Fair  Speech:  Clear and Coherent  Volume:  Normal  Mood:  Euthymic  Affect:  Congruent  Thought Process:  Goal Directed and Descriptions of Associations: Intact  Orientation:  Full (Time, Place, and Person)  Thought Content: Hallucinations: Auditory, whispering, chronic  Suicidal Thoughts:  No  Homicidal Thoughts:  No  Memory:  Immediate;   Fair Recent;   Fair Remote;   Fair  Judgement:  Fair  Insight:  Fair  Psychomotor Activity:  Normal  Concentration:  Concentration: Fair and Attention Span: Fair  Recall:  [employer] of Knowledge: Fair  Language: Fair  Akathisia:  No  Handed:  Right  AIMS (if indicated): not done  Assets:  Desire for Improvement Housing Social Support Transportation  ADL's:  Intact  Cognition: WNL  Sleep:  Fair   Screenings: Midwife Visit from 10/19/2023 in [facility] Office Visit from  02/02/2023 in [facility] Office Visit from 05/13/2022 in [facility] Office Visit from 10/29/2021 in [facility] Video Visit from 08/08/2021 in [facility]  AIMS Total Score 0 0 0 0 0   AUDIT    Flowsheet Row Clinical Support from 01/05/2022 in [facility]  Alcohol Use Disorder Identification Test Final Score (AUDIT) 4   GAD-7    Flowsheet Row Office Visit from 01/05/2024 in [facility] Office Visit from 08/20/2023 in [facility] Office Visit from 07/09/2023 in [facility] Office Visit from 06/18/2023 in [facility] Office Visit from 02/02/2023 in [facility]  Total GAD-7 Score 0 2 2 0 1   PHQ2-9    Flowsheet Row Office Visit from 01/05/2024 in [facility] Office Visit from 11/24/2023 in [facility] Office Visit from 08/20/2023 in [facility] Office Visit from 07/09/2023 in [facility] Office Visit from 06/18/2023 in [facility]  PHQ-2 Total Score 0 0 0 2 0  PHQ-9 Total Score 2 0 4 6 4    Flowsheet Row Video Visit from 01/19/2024 in [facility] Office Visit from 10/19/2023 in [facility] Video Visit from 08/19/2023 in [facility]  C-SSRS RISK CATEGORY Moderate Risk Moderate Risk Moderate Risk     Assessment and Plan: Stacy Taylor is a 33 year old Caucasian female, married, on SSI, lives in [place], has a history of schizoaffective disorder was evaluated by telemedicine  today.  1. Schizoaffective disorder, bipolar type (HCC)-stable Currently denies any significant concerns.  Does have chronic hallucinations which are not distressing. Continue Seroquel  50 mg at bedtime Continue BuSpar  15 mg twice a day.  2. Insomnia due to medical condition-stable Currently reports sleep is overall good. Continue Trazodone  75 mg at bedtime.  Follow-up Follow-up in clinic in 3 months or sooner if needed.    Collaboration of Care: Collaboration of Care: Primary Care Provider AEB patient to follow-up with primary care provider for hair loss.  Not interested in stopping any of her current psychotropics to address her hair loss.  Discussed side effects of medications like trazodone , nortriptyline and BuSpar  which likely could cause hair loss.  Patient/Guardian was advised Release of Information must be obtained prior to any record release in order to collaborate their care with an outside provider. Patient/Guardian was advised if they have not already done so to contact the registration department to sign all necessary forms in order for us  to release information regarding their care.   Consent: Patient/Guardian gives verbal consent for treatment and assignment of benefits for services provided during this visit. Patient/Guardian expressed understanding and agreed to proceed.   This note was generated in part or whole with voice recognition software. Voice recognition is usually quite accurate but there are transcription errors that can and very often do occur. I apologize for any typographical errors that were not detected and corrected.    Roth Ress, MD 01/20/2024, 8:08 AM

## 2024-01-27 ENCOUNTER — Encounter: Payer: Self-pay | Admitting: Family Medicine

## 2024-02-01 NOTE — Telephone Encounter (Signed)
 appt

## 2024-02-02 NOTE — Telephone Encounter (Signed)
 Scheduled

## 2024-02-03 ENCOUNTER — Ambulatory Visit
Admission: RE | Admit: 2024-02-03 | Discharge: 2024-02-03 | Disposition: A | Discharge status: Home / Self Care | Source: Ambulatory Visit | Attending: Family Medicine | Admitting: Family Medicine

## 2024-02-03 DIAGNOSIS — M810 Age-related osteoporosis without current pathological fracture: Secondary | ICD-10-CM | POA: Insufficient documentation

## 2024-02-03 DIAGNOSIS — M8589 Other specified disorders of bone density and structure, multiple sites: Secondary | ICD-10-CM | POA: Diagnosis not present

## 2024-02-08 ENCOUNTER — Ambulatory Visit (INDEPENDENT_AMBULATORY_CARE_PROVIDER_SITE_OTHER): Admitting: Family Medicine

## 2024-02-08 VITALS — BP 100/67 | HR 94 | Ht <= 58 in | Wt 92.2 lb

## 2024-02-08 DIAGNOSIS — B350 Tinea barbae and tinea capitis: Secondary | ICD-10-CM

## 2024-02-08 DIAGNOSIS — F250 Schizoaffective disorder, bipolar type: Secondary | ICD-10-CM

## 2024-02-08 DIAGNOSIS — L659 Nonscarring hair loss, unspecified: Secondary | ICD-10-CM

## 2024-02-08 DIAGNOSIS — F419 Anxiety disorder, unspecified: Secondary | ICD-10-CM | POA: Diagnosis not present

## 2024-02-08 DIAGNOSIS — F909 Attention-deficit hyperactivity disorder, unspecified type: Secondary | ICD-10-CM | POA: Diagnosis not present

## 2024-02-08 DIAGNOSIS — Z9189 Other specified personal risk factors, not elsewhere classified: Secondary | ICD-10-CM | POA: Diagnosis not present

## 2024-02-08 DIAGNOSIS — Z8659 Personal history of other mental and behavioral disorders: Secondary | ICD-10-CM | POA: Diagnosis not present

## 2024-02-08 DIAGNOSIS — F445 Conversion disorder with seizures or convulsions: Secondary | ICD-10-CM

## 2024-02-08 MED ORDER — KETOCONAZOLE 2 % EX SHAM: 1.0000 | MEDICATED_SHAMPOO | CUTANEOUS | 3 refills | Status: DC

## 2024-02-08 NOTE — Assessment & Plan Note (Signed)
 Would like a second opinion with psychiatry. Referral placed today.

## 2024-02-08 NOTE — Progress Notes (Signed)
 BP 100/67   Pulse 94   Ht 4' 7 (1.397 m)   Wt 92 lb 3.2 oz (41.8 kg)   SpO2 99%   BMI 21.43 kg/m    Subjective:    Patient ID: Stacy Taylor, female    DOB: 1990/09/27, 33 y.o.   MRN: [id number]  HPI: Stacy Taylor is a 33 y.o. female  Chief Complaint  Patient presents with   Alopecia    Onset about 2 weeks ago.    Has noticed clumps of hair coming out for the past 2 weeks. Hasn't done anything to her hair recently. Global - but does have more on the R side of her head than the left. Now only washing her hair about every 5-6 days because when she is rubbing her hair- that makes it worse. Has been feeling really jittery and unfocused lately. Follows with psychiatry- but they are not planning on treating her ADD. She would like a 2nd opinion.   Relevant past medical, surgical, family and social history reviewed and updated as indicated. Interim medical history since our last visit reviewed. Allergies and medications reviewed and updated.  Review of Systems  Constitutional:  Positive for unexpected weight change. Negative for activity change, appetite change, chills, diaphoresis, fatigue and fever.  Respiratory: Negative.    Cardiovascular: Negative.   Gastrointestinal: Negative.   Musculoskeletal: Negative.   Skin: Negative.   Psychiatric/Behavioral: Negative.      Per HPI unless specifically indicated above     Objective:    BP 100/67   Pulse 94   Ht 4' 7 (1.397 m)   Wt 92 lb 3.2 oz (41.8 kg)   SpO2 99%   BMI 21.43 kg/m   Wt Readings from Last 3 Encounters:  02/08/24 92 lb 3.2 oz (41.8 kg)  01/05/24 96 lb 3.2 oz (43.6 kg)  11/24/23 96 lb (43.5 kg)    Physical Exam Vitals and nursing note reviewed.  Constitutional:      General: She is not in acute distress.    Appearance: Normal appearance. She is not ill-appearing, toxic-appearing or diaphoretic.  HENT:     Head: Normocephalic and atraumatic.     Right Ear: External ear normal.     Left Ear:  External ear normal.     Nose: Nose normal.     Mouth/Throat:     Mouth: Mucous membranes are moist.     Pharynx: Oropharynx is clear.  Eyes:     General: No scleral icterus.       Right eye: No discharge.        Left eye: No discharge.     Extraocular Movements: Extraocular movements intact.     Conjunctiva/sclera: Conjunctivae normal.     Pupils: Pupils are equal, round, and reactive to light.  Cardiovascular:     Rate and Rhythm: Normal rate and regular rhythm.     Pulses: Normal pulses.     Heart sounds: Normal heart sounds. No murmur heard.    No friction rub. No gallop.  Pulmonary:     Effort: Pulmonary effort is normal. No respiratory distress.     Breath sounds: Normal breath sounds. No stridor. No wheezing, rhonchi or rales.  Chest:     Chest wall: No tenderness.  Musculoskeletal:        General: Normal range of motion.     Cervical back: Normal range of motion and neck supple.  Skin:    General: Skin is warm and dry.  Capillary Refill: Capillary refill takes less than 2 seconds.     Coloration: Skin is not jaundiced or pale.     Findings: No bruising, erythema, lesion or rash.     Comments: Thinning hair throughout with patch of missing hair on R side of her scalp near the front  Neurological:     General: No focal deficit present.     Mental Status: She is alert and oriented to person, place, and time. Mental status is at baseline.  Psychiatric:        Mood and Affect: Mood normal.        Behavior: Behavior normal.        Thought Content: Thought content normal.        Judgment: Judgment normal.     Results for orders placed or performed during the hospital encounter of 01/03/24  [facility]Connect Molecular Screen - Blood ([facility])   Collection Time: 01/03/24  9:04 AM  Result Value Ref Range   Genetic Analysis Overall Interpretation Negative    Genetic Disease Assessed      This is a screening test and does not detect all pathogenic or likely  pathogenic variant(s) in the tested genes; diagnostic testing is recommended for individuals with a personal or family history of heart disease or hereditary cancer. [employer] Tier One  Population Screen is a screening test that analyzes 11 genes related to hereditary breast and ovarian cancer (HBOC) syndrome, Lynch syndrome, and familial hypercholesterolemia. This test only reports clinically significant pathogenic and likely  pathogenic variants but does not report variants of uncertain significance (VUS). In addition, analysis of the PMS2 gene excludes exons 11-15, which overlap with a known pseudogene (PMS2CL).    Genetic Analysis Report      No pathogenic or likely pathogenic variants were detected in the genes analyzed by this test.Genetic test results should be interpreted in the context of an individual's personal medical and family history. Alteration to medical management is NOT  recommended based solely on this result. Clinical correlation is advised.Additional Considerations- This is a screening test; individuals may still carry pathogenic or likely pathogenic variant(s) in the tested genes that are not detected by this test.-  For individuals at risk for these or other related conditions based on factors including personal or family history, diagnostic testing is recommended.- The absence of pathogenic or likely pathogenic variant(s) in the analyzed genes, while reassuring,  does not eliminate the possibility of a hereditary condition; there are other variants and genes associated with heart disease and hereditary cancer that are not included in this test.    Genes Tested See Notes    Disclaimer See Notes    Sequencing Location See Notes    Interpretation Methods and Limitations See Notes       Assessment & Plan:   Problem List Items Addressed This Visit       Other   Anxiety disorder   Would like a second opinion with psychiatry. Referral placed today.       Relevant Orders    Ambulatory referral to Psychiatry   History of depression   Would like a second opinion with psychiatry. Referral placed today.       Relevant Orders   Ambulatory referral to Psychiatry   Attention deficit hyperactivity disorder   Would like a second opinion with psychiatry. Referral placed today.       Relevant Orders   Ambulatory referral to Psychiatry   At risk for long QT syndrome  Would like a second opinion with psychiatry. Referral placed today.       Relevant Orders   Ambulatory referral to Psychiatry   Schizoaffective disorder, bipolar type [facility])   Would like a second opinion with psychiatry. Referral placed today.       Relevant Orders   Ambulatory referral to Psychiatry   Psychogenic nonepileptic seizure   Would like a second opinion with psychiatry. Referral placed today.       Relevant Orders   Ambulatory referral to Psychiatry   Other Visit Diagnoses       Hair loss    -  Primary   Concern for vitamin deficiency/illness releated- but with patch, may be tinea. Will treat with ketoconazole and check labs. Will refer to derm if getting worse.   Relevant Orders   Comprehensive metabolic panel with GFR   CBC with Differential/Platelet   Thyroid  Panel With TSH   Magnesium    B12   Folate   VITAMIN D  25 Hydroxy (Vit-D Deficiency, Fractures)   Phosphorus   Ferritin   Iron Binding Cap (TIBC)([facility])   Vitamin B1     Tinea capitis       Will treat with ketoconazole. Call with any concerns.   Relevant Medications   ketoconazole (NIZORAL) 2 % shampoo (Start on 02/10/2024)        Follow up plan: Return in about 4 weeks (around 03/07/2024).

## 2024-02-11 ENCOUNTER — Ambulatory Visit: Payer: Self-pay | Admitting: Family Medicine

## 2024-02-11 MED ORDER — VITAMIN D (ERGOCALCIFEROL) 1.25 MG (50000 UNIT) PO CAPS: 50000.0000 [IU] | ORAL_CAPSULE | ORAL | 1 refills | Status: AC

## 2024-02-14 LAB — CBC WITH DIFFERENTIAL/PLATELET
Basophils Absolute: 0 x10E3/uL (ref 0.0–0.2)
Basos: 1 %
EOS (ABSOLUTE): 0.1 x10E3/uL (ref 0.0–0.4)
Eos: 2 %
Hematocrit: 37.5 % (ref 34.0–46.6)
Hemoglobin: 12.9 g/dL (ref 11.1–15.9)
Immature Grans (Abs): 0 x10E3/uL (ref 0.0–0.1)
Immature Granulocytes: 0 %
Lymphocytes Absolute: 2.5 x10E3/uL (ref 0.7–3.1)
Lymphs: 37 %
MCH: 33.1 pg — ABNORMAL HIGH (ref 26.6–33.0)
MCHC: 34.4 g/dL (ref 31.5–35.7)
MCV: 96 fL (ref 79–97)
Monocytes Absolute: 0.4 x10E3/uL (ref 0.1–0.9)
Monocytes: 5 %
Neutrophils Absolute: 3.9 x10E3/uL (ref 1.4–7.0)
Neutrophils: 55 %
Platelets: 153 x10E3/uL (ref 150–450)
RBC: 3.9 x10E6/uL (ref 3.77–5.28)
RDW: 12.5 % (ref 11.7–15.4)
WBC: 6.9 x10E3/uL (ref 3.4–10.8)

## 2024-02-14 LAB — IRON AND TIBC
Iron Saturation: 28 % (ref 15–55)
Iron: 64 ug/dL (ref 27–159)
Total Iron Binding Capacity: 231 ug/dL — ABNORMAL LOW (ref 250–450)
UIBC: 167 ug/dL (ref 131–425)

## 2024-02-14 LAB — COMPREHENSIVE METABOLIC PANEL WITH GFR
ALT: 10 IU/L (ref 0–32)
AST: 10 IU/L (ref 0–40)
Albumin: 4.7 g/dL (ref 3.9–4.9)
Alkaline Phosphatase: 51 IU/L (ref 41–116)
BUN/Creatinine Ratio: 15 (ref 9–23)
BUN: 14 mg/dL (ref 6–20)
Bilirubin Total: 0.4 mg/dL (ref 0.0–1.2)
CO2: 21 mmol/L (ref 20–29)
Calcium: 9 mg/dL (ref 8.7–10.2)
Chloride: 107 mmol/L — ABNORMAL HIGH (ref 96–106)
Creatinine, Ser: 0.91 mg/dL (ref 0.57–1.00)
Globulin, Total: 2.1 g/dL (ref 1.5–4.5)
Glucose: 88 mg/dL (ref 70–99)
Potassium: 3.6 mmol/L (ref 3.5–5.2)
Sodium: 139 mmol/L (ref 134–144)
Total Protein: 6.8 g/dL (ref 6.0–8.5)
eGFR: 85 mL/min/1.73 (ref >59)

## 2024-02-14 LAB — FOLATE: Folate: 4.1 ng/mL (ref >3.0)

## 2024-02-14 LAB — VITAMIN B1: Thiamine: 85.8 nmol/L (ref 66.5–200.0)

## 2024-02-14 LAB — VITAMIN D 25 HYDROXY (VIT D DEFICIENCY, FRACTURES): Vit D, 25-Hydroxy: 23.2 ng/mL — ABNORMAL LOW (ref 30.0–100.0)

## 2024-02-14 LAB — PHOSPHORUS: Phosphorus: 2.3 mg/dL — ABNORMAL LOW (ref 3.0–4.3)

## 2024-02-14 LAB — THYROID PANEL WITH TSH
Free Thyroxine Index: 1.7 (ref 1.2–4.9)
T3 Uptake Ratio: 26 % (ref 24–39)
T4, Total: 6.5 ug/dL (ref 4.5–12.0)
TSH: 1.24 u[IU]/mL (ref 0.450–4.500)

## 2024-02-14 LAB — FERRITIN: Ferritin: 304 ng/mL — ABNORMAL HIGH (ref 15–150)

## 2024-02-14 LAB — VITAMIN B12: Vitamin B-12: 478 pg/mL (ref 232–1245)

## 2024-02-14 LAB — MAGNESIUM: Magnesium: 2.1 mg/dL (ref 1.6–2.3)

## 2024-03-14 ENCOUNTER — Ambulatory Visit: Admitting: Family Medicine
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
## 2024-03-23 ENCOUNTER — Other Ambulatory Visit: Payer: Self-pay | Admitting: Psychiatry

## 2024-03-23 DIAGNOSIS — G4700 Insomnia, unspecified: Secondary | ICD-10-CM

## 2024-03-30 ENCOUNTER — Telehealth: Payer: Self-pay

## 2024-03-30 MED ORDER — ONDANSETRON 4 MG PO TBDP: 4.0000 mg | ORAL_TABLET | Freq: Three times a day (TID) | ORAL | 6 refills | Status: AC | PRN

## 2024-03-30 NOTE — Telephone Encounter (Signed)
 Copied from CRM [id number]. Topic: Clinical - Medication Question >> Mar 30, 2024 10:29 AM Avram MATSU wrote: Reason for CRM: patient would like to know if she can have her ondansetron  to be dissolvable tablets, please advise [phone]  [id number] [place], [place] - [address] AT [facility] OF [address] [place] [address] Phone: [phone] Fax: [phone]

## 2024-04-18 ENCOUNTER — Telehealth: Admitting: Psychiatry

## 2024-04-28 ENCOUNTER — Encounter: Payer: Self-pay | Admitting: Family Medicine

## 2024-04-28 DIAGNOSIS — L659 Nonscarring hair loss, unspecified: Secondary | ICD-10-CM

## 2024-05-03 ENCOUNTER — Encounter: Payer: Self-pay | Admitting: *Deleted

## 2024-05-03 ENCOUNTER — Other Ambulatory Visit: Payer: Self-pay

## 2024-05-03 ENCOUNTER — Telehealth: Payer: Self-pay

## 2024-05-03 ENCOUNTER — Ambulatory Visit: Payer: Self-pay

## 2024-05-03 ENCOUNTER — Emergency Department
Admission: EM | Admit: 2024-05-03 | Discharge: 2024-05-03 | Disposition: A | Discharge status: Home / Self Care | Attending: Emergency Medicine | Admitting: Emergency Medicine

## 2024-05-03 DIAGNOSIS — E876 Hypokalemia: Secondary | ICD-10-CM | POA: Insufficient documentation

## 2024-05-03 DIAGNOSIS — R42 Dizziness and giddiness: Secondary | ICD-10-CM | POA: Diagnosis present

## 2024-05-03 DIAGNOSIS — J45909 Unspecified asthma, uncomplicated: Secondary | ICD-10-CM | POA: Insufficient documentation

## 2024-05-03 LAB — CBC
HCT: 36.2 % (ref 36.0–46.0)
Hemoglobin: 12.9 g/dL (ref 12.0–15.0)
MCH: 32.9 pg (ref 26.0–34.0)
MCHC: 35.6 g/dL (ref 30.0–36.0)
MCV: 92.3 fL (ref 80.0–100.0)
Platelets: 144 K/uL — ABNORMAL LOW (ref 150–400)
RBC: 3.92 MIL/uL (ref 3.87–5.11)
RDW: 12.5 % (ref 11.5–15.5)
WBC: 7.9 K/uL (ref 4.0–10.5)
nRBC: 0 % (ref 0.0–0.2)

## 2024-05-03 LAB — BASIC METABOLIC PANEL WITH GFR
Anion gap: 13 (ref 5–15)
BUN: 20 mg/dL (ref 6–20)
CO2: 18 mmol/L — ABNORMAL LOW (ref 22–32)
Calcium: 8.8 mg/dL — ABNORMAL LOW (ref 8.9–10.3)
Chloride: 106 mmol/L (ref 98–111)
Creatinine, Ser: 0.86 mg/dL (ref 0.44–1.00)
GFR, Estimated: >60 mL/min
Glucose, Bld: 122 mg/dL — ABNORMAL HIGH (ref 70–99)
Potassium: 3.3 mmol/L — ABNORMAL LOW (ref 3.5–5.1)
Sodium: 137 mmol/L (ref 135–145)

## 2024-05-03 MED ORDER — POTASSIUM CHLORIDE CRYS ER 20 MEQ PO TBCR
40.0000 meq | EXTENDED_RELEASE_TABLET | Freq: Once | ORAL | Status: AC
  Administered 2024-05-03: 40 meq via ORAL
  Filled 2024-05-03: qty 2

## 2024-05-03 NOTE — Telephone Encounter (Signed)
 Copied from CRM [id number]. Topic: General - Other >> May 03, 2024 12:49 PM Shanda MATSU wrote: Reason for CRM: Patient is wanting to know if provider can write her a letter excusing her from work due to dizziness that has started up again, patient is req a call back in regards to this.

## 2024-05-03 NOTE — ED Notes (Signed)
 Pt given DC instructions. Pt verbalized understanding of follow up care. Pt ambulatory from ED without difficulty.

## 2024-05-03 NOTE — ED Triage Notes (Signed)
 Pt ambulatory to triage.  Pt has dizziness for 11 months. Pt treated by ENT's.  Pt states dizziness yesterday morning is worse.   No headache.  No n/v/d  pt alert  speech clear.

## 2024-05-03 NOTE — Telephone Encounter (Signed)
 FYI Only or Action Required?: FYI only for provider: ED advised.  Patient was last seen in primary care on 02/08/2024 by Vicci Duwaine SQUIBB, DO.  Called Nurse Triage reporting Dizziness.  Symptoms began several days ago.  Interventions attempted: Rest, hydration, or home remedies.  Symptoms are: gradually worsening.  Triage Disposition: Go to ED Now (Notify PCP)  Patient/caregiver understands and will follow disposition?: Yes  Copied from CRM [id number]. Topic: Clinical - Red Word Triage >> May 03, 2024 12:48 PM Shanda MATSU wrote: Red Word that prompted transfer to Nurse Triage: Patient is reporting dizziness that started 2 days ago. Reason for Disposition  Loss of vision or double vision  (Exception: Similar to previous migraines.)  Answer Assessment - Initial Assessment Questions Patient presenting with days of a floaty off balance sensation, bad headaches, and eyes feeling like they are going cross-eyed not tracking.  History of PNES/seizures- 3 episodes yesterday on the drive home where she felt spaced out and came back to her body. EEG in the past to rule out POTS but never for her potential absence seizures.   Similar episodes last Feb however her vision did not do this.   Vision feels like it is trying to merge, goes double, then tries to merge together giving her off balance and bad headaches.   Advised ED to rule out posterior circulation CVA, complex migraine, versus seizures.   1. DESCRIPTION: Describe your dizziness.     Floaty, light headed 2. LIGHTHEADED: Do you feel lightheaded? (e.g., somewhat faint, woozy, weak upon standing)     Floaty woozy 3. VERTIGO: Do you feel like either you or the room is spinning or tilting? (i.e., vertigo)     denies 4. SEVERITY: How bad is it?  Do you feel like you are going to faint? Can you stand and walk?     Can't stand well  5. ONSET:  When did the dizziness begin?     2 days 6. AGGRAVATING FACTORS: Does anything make  it worse? (e.g., standing, change in head position)     UTA 7. HEART RATE: Can you tell me your heart rate? How many beats in 15 seconds?  (Note: Not all patients can do this.)       UTA 8. CAUSE: What do you think is causing the dizziness? (e.g., decreased fluids or food, diarrhea, emotional distress, heat exposure, new medicine, sudden standing, vomiting; unknown)     UTA 9. RECURRENT SYMPTOM: Have you had dizziness before? If Yes, ask: When was the last time? What happened that time?     Last Feb similar but vision is a change  10. OTHER SYMPTOMS: Do you have any other symptoms? (e.g., fever, chest pain, vomiting, diarrhea, bleeding)       UTA 11. PREGNANCY: Is there any chance you are pregnant? When was your last menstrual period?       denies  Protocols used: Dizziness - Lightheadedness-A-AH

## 2024-05-03 NOTE — ED Provider Notes (Signed)
 "  [facility] Provider Note    Event Date/Time   First MD Initiated Contact with Patient 05/03/24 1928     (approximate)   History   Chief Complaint: Dizziness   HPI  Stacy Taylor is a 34 y.o. female with history of anxiety, depression, chronic dizziness who comes ED complaining of increased dizziness.  She has increased her meclizine  use because of that, but now also noticing some blurriness in her vision.  No headache, no other pain.  No chest pain shortness of breath or fever.  No trauma, no motor weakness or paresthesias or change in balance or coordination.  Called her doctor for a visit, was told to come to the ED instead.  Outside records reviewed noting that she has had extensive workup in the past including MRI brain, labs, specialist follow-up.        Past Medical History:  Diagnosis Date   Anxiety    Asthma    Depression    Fetal alcohol syndrome    Heart murmur    Migraines    Osteoporosis    Scoliosis    Seizures [facility])     Current Outpatient Rx   Order #: [id number] Class: Normal   Order #: [id number] Class: Normal   Order #: [id number] Class: Normal   Order #: [id number] Class: Normal   Order #: [id number] Class: Historical Med   Order #: [id number] Class: No Print   Order #: [id number] Class: Normal   Order #: [id number] Class: Historical Med   Order #: [id number] Class: Normal   Order #: [id number] Class: Normal   Order #: [id number] Class: Historical Med   Order #: [id number] Class: Historical Med   Order #: [id number] Class: Normal   Order #: [id number] Class: Normal   Order #: [id number] Class: Normal   Order #: [id number] Class: Historical Med   Order #: [id number] Class: Normal   Order #: [id number] Class: Normal   Order #: [id number] Class: Historical Med   Order #: [id number] Class: Historical Med    Past Surgical History:  Procedure Laterality Date   FRACTURE SURGERY     HAND SURGERY     TONSILECTOMY, ADENOIDECTOMY, BILATERAL MYRINGOTOMY AND  TUBES     WRIST SURGERY      Physical Exam   Triage Vital Signs: ED Triage Vitals  Encounter Vitals Group     BP 05/03/24 1734 116/75     Girls Systolic BP Percentile --      Girls Diastolic BP Percentile --      Boys Systolic BP Percentile --      Boys Diastolic BP Percentile --      Pulse Rate 05/03/24 1734 91     Resp 05/03/24 1734 18     Temp 05/03/24 1734 98.8 F (37.1 C)     Temp Source 05/03/24 1734 Oral     SpO2 05/03/24 1734 100 %     Weight 05/03/24 1732 88 lb (39.9 kg)     Height 05/03/24 1732 4' 7 (1.397 m)     Head Circumference --      Peak Flow --      Pain Score 05/03/24 1732 0     Pain Loc --      Pain Education --      Exclude from Growth Chart --     Most recent vital signs: Vitals:   05/03/24 1734 05/03/24 1920  BP: 116/75   Pulse: 91   Resp: 18   Temp: 98.8 F (37.1 C)   SpO2: 100% 100%    General: Awake, no distress.  CV:  Good peripheral perfusion.  Regular rate rhythm Resp:  Normal effort.  Clear lungs Abd:  No distention.  Soft nontender Other:  Pupils symmetric and dilated, reactive.  Cranial nerves III through XII normal.  Neuro intact.   ED Results / Procedures / Treatments   Labs (all labs ordered are listed, but only abnormal results are displayed) Labs Reviewed  BASIC METABOLIC PANEL WITH GFR - Abnormal; Notable for the following components:      Result Value   Potassium 3.3 (*)    CO2 18 (*)    Glucose, Bld 122 (*)    Calcium 8.8 (*)    All other components within normal limits  CBC - Abnormal; Notable for the following components:   Platelets 144 (*)    All other components within normal limits     EKG    RADIOLOGY    PROCEDURES:  Procedures   MEDICATIONS ORDERED IN ED: Medications  potassium chloride  SA (KLOR-CON  M) CR tablet 40 mEq (40 mEq Oral Given 05/03/24 1958)     IMPRESSION / MDM / ASSESSMENT AND PLAN / ED COURSE  I reviewed the triage vital signs and the nursing notes.  DDx:  Anticholinergic side effects, electrolyte t derangement, AKI, dehydration, anemia  Patient's presentation is most consistent with acute presentation with potential threat to life or bodily function.     Clinical Course as of 05/03/24 2305  Wed May 03, 2024  1949 P/w increase in chronic dizziness sx that have undergone extensive workup with ENT, audiology, neuro. Sees psych as well. Compliant with meds. Wanted to see her doctor, was directed to ED by nurse triage hotline. 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 Doubt CVA, ICH, infection, IIH, glaucoma. Stable for DC. [PS]    Clinical Course User Index [PS] Viviann Pastor, MD     FINAL CLINICAL IMPRESSION(S) / ED DIAGNOSES   Final diagnoses:  Dizziness     Rx / DC Orders   ED Discharge Orders     None        Note:  This document was prepared using Dragon voice recognition software and may include unintentional dictation errors.   Viviann Pastor, MD 05/03/24 2305  "

## 2024-05-03 NOTE — Telephone Encounter (Signed)
 Is this a letter you can provide for the patient?

## 2024-05-04 ENCOUNTER — Telehealth: Payer: Self-pay | Admitting: Family Medicine

## 2024-05-04 ENCOUNTER — Encounter: Payer: Self-pay | Admitting: Family Medicine

## 2024-05-04 NOTE — Telephone Encounter (Signed)
 Ok to add patient in a same day spot?

## 2024-05-04 NOTE — Telephone Encounter (Signed)
 I'm not sure what I have sooner and I cannot double book this. Please put her on a cancellation list

## 2024-05-04 NOTE — Telephone Encounter (Signed)
 Added  to waitlist

## 2024-05-04 NOTE — Telephone Encounter (Signed)
 Copied from CRM #[id number]. Topic: Appointments - Scheduling Inquiry for Clinic >> May 04, 2024  8:23 AM Stacy Taylor wrote: Reason for CRM: Patient called in stated she was in the ER last night , needs to get medication changed as soon as possible, first avaliable appointment  is 01/22 needs to be seen sooner, would like a callback if she can be seen sooner

## 2024-05-04 NOTE — Telephone Encounter (Signed)
 See message from Dr. Vicci.

## 2024-05-04 NOTE — Telephone Encounter (Signed)
 Same day spot?

## 2024-05-05 ENCOUNTER — Ambulatory Visit: Admitting: Family Medicine

## 2024-05-05 ENCOUNTER — Encounter: Payer: Self-pay | Admitting: Family Medicine

## 2024-05-05 VITALS — BP 92/60 | HR 105 | Temp 98.0°F | Ht <= 58 in | Wt 89.0 lb

## 2024-05-05 DIAGNOSIS — R42 Dizziness and giddiness: Secondary | ICD-10-CM | POA: Diagnosis not present

## 2024-05-05 DIAGNOSIS — E876 Hypokalemia: Secondary | ICD-10-CM

## 2024-05-05 MED ORDER — SCOPOLAMINE 1 MG/3DAYS TD PT72: 1.0000 | MEDICATED_PATCH | TRANSDERMAL | 0 refills | Status: AC

## 2024-05-05 NOTE — Assessment & Plan Note (Signed)
 Acting up again. Was concerned about an absence seizure. Will get her into see her neurologist sooner. Will check her potassium. Will start scopolamine . Hold meclizine . Call with any concerns. Recheck 1-4 weeks.

## 2024-05-05 NOTE — Progress Notes (Signed)
 "  BP 92/60   Pulse (!) 105   Temp 98 F (36.7 C) (Oral)   Ht 4' 7 (1.397 m)   Wt 89 lb (40.4 kg)   LMP  (LMP Unknown)   SpO2 98%   BMI 20.69 kg/m    Subjective:    Patient ID: Stacy Taylor, female    DOB: 1991-01-12, 34 y.o.   MRN: [id number]  HPI: Stacy Taylor is a 34 y.o. female  Chief Complaint  Patient presents with   Dizziness    Patient states she has been having an increase in dizziness in the last few days. States she has been having issues with her vision when she has dizziness. States she experiences double vision and cross eyed. 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 States she has also had a loss of appetite.    DIZZINESS- dizziness came on really suddenly a couple of days ago. She went to the ER and had a normal work up. She notes that she has not had any change in the past couple of days. She does note that she never got back to being able to do stairs after her last bout with the dizziness. She notes that she got a promotion at work and has significantly more work. She notes that she has been having more problems with sleep and having less of an appetite. She has been losing weight. She notes that she has been having more issues seeing. She has an appointment with ophthalmology on Monday and has a follow up with neurology in March. Had an episode when she was driving where she wasn't sure where she was and she was concerned that she had a seizure. Did not lose consciousness.   Duration: chronic but worse in the past couple of days Description of symptoms: lightheaded Duration of episode: constant Dizziness frequency: recurrent Provoking factors: none Aggravating factors:  unknown Triggered by rolling over in bed: no Triggered by bending over: no Aggravated by head movement: no Aggravated by exertion, coughing, loud noises: no Recent head injury:  no Recent or current viral symptoms: no History of vasovagal episodes: no Nausea: no Vomiting: no Tinnitus: no Hearing loss: no Aural fullness: no Headache: no Photophobia/phonophobia: no Unsteady gait: yes Postural instability: yes Diplopia, dysarthria, dysphagia or weakness: yes Related to exertion: yes Pallor: no Diaphoresis: no Dyspnea: no Chest pain: no  Relevant past medical, surgical, family and social history reviewed and updated as indicated. Interim medical history since our last visit reviewed. Allergies and medications reviewed and updated.  Review of Systems  Constitutional:  Positive for fatigue. Negative for activity change, appetite change, chills, diaphoresis, fever and unexpected weight change.  HENT: Negative.    Eyes:  Positive for visual disturbance. Negative for photophobia, pain, discharge, redness and itching.  Respiratory: Negative.    Cardiovascular: Negative.   Musculoskeletal: Negative.   Neurological:  Positive for dizziness and light-headedness. Negative for tremors, seizures, syncope, facial asymmetry, speech difficulty, weakness, numbness and headaches.  Psychiatric/Behavioral: Negative.      Per HPI unless specifically indicated above     Objective:    BP 92/60   Pulse (!) 105   Temp 98 F (36.7 C) (Oral)   Ht 4' 7 (1.397 m)   Wt 89 lb (40.4 kg)   LMP  (LMP Unknown)   SpO2 98%   BMI 20.69 kg/m   Wt  Readings from Last 3 Encounters:  05/05/24 89 lb (40.4 kg)  05/03/24 88 lb (39.9 kg)  02/08/24 92 lb 3.2 oz (41.8 kg)    Physical Exam Vitals and nursing note reviewed.  Constitutional:      General: She is not in acute distress.    Appearance: Normal appearance. She is not ill-appearing, toxic-appearing or diaphoretic.  HENT:     Head: Normocephalic and atraumatic.     Right Ear: External ear normal.     Left Ear: External ear normal.     Nose: Nose normal.     Mouth/Throat:     Mouth: Mucous membranes are moist.      Pharynx: Oropharynx is clear.  Eyes:     General: No scleral icterus.       Right eye: No discharge.        Left eye: No discharge.     Extraocular Movements: Extraocular movements intact.     Conjunctiva/sclera: Conjunctivae normal.     Pupils: Pupils are equal, round, and reactive to light.  Cardiovascular:     Rate and Rhythm: Normal rate and regular rhythm.     Pulses: Normal pulses.     Heart sounds: Normal heart sounds. No murmur heard.    No friction rub. No gallop.  Pulmonary:     Effort: Pulmonary effort is normal. No respiratory distress.     Breath sounds: Normal breath sounds. No stridor. No wheezing, rhonchi or rales.  Chest:     Chest wall: No tenderness.  Musculoskeletal:        General: Normal range of motion.     Cervical back: Normal range of motion and neck supple.  Skin:    General: Skin is warm and dry.     Capillary Refill: Capillary refill takes less than 2 seconds.     Coloration: Skin is not jaundiced or pale.     Findings: No bruising, erythema, lesion or rash.  Neurological:     General: No focal deficit present.     Mental Status: She is alert and oriented to person, place, and time. Mental status is at baseline.  Psychiatric:        Mood and Affect: Mood normal.        Behavior: Behavior normal.        Thought Content: Thought content normal.        Judgment: Judgment normal.     Results for orders placed or performed during the hospital encounter of 05/03/24  Basic metabolic panel   Collection Time: 05/03/24  5:36 PM  Result Value Ref Range   Sodium 137 135 - 145 mmol/L   Potassium 3.3 (L) 3.5 - 5.1 mmol/L   Chloride 106 98 - 111 mmol/L   CO2 18 (L) 22 - 32 mmol/L   Glucose, Bld 122 (H) 70 - 99 mg/dL   BUN 20 6 - 20 mg/dL   Creatinine, Ser 9.13 0.44 - 1.00 mg/dL   Calcium 8.8 (L) 8.9 - 10.3 mg/dL   GFR, Estimated >39 >39 mL/min   Anion gap 13 5 - 15  CBC   Collection Time: 05/03/24  5:36 PM  Result Value Ref Range   WBC 7.9 4.0 -  10.5 K/uL   RBC 3.92 3.87 - 5.11 MIL/uL   Hemoglobin 12.9 12.0 - 15.0 g/dL   HCT 63.7 63.9 - 53.9 %   MCV 92.3 80.0 - 100.0 fL   MCH 32.9 26.0 - 34.0 pg   MCHC 35.6 30.0 -  36.0 g/dL   RDW 87.4 88.4 - 84.4 %   Platelets 144 (L) 150 - 400 K/uL   nRBC 0.0 0.0 - 0.2 %      Assessment & Plan:   Problem List Items Addressed This Visit       Other   Dizziness - Primary   Acting up again. Was concerned about an absence seizure. Will get her into see her neurologist sooner. Will check her potassium. Will start scopolamine . Hold meclizine . Call with any concerns. Recheck 1-4 weeks.       Other Visit Diagnoses       Hypokalemia       Rechecking labs today. Await results. Treat as needed.   Relevant Orders   Basic metabolic panel with GFR        Follow up plan: Return in about 4 weeks (around 06/02/2024).   25 minutes spent with patient today   "

## 2024-05-06 LAB — BASIC METABOLIC PANEL WITH GFR
BUN/Creatinine Ratio: 19 (ref 9–23)
BUN: 17 mg/dL (ref 6–20)
CO2: 18 mmol/L — ABNORMAL LOW (ref 20–29)
Calcium: 9.2 mg/dL (ref 8.7–10.2)
Chloride: 105 mmol/L (ref 96–106)
Creatinine, Ser: 0.89 mg/dL (ref 0.57–1.00)
Glucose: 77 mg/dL (ref 70–99)
Potassium: 3.6 mmol/L (ref 3.5–5.2)
Sodium: 136 mmol/L (ref 134–144)
eGFR: 87 mL/min/1.73

## 2024-05-08 ENCOUNTER — Ambulatory Visit: Payer: Self-pay | Admitting: Family Medicine

## 2024-05-09 ENCOUNTER — Encounter: Payer: Self-pay | Admitting: Family Medicine

## 2024-05-11 ENCOUNTER — Telehealth: Payer: Self-pay | Admitting: Family Medicine

## 2024-05-11 NOTE — Telephone Encounter (Signed)
 Patient dropped off Accomodation Leave Form  to be filled out by provider. Patient is requesting a call back at [phone] within 2-5 days when completed. Document is located in providers folder.

## 2024-05-12 ENCOUNTER — Ambulatory Visit: Admitting: Family Medicine

## 2024-05-12 ENCOUNTER — Encounter: Payer: Self-pay | Admitting: Family Medicine

## 2024-05-12 VITALS — BP 95/59 | HR 103 | Temp 97.6°F | Ht <= 58 in | Wt 91.6 lb

## 2024-05-12 DIAGNOSIS — H6122 Impacted cerumen, left ear: Secondary | ICD-10-CM | POA: Diagnosis not present

## 2024-05-12 DIAGNOSIS — F909 Attention-deficit hyperactivity disorder, unspecified type: Secondary | ICD-10-CM

## 2024-05-12 DIAGNOSIS — H6992 Unspecified Eustachian tube disorder, left ear: Secondary | ICD-10-CM

## 2024-05-12 DIAGNOSIS — Z8659 Personal history of other mental and behavioral disorders: Secondary | ICD-10-CM | POA: Diagnosis not present

## 2024-05-12 DIAGNOSIS — F250 Schizoaffective disorder, bipolar type: Secondary | ICD-10-CM | POA: Diagnosis not present

## 2024-05-12 DIAGNOSIS — R42 Dizziness and giddiness: Secondary | ICD-10-CM

## 2024-05-12 DIAGNOSIS — F419 Anxiety disorder, unspecified: Secondary | ICD-10-CM

## 2024-05-12 MED ORDER — PREDNISONE 50 MG PO TABS: 50.0000 mg | ORAL_TABLET | Freq: Every day | ORAL | 0 refills | Status: AC

## 2024-05-12 NOTE — Assessment & Plan Note (Signed)
 Would like to get 2nd opinion with psychiatry. New referral placed today.

## 2024-05-12 NOTE — Progress Notes (Signed)
 "  BP (!) 95/59   Pulse (!) 103   Temp 97.6 F (36.4 C) (Other (Comment))   Ht 4' 7 (1.397 m)   Wt 91 lb 9.6 oz (41.5 kg)   LMP  (LMP Unknown)   SpO2 99%   BMI 21.29 kg/m    Subjective:    Patient ID: Stacy Taylor, female    DOB: 02-18-1991, 34 y.o.   MRN: [id number]  HPI: Stacy Taylor is a 34 y.o. female  Chief Complaint  Patient presents with   Dizziness   She notes that she is feeling worse. She notes that she is now cane bound. Her dizziness has increased to where she doesn't feel like she can walk safely. Has short term disability paperwork.  EAG CLOGGED Duration: about a day Involved ear(s):  right Sensation of feeling clogged/plugged: yes Decreased/muffled hearing:yes Ear pain: no Fever: no Otorrhea: no Hearing loss: yes Upper respiratory infection symptoms: no Using Q-Tips: no Status: worse History of cerumenosis: yes Treatments attempted: none   Relevant past medical, surgical, family and social history reviewed and updated as indicated. Interim medical history since our last visit reviewed. Allergies and medications reviewed and updated.  Review of Systems  Constitutional:  Positive for fatigue. Negative for activity change, appetite change, chills, diaphoresis, fever and unexpected weight change.  Respiratory: Negative.    Cardiovascular: Negative.   Musculoskeletal: Negative.   Neurological:  Positive for dizziness, weakness and light-headedness. Negative for tremors, seizures, syncope, facial asymmetry, speech difficulty, numbness and headaches.  Psychiatric/Behavioral: Negative.      Per HPI unless specifically indicated above     Objective:    BP (!) 95/59   Pulse (!) 103   Temp 97.6 F (36.4 C) (Other (Comment))   Ht 4' 7 (1.397 m)   Wt 91 lb 9.6 oz (41.5 kg)   LMP  (LMP Unknown)   SpO2 99%   BMI 21.29 kg/m   Wt Readings from Last 3 Encounters:  05/12/24 91 lb 9.6 oz (41.5 kg)  05/05/24 89 lb (40.4 kg)  05/03/24 88 lb  (39.9 kg)    Physical Exam Vitals and nursing note reviewed.  Constitutional:      General: She is not in acute distress.    Appearance: Normal appearance. She is not ill-appearing, toxic-appearing or diaphoretic.  HENT:     Head: Normocephalic and atraumatic.     Right Ear: Tympanic membrane, ear canal and external ear normal.     Left Ear: Tympanic membrane, ear canal and external ear normal. There is impacted cerumen.     Nose: Nose normal.     Mouth/Throat:     Mouth: Mucous membranes are moist.     Pharynx: Oropharynx is clear.  Eyes:     General: No scleral icterus.       Right eye: No discharge.        Left eye: No discharge.     Extraocular Movements: Extraocular movements intact.     Conjunctiva/sclera: Conjunctivae normal.     Pupils: Pupils are equal, round, and reactive to light.  Cardiovascular:     Rate and Rhythm: Normal rate and regular rhythm.     Pulses: Normal pulses.     Heart sounds: Normal heart sounds. No murmur heard.    No friction rub. No gallop.  Pulmonary:     Effort: Pulmonary effort is normal. No respiratory distress.     Breath sounds: Normal breath sounds. No stridor. No wheezing, rhonchi or rales.  Chest:  Chest wall: No tenderness.  Musculoskeletal:        General: Normal range of motion.     Cervical back: Normal range of motion and neck supple.  Skin:    General: Skin is warm and dry.     Capillary Refill: Capillary refill takes less than 2 seconds.     Coloration: Skin is not jaundiced or pale.     Findings: No bruising, erythema, lesion or rash.  Neurological:     General: No focal deficit present.     Mental Status: She is alert and oriented to person, place, and time. Mental status is at baseline.  Psychiatric:        Mood and Affect: Mood normal.        Behavior: Behavior normal.        Thought Content: Thought content normal.        Judgment: Judgment normal.     Results for orders placed or performed in visit on 05/05/24   Basic metabolic panel with GFR   Collection Time: 05/05/24  3:41 PM  Result Value Ref Range   Glucose 77 70 - 99 mg/dL   BUN 17 6 - 20 mg/dL   Creatinine, Ser 9.10 0.57 - 1.00 mg/dL   eGFR 87 >40 fO/fpw/8.26   BUN/Creatinine Ratio 19 9 - 23   Sodium 136 134 - 144 mmol/L   Potassium 3.6 3.5 - 5.2 mmol/L   Chloride 105 96 - 106 mmol/L   CO2 18 (L) 20 - 29 mmol/L   Calcium 9.2 8.7 - 10.2 mg/dL      Assessment & Plan:   Problem List Items Addressed This Visit       Other   Anxiety disorder   Would like to get 2nd opinion with psychiatry. New referral placed today.       Relevant Orders   Ambulatory referral to Psychiatry   History of depression   Would like to get 2nd opinion with psychiatry. New referral placed today.       Relevant Orders   Ambulatory referral to Psychiatry   Attention deficit hyperactivity disorder   Would like to get 2nd opinion with psychiatry. New referral placed today.       Relevant Orders   Ambulatory referral to Psychiatry   Schizoaffective disorder, bipolar type [facility])   Would like to get 2nd opinion with psychiatry. New referral placed today.       Relevant Orders   Ambulatory referral to Psychiatry   Dizziness - Primary   Worsening. Now having to walk with a cane. Cannot get into see neurology until March- will monitor symptoms. Had extensive work up when this happened last year and it spontaneously resolved in the summer. Symptoms started almost exact same time last year with no other factors, concerned that there may be an allergic component. Will get her into allergy. Referral placed. Short term disability paperwork will be filled out.       Relevant Orders   Ambulatory referral to Allergy   Other Visit Diagnoses       ETD (Eustachian tube dysfunction), left       Ear feels clogged. Will treat with prednisone  burst. Call with any concerns.     Hearing loss due to cerumen impaction, left       Removed today with good results.         Follow up plan: Return for As scheduled.      "

## 2024-05-12 NOTE — Assessment & Plan Note (Signed)
 Worsening. Now having to walk with a cane. Cannot get into see neurology until March- will monitor symptoms. Had extensive work up when this happened last year and it spontaneously resolved in the summer. Symptoms started almost exact same time last year with no other factors, concerned that there may be an allergic component. Will get her into allergy. Referral placed. Short term disability paperwork will be filled out.

## 2024-05-15 ENCOUNTER — Ambulatory Visit

## 2024-05-15 DIAGNOSIS — L219 Seborrheic dermatitis, unspecified: Secondary | ICD-10-CM

## 2024-05-15 DIAGNOSIS — L650 Telogen effluvium: Secondary | ICD-10-CM

## 2024-05-15 NOTE — Progress Notes (Signed)
" °  °  Subjective   Stacy Taylor is a 34 y.o. female who presents for the following: Hair loss. Patient is new patient  Today patient reports: Hair loss worsening x2 months. Has used OTC Keratin and Hair skin and nail supplements. Her PCP prescribed Ketoconazole  shampoo.   Does report intermittent episodes of dizziness over past year. One of which caused her to be out of work for several weeks.   Review of Systems:    No other skin or systemic complaints except as noted in HPI or Assessment and Plan.  The following portions of the chart were reviewed this encounter and updated as appropriate: medications, allergies, medical history  Relevant Medical History:  Schizoaffective disorder Dizziness   Objective  (SKPE) Well appearing patient in no apparent distress; mood and affect are within normal limits. 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 - Avoid over-vigorous combing, brushing and any type of scalp massage - Recommend OTC Minoxidil 5% topically twice daily. Educated about proper use, including ensuring the medication is applied directly to the scalp. Discusssed that minoxidil can initially cause some hair shedding. Reviewed that expectation and goal of treatment is to prevent further loss, rather than hair regrowth.  -  Reviewed/interpreted recent labs labs: CBC, B1, Iron, Ferritin, Vitamin D , Folate, B12, TSH - wnl except for known vit D deficiency  - cont supplementation as can contribute to hair loss   Seborrheic Dermatitis - Continue ketoconazole  shampoo three times weekly     Was sun protection counseling provided?: No   Level of service outlined above   Patient instructions (SKPI)   Procedures, orders, diagnosis for this visit:    There are no diagnoses linked to this encounter.  Return to clinic: Return in about 6 months (around 11/12/2024) for Hair loss.  I, Emerick Ege, CMA am acting as scribe for Lauraine JAYSON Kanaris, MD.   Documentation: I have reviewed the above documentation for accuracy and completeness, and I agree with the above.  Lauraine JAYSON Kanaris, MD  "

## 2024-05-15 NOTE — Patient Instructions (Addendum)
 Begin Minoxidil 5% foam (or solution) otherwise known as Rogaine - this is often called Mens Strength - but we use it exclusively in men and women. You are going to apply this once or twice daily in areas where you feel are thinner than the remainder.  It can take up to 6-8 months of applying this medication to notice an improvement.  You will need to use this long term to maintain the results of increased hair growth.       Due to recent changes in healthcare laws, you may see results of your pathology and/or laboratory studies on MyChart before the doctors have had a chance to review them. We understand that in some cases there may be results that are confusing or concerning to you. Please understand that not all results are received at the same time and often the doctors may need to interpret multiple results in order to provide you with the best plan of care or course of treatment. Therefore, we ask that you please give us  2 business days to thoroughly review all your results before contacting the office for clarification. Should we see a critical lab result, you will be contacted sooner.   If You Need Anything After Your Visit  If you have any questions or concerns for your doctor, please call our main line at [phone] and press option 4 to reach your doctor's medical assistant. If no one answers, please leave a voicemail as directed and we will return your call as soon as possible. Messages left after 4 pm will be answered the following business day.   You may also send us  a message via MyChart. We typically respond to MyChart messages within 1-2 business days.  For prescription refills, please ask your pharmacy to contact our office. Our fax number is [phone].  If you have an urgent issue when the clinic is closed that cannot wait until the next business day, you can page your doctor at the number below.    Please note that while we do our best to be available for urgent issues  outside of office hours, we are not available 24/7.   If you have an urgent issue and are unable to reach us , you may choose to seek medical care at your doctor's office, retail clinic, urgent care center, or emergency room.  If you have a medical emergency, please immediately call 911 or go to the emergency department.  Pager Numbers  - Dr. Hester: [phone]  - Dr. Jackquline: [phone]  - Dr. Claudene: [phone]   - Dr. Raymund: [phone]  In the event of inclement weather, please call our main line at [phone] for an update on the status of any delays or closures.  Dermatology Medication Tips: Please keep the boxes that topical medications come in in order to help keep track of the instructions about where and how to use these. Pharmacies typically print the medication instructions only on the boxes and not directly on the medication tubes.   If your medication is too expensive, please contact our office at [phone] option 4 or send us  a message through MyChart.   We are unable to tell what your co-pay for medications will be in advance as this is different depending on your insurance coverage. However, we may be able to find a substitute medication at lower cost or fill out paperwork to get insurance to cover a needed medication.   If a prior authorization is required to get your medication covered by your  insurance company, please allow us  1-2 business days to complete this process.  Drug prices often vary depending on where the prescription is filled and some pharmacies may offer cheaper prices.  The website www.goodrx.com contains coupons for medications through different pharmacies. The prices here do not account for what the cost may be with help from insurance (it may be cheaper with your insurance), but the website can give you the price if you did not use any insurance.  - You can print the associated coupon and take it with your prescription to the pharmacy.   - You may also stop by our office during regular business hours and pick up a GoodRx coupon card.  - If you need your prescription sent electronically to a different pharmacy, notify our office through [facility] or by phone at [phone] option 4.     Si Usted Necesita Algo Despus de Su Visita  Tambin puede enviarnos un mensaje a travs de Clinical cytogeneticist. Por lo general respondemos a los mensajes de MyChart en el transcurso de 1 a 2 das hbiles.  Para renovar recetas, por favor pida a su farmacia que se ponga en contacto con nuestra oficina. Randi lakes de fax es [place] [phone].  Si tiene un asunto urgente cuando la clnica est cerrada y que no puede esperar hasta el siguiente da hbil, puede llamar/localizar a su doctor(a) al nmero que aparece a continuacin.   Por favor, tenga en cuenta que aunque hacemos todo lo posible para estar disponibles para asuntos urgentes fuera del horario de [place], no estamos disponibles las 24 horas del da, los 7 [address]  [address] de la [place].   Si tiene un problema urgente y no puede comunicarse con nosotros, puede optar por buscar atencin mdica  en el consultorio de su doctor(a), en una clnica privada, en un centro de atencin urgente o en una sala de emergencias.  Si tiene Engineer, drilling, por favor llame inmediatamente al 911 o vaya a la sala de emergencias.  Nmeros de bper  - Dr. Hester: [phone]  - Dra. Jackquline: [phone]  - Dr. Claudene: [phone]  - Dra. Kitts: [phone]  En caso de inclemencias del [place], por favor llame a nuestra lnea principal al [phone] para una actualizacin sobre el estado de cualquier retraso o cierre.  Consejos para la medicacin en dermatologa: Por favor, guarde las cajas en las que vienen los medicamentos de uso tpico para ayudarle a seguir las instrucciones sobre dnde y cmo usarlos. Las farmacias generalmente imprimen las instrucciones del medicamento slo en las cajas y no  directamente en los tubos del [place].   Si su medicamento es muy caro, por favor, pngase en contacto con landry rieger llamando al [phone] y presione la opcin 4 o envenos un mensaje a travs de Clinical cytogeneticist.   No podemos decirle cul ser su copago por los medicamentos por adelantado ya que esto es diferente dependiendo de la cobertura de su seguro. Sin embargo, es posible que podamos encontrar un medicamento sustituto a Audiological scientist un formulario para que el seguro cubra el medicamento que se considera necesario.   Si se requiere una autorizacin previa para que su compaa de seguros malta su medicamento, por favor permtanos de 1 a 2 das hbiles para completar este proceso.  Los precios de los medicamentos varan con frecuencia dependiendo del Environmental consultant de dnde se surte la receta y alguna farmacias pueden ofrecer precios ms baratos.  El sitio web www.goodrx.com tiene cupones para medicamentos de Health and safety inspector. Los  precios aqu no tienen en cuenta lo que podra costar con la ayuda del seguro (puede ser ms barato con su seguro), pero el sitio web puede darle el precio si no utiliz Tourist information centre manager.  - Puede imprimir el cupn correspondiente y llevarlo con su receta a la farmacia.  - Tambin puede pasar por nuestra oficina durante el horario de atencin regular y Education officer, museum una tarjeta de cupones de GoodRx.  - Si necesita que su receta se enve electrnicamente a una farmacia diferente, informe a nuestra oficina a travs de MyChart de [place] o por telfono llamando al [phone] y presione la opcin 4.

## 2024-05-19 NOTE — Telephone Encounter (Signed)
 Patient aware her paperwork is partially completed but we do not have the signature page. She will return it to our office ASAP and then can finish processing to PCP.

## 2024-05-23 ENCOUNTER — Encounter: Payer: Self-pay | Admitting: Family Medicine

## 2024-05-23 NOTE — Telephone Encounter (Signed)
 Patient just brought in signature page. Placing form in providers folder.

## 2024-05-23 NOTE — Telephone Encounter (Signed)
Pending provider review and signature

## 2024-05-23 NOTE — Telephone Encounter (Signed)
 On this date 05/23/24  paperwork has been received on patient's behalf. The paperwork has been received via fax to our office.. The paperwork that has been received is Short Term Disability (STD).   Paperwork to be returned via Fax.   Paperwork to be placed in providers incoming folder to be worked by their team. Jud is expected to be completed and returned 5-7 business days.

## 2024-05-23 NOTE — Telephone Encounter (Signed)
 This encounter was created in error - please disregard.

## 2024-05-26 ENCOUNTER — Encounter: Payer: Self-pay | Admitting: Family Medicine

## 2024-05-30 ENCOUNTER — Encounter: Payer: Self-pay | Admitting: Family Medicine

## 2024-06-05 ENCOUNTER — Ambulatory Visit: Admitting: Allergy

## 2024-06-06 ENCOUNTER — Ambulatory Visit: Admitting: Family Medicine

## 2024-06-13 ENCOUNTER — Ambulatory Visit: Admitting: Family Medicine

## 2024-06-15 ENCOUNTER — Ambulatory Visit: Admitting: Family Medicine

## 2024-06-20 ENCOUNTER — Ambulatory Visit: Admitting: Allergy

## 2024-06-22 ENCOUNTER — Ambulatory Visit

## 2024-11-13 ENCOUNTER — Ambulatory Visit
